# Patient Record
Sex: Male | Born: 1950
Health system: Southern US, Community
[De-identification: ages and names within clinical notes are randomized; demographics above are authoritative.]

## PROBLEM LIST (undated history)

## (undated) DIAGNOSIS — M1990 Unspecified osteoarthritis, unspecified site: Secondary | ICD-10-CM

## (undated) DIAGNOSIS — I10 Essential (primary) hypertension: Secondary | ICD-10-CM

## (undated) DIAGNOSIS — N189 Chronic kidney disease, unspecified: Secondary | ICD-10-CM

## (undated) HISTORY — PX: HAND LIGAMENT RECONSTRUCTION: SHX1726

## (undated) MED FILL — Fosaprepitant Dimeglumine For IV Infusion 150 MG (Base Eq): INTRAVENOUS | Qty: 5 | Status: AC

---

## 1996-08-29 DIAGNOSIS — I639 Cerebral infarction, unspecified: Secondary | ICD-10-CM

## 1996-08-29 HISTORY — DX: Cerebral infarction, unspecified: I63.9

## 2003-11-05 ENCOUNTER — Emergency Department (HOSPITAL_COMMUNITY): Admission: EM | Admit: 2003-11-05 | Discharge: 2003-11-05 | Payer: Self-pay | Admitting: Emergency Medicine

## 2018-02-06 DIAGNOSIS — N410 Acute prostatitis: Secondary | ICD-10-CM | POA: Diagnosis not present

## 2018-02-06 DIAGNOSIS — R300 Dysuria: Secondary | ICD-10-CM | POA: Diagnosis not present

## 2018-03-20 DIAGNOSIS — F5101 Primary insomnia: Secondary | ICD-10-CM | POA: Diagnosis not present

## 2018-03-20 DIAGNOSIS — N183 Chronic kidney disease, stage 3 (moderate): Secondary | ICD-10-CM | POA: Diagnosis not present

## 2018-03-20 DIAGNOSIS — E7800 Pure hypercholesterolemia, unspecified: Secondary | ICD-10-CM | POA: Diagnosis not present

## 2018-03-20 DIAGNOSIS — M19042 Primary osteoarthritis, left hand: Secondary | ICD-10-CM | POA: Diagnosis not present

## 2018-03-20 DIAGNOSIS — M19041 Primary osteoarthritis, right hand: Secondary | ICD-10-CM | POA: Diagnosis not present

## 2018-03-20 DIAGNOSIS — F419 Anxiety disorder, unspecified: Secondary | ICD-10-CM | POA: Diagnosis not present

## 2018-03-20 DIAGNOSIS — M10072 Idiopathic gout, left ankle and foot: Secondary | ICD-10-CM | POA: Diagnosis not present

## 2018-03-20 DIAGNOSIS — I10 Essential (primary) hypertension: Secondary | ICD-10-CM | POA: Diagnosis not present

## 2018-03-20 DIAGNOSIS — G8929 Other chronic pain: Secondary | ICD-10-CM | POA: Diagnosis not present

## 2018-05-16 DIAGNOSIS — M75122 Complete rotator cuff tear or rupture of left shoulder, not specified as traumatic: Secondary | ICD-10-CM | POA: Diagnosis not present

## 2018-06-13 DIAGNOSIS — G8929 Other chronic pain: Secondary | ICD-10-CM | POA: Diagnosis not present

## 2018-06-13 DIAGNOSIS — L739 Follicular disorder, unspecified: Secondary | ICD-10-CM | POA: Diagnosis not present

## 2018-06-13 DIAGNOSIS — F5101 Primary insomnia: Secondary | ICD-10-CM | POA: Diagnosis not present

## 2018-06-13 DIAGNOSIS — M545 Low back pain: Secondary | ICD-10-CM | POA: Diagnosis not present

## 2018-07-31 DIAGNOSIS — F5101 Primary insomnia: Secondary | ICD-10-CM | POA: Diagnosis not present

## 2018-07-31 DIAGNOSIS — I10 Essential (primary) hypertension: Secondary | ICD-10-CM | POA: Diagnosis not present

## 2018-08-29 HISTORY — PX: SHOULDER ARTHROSCOPY W/ ROTATOR CUFF REPAIR: SHX2400

## 2018-09-10 DIAGNOSIS — M7021 Olecranon bursitis, right elbow: Secondary | ICD-10-CM | POA: Diagnosis not present

## 2018-09-18 DIAGNOSIS — G8918 Other acute postprocedural pain: Secondary | ICD-10-CM | POA: Diagnosis not present

## 2018-09-18 DIAGNOSIS — M75122 Complete rotator cuff tear or rupture of left shoulder, not specified as traumatic: Secondary | ICD-10-CM | POA: Diagnosis not present

## 2018-09-18 DIAGNOSIS — M7542 Impingement syndrome of left shoulder: Secondary | ICD-10-CM | POA: Diagnosis not present

## 2018-09-18 DIAGNOSIS — S46192A Other injury of muscle, fascia and tendon of long head of biceps, left arm, initial encounter: Secondary | ICD-10-CM | POA: Diagnosis not present

## 2018-09-26 DIAGNOSIS — Z9889 Other specified postprocedural states: Secondary | ICD-10-CM | POA: Diagnosis not present

## 2018-09-26 DIAGNOSIS — M7542 Impingement syndrome of left shoulder: Secondary | ICD-10-CM | POA: Diagnosis not present

## 2018-09-27 DIAGNOSIS — G8929 Other chronic pain: Secondary | ICD-10-CM | POA: Diagnosis not present

## 2018-09-27 DIAGNOSIS — L03311 Cellulitis of abdominal wall: Secondary | ICD-10-CM | POA: Diagnosis not present

## 2018-09-27 DIAGNOSIS — F5101 Primary insomnia: Secondary | ICD-10-CM | POA: Diagnosis not present

## 2018-09-27 DIAGNOSIS — F419 Anxiety disorder, unspecified: Secondary | ICD-10-CM | POA: Diagnosis not present

## 2018-09-27 DIAGNOSIS — Z1211 Encounter for screening for malignant neoplasm of colon: Secondary | ICD-10-CM | POA: Diagnosis not present

## 2018-09-27 DIAGNOSIS — I10 Essential (primary) hypertension: Secondary | ICD-10-CM | POA: Diagnosis not present

## 2018-09-27 DIAGNOSIS — N183 Chronic kidney disease, stage 3 (moderate): Secondary | ICD-10-CM | POA: Diagnosis not present

## 2018-09-28 DIAGNOSIS — M75122 Complete rotator cuff tear or rupture of left shoulder, not specified as traumatic: Secondary | ICD-10-CM | POA: Diagnosis not present

## 2018-09-28 DIAGNOSIS — M25612 Stiffness of left shoulder, not elsewhere classified: Secondary | ICD-10-CM | POA: Diagnosis not present

## 2018-09-29 DIAGNOSIS — Z1211 Encounter for screening for malignant neoplasm of colon: Secondary | ICD-10-CM | POA: Diagnosis not present

## 2018-10-01 DIAGNOSIS — M25612 Stiffness of left shoulder, not elsewhere classified: Secondary | ICD-10-CM | POA: Diagnosis not present

## 2018-10-01 DIAGNOSIS — M75122 Complete rotator cuff tear or rupture of left shoulder, not specified as traumatic: Secondary | ICD-10-CM | POA: Diagnosis not present

## 2018-10-03 DIAGNOSIS — M25612 Stiffness of left shoulder, not elsewhere classified: Secondary | ICD-10-CM | POA: Diagnosis not present

## 2018-10-03 DIAGNOSIS — M75122 Complete rotator cuff tear or rupture of left shoulder, not specified as traumatic: Secondary | ICD-10-CM | POA: Diagnosis not present

## 2018-10-09 DIAGNOSIS — M75122 Complete rotator cuff tear or rupture of left shoulder, not specified as traumatic: Secondary | ICD-10-CM | POA: Diagnosis not present

## 2018-10-09 DIAGNOSIS — M25612 Stiffness of left shoulder, not elsewhere classified: Secondary | ICD-10-CM | POA: Diagnosis not present

## 2018-10-11 DIAGNOSIS — M25612 Stiffness of left shoulder, not elsewhere classified: Secondary | ICD-10-CM | POA: Diagnosis not present

## 2018-10-11 DIAGNOSIS — M75122 Complete rotator cuff tear or rupture of left shoulder, not specified as traumatic: Secondary | ICD-10-CM | POA: Diagnosis not present

## 2018-10-15 DIAGNOSIS — M75122 Complete rotator cuff tear or rupture of left shoulder, not specified as traumatic: Secondary | ICD-10-CM | POA: Diagnosis not present

## 2018-10-15 DIAGNOSIS — M25612 Stiffness of left shoulder, not elsewhere classified: Secondary | ICD-10-CM | POA: Diagnosis not present

## 2018-10-17 DIAGNOSIS — M25612 Stiffness of left shoulder, not elsewhere classified: Secondary | ICD-10-CM | POA: Diagnosis not present

## 2018-10-17 DIAGNOSIS — M75122 Complete rotator cuff tear or rupture of left shoulder, not specified as traumatic: Secondary | ICD-10-CM | POA: Diagnosis not present

## 2018-10-23 DIAGNOSIS — M25612 Stiffness of left shoulder, not elsewhere classified: Secondary | ICD-10-CM | POA: Diagnosis not present

## 2018-10-23 DIAGNOSIS — M75122 Complete rotator cuff tear or rupture of left shoulder, not specified as traumatic: Secondary | ICD-10-CM | POA: Diagnosis not present

## 2018-10-25 DIAGNOSIS — M25612 Stiffness of left shoulder, not elsewhere classified: Secondary | ICD-10-CM | POA: Diagnosis not present

## 2018-10-25 DIAGNOSIS — M75122 Complete rotator cuff tear or rupture of left shoulder, not specified as traumatic: Secondary | ICD-10-CM | POA: Diagnosis not present

## 2018-10-30 DIAGNOSIS — M75122 Complete rotator cuff tear or rupture of left shoulder, not specified as traumatic: Secondary | ICD-10-CM | POA: Diagnosis not present

## 2018-10-30 DIAGNOSIS — M25612 Stiffness of left shoulder, not elsewhere classified: Secondary | ICD-10-CM | POA: Diagnosis not present

## 2018-10-31 DIAGNOSIS — M25612 Stiffness of left shoulder, not elsewhere classified: Secondary | ICD-10-CM | POA: Diagnosis not present

## 2018-11-01 DIAGNOSIS — M75122 Complete rotator cuff tear or rupture of left shoulder, not specified as traumatic: Secondary | ICD-10-CM | POA: Diagnosis not present

## 2018-11-01 DIAGNOSIS — M25612 Stiffness of left shoulder, not elsewhere classified: Secondary | ICD-10-CM | POA: Diagnosis not present

## 2018-11-06 DIAGNOSIS — M25612 Stiffness of left shoulder, not elsewhere classified: Secondary | ICD-10-CM | POA: Diagnosis not present

## 2018-11-06 DIAGNOSIS — M75122 Complete rotator cuff tear or rupture of left shoulder, not specified as traumatic: Secondary | ICD-10-CM | POA: Diagnosis not present

## 2018-11-08 DIAGNOSIS — M75122 Complete rotator cuff tear or rupture of left shoulder, not specified as traumatic: Secondary | ICD-10-CM | POA: Diagnosis not present

## 2018-11-08 DIAGNOSIS — M25612 Stiffness of left shoulder, not elsewhere classified: Secondary | ICD-10-CM | POA: Diagnosis not present

## 2018-11-13 DIAGNOSIS — M75122 Complete rotator cuff tear or rupture of left shoulder, not specified as traumatic: Secondary | ICD-10-CM | POA: Diagnosis not present

## 2018-11-13 DIAGNOSIS — M25612 Stiffness of left shoulder, not elsewhere classified: Secondary | ICD-10-CM | POA: Diagnosis not present

## 2018-11-15 DIAGNOSIS — M75122 Complete rotator cuff tear or rupture of left shoulder, not specified as traumatic: Secondary | ICD-10-CM | POA: Diagnosis not present

## 2018-11-15 DIAGNOSIS — M25612 Stiffness of left shoulder, not elsewhere classified: Secondary | ICD-10-CM | POA: Diagnosis not present

## 2018-11-19 DIAGNOSIS — M75122 Complete rotator cuff tear or rupture of left shoulder, not specified as traumatic: Secondary | ICD-10-CM | POA: Diagnosis not present

## 2018-11-19 DIAGNOSIS — M25612 Stiffness of left shoulder, not elsewhere classified: Secondary | ICD-10-CM | POA: Diagnosis not present

## 2018-11-22 DIAGNOSIS — M25612 Stiffness of left shoulder, not elsewhere classified: Secondary | ICD-10-CM | POA: Diagnosis not present

## 2018-11-22 DIAGNOSIS — M75122 Complete rotator cuff tear or rupture of left shoulder, not specified as traumatic: Secondary | ICD-10-CM | POA: Diagnosis not present

## 2018-11-27 DIAGNOSIS — M75122 Complete rotator cuff tear or rupture of left shoulder, not specified as traumatic: Secondary | ICD-10-CM | POA: Diagnosis not present

## 2018-11-27 DIAGNOSIS — M25612 Stiffness of left shoulder, not elsewhere classified: Secondary | ICD-10-CM | POA: Diagnosis not present

## 2018-11-29 DIAGNOSIS — M25612 Stiffness of left shoulder, not elsewhere classified: Secondary | ICD-10-CM | POA: Diagnosis not present

## 2018-11-29 DIAGNOSIS — M75122 Complete rotator cuff tear or rupture of left shoulder, not specified as traumatic: Secondary | ICD-10-CM | POA: Diagnosis not present

## 2018-12-13 DIAGNOSIS — M75122 Complete rotator cuff tear or rupture of left shoulder, not specified as traumatic: Secondary | ICD-10-CM | POA: Diagnosis not present

## 2018-12-13 DIAGNOSIS — M25612 Stiffness of left shoulder, not elsewhere classified: Secondary | ICD-10-CM | POA: Diagnosis not present

## 2018-12-17 DIAGNOSIS — M75122 Complete rotator cuff tear or rupture of left shoulder, not specified as traumatic: Secondary | ICD-10-CM | POA: Diagnosis not present

## 2018-12-17 DIAGNOSIS — M25612 Stiffness of left shoulder, not elsewhere classified: Secondary | ICD-10-CM | POA: Diagnosis not present

## 2018-12-19 DIAGNOSIS — Z9889 Other specified postprocedural states: Secondary | ICD-10-CM | POA: Diagnosis not present

## 2019-03-29 DIAGNOSIS — E7800 Pure hypercholesterolemia, unspecified: Secondary | ICD-10-CM | POA: Diagnosis not present

## 2019-03-29 DIAGNOSIS — I10 Essential (primary) hypertension: Secondary | ICD-10-CM | POA: Diagnosis not present

## 2019-03-29 DIAGNOSIS — G8929 Other chronic pain: Secondary | ICD-10-CM | POA: Diagnosis not present

## 2019-03-29 DIAGNOSIS — Z1159 Encounter for screening for other viral diseases: Secondary | ICD-10-CM | POA: Diagnosis not present

## 2019-03-29 DIAGNOSIS — N183 Chronic kidney disease, stage 3 (moderate): Secondary | ICD-10-CM | POA: Diagnosis not present

## 2019-03-29 DIAGNOSIS — F419 Anxiety disorder, unspecified: Secondary | ICD-10-CM | POA: Diagnosis not present

## 2019-03-29 DIAGNOSIS — Z0000 Encounter for general adult medical examination without abnormal findings: Secondary | ICD-10-CM | POA: Diagnosis not present

## 2019-03-29 DIAGNOSIS — F5101 Primary insomnia: Secondary | ICD-10-CM | POA: Diagnosis not present

## 2019-03-29 DIAGNOSIS — Z125 Encounter for screening for malignant neoplasm of prostate: Secondary | ICD-10-CM | POA: Diagnosis not present

## 2019-04-08 DIAGNOSIS — E7800 Pure hypercholesterolemia, unspecified: Secondary | ICD-10-CM | POA: Diagnosis not present

## 2019-04-08 DIAGNOSIS — I10 Essential (primary) hypertension: Secondary | ICD-10-CM | POA: Diagnosis not present

## 2019-04-08 DIAGNOSIS — M19042 Primary osteoarthritis, left hand: Secondary | ICD-10-CM | POA: Diagnosis not present

## 2019-04-08 DIAGNOSIS — M19041 Primary osteoarthritis, right hand: Secondary | ICD-10-CM | POA: Diagnosis not present

## 2019-04-08 DIAGNOSIS — N183 Chronic kidney disease, stage 3 (moderate): Secondary | ICD-10-CM | POA: Diagnosis not present

## 2019-04-30 DIAGNOSIS — D7589 Other specified diseases of blood and blood-forming organs: Secondary | ICD-10-CM | POA: Diagnosis not present

## 2019-04-30 DIAGNOSIS — I951 Orthostatic hypotension: Secondary | ICD-10-CM | POA: Diagnosis not present

## 2019-04-30 DIAGNOSIS — R5383 Other fatigue: Secondary | ICD-10-CM | POA: Diagnosis not present

## 2019-04-30 DIAGNOSIS — R001 Bradycardia, unspecified: Secondary | ICD-10-CM | POA: Diagnosis not present

## 2019-07-02 DIAGNOSIS — R634 Abnormal weight loss: Secondary | ICD-10-CM | POA: Diagnosis not present

## 2019-07-02 DIAGNOSIS — N529 Male erectile dysfunction, unspecified: Secondary | ICD-10-CM | POA: Diagnosis not present

## 2019-08-03 DIAGNOSIS — J069 Acute upper respiratory infection, unspecified: Secondary | ICD-10-CM | POA: Diagnosis not present

## 2019-08-30 DIAGNOSIS — C649 Malignant neoplasm of unspecified kidney, except renal pelvis: Secondary | ICD-10-CM

## 2019-08-30 HISTORY — DX: Malignant neoplasm of unspecified kidney, except renal pelvis: C64.9

## 2019-09-03 DIAGNOSIS — M549 Dorsalgia, unspecified: Secondary | ICD-10-CM | POA: Diagnosis not present

## 2019-09-03 DIAGNOSIS — I10 Essential (primary) hypertension: Secondary | ICD-10-CM | POA: Diagnosis not present

## 2019-09-26 DIAGNOSIS — M545 Low back pain: Secondary | ICD-10-CM | POA: Diagnosis not present

## 2019-09-26 DIAGNOSIS — E7800 Pure hypercholesterolemia, unspecified: Secondary | ICD-10-CM | POA: Diagnosis not present

## 2019-09-26 DIAGNOSIS — N183 Chronic kidney disease, stage 3 unspecified: Secondary | ICD-10-CM | POA: Diagnosis not present

## 2019-09-26 DIAGNOSIS — M791 Myalgia, unspecified site: Secondary | ICD-10-CM | POA: Diagnosis not present

## 2019-09-26 DIAGNOSIS — I10 Essential (primary) hypertension: Secondary | ICD-10-CM | POA: Diagnosis not present

## 2019-09-26 DIAGNOSIS — F419 Anxiety disorder, unspecified: Secondary | ICD-10-CM | POA: Diagnosis not present

## 2019-10-03 DIAGNOSIS — M62830 Muscle spasm of back: Secondary | ICD-10-CM | POA: Diagnosis not present

## 2019-10-03 DIAGNOSIS — M6281 Muscle weakness (generalized): Secondary | ICD-10-CM | POA: Diagnosis not present

## 2019-10-07 DIAGNOSIS — M6281 Muscle weakness (generalized): Secondary | ICD-10-CM | POA: Diagnosis not present

## 2019-10-07 DIAGNOSIS — M62830 Muscle spasm of back: Secondary | ICD-10-CM | POA: Diagnosis not present

## 2019-10-10 DIAGNOSIS — M6281 Muscle weakness (generalized): Secondary | ICD-10-CM | POA: Diagnosis not present

## 2019-10-10 DIAGNOSIS — M62830 Muscle spasm of back: Secondary | ICD-10-CM | POA: Diagnosis not present

## 2019-10-14 DIAGNOSIS — M6281 Muscle weakness (generalized): Secondary | ICD-10-CM | POA: Diagnosis not present

## 2019-10-14 DIAGNOSIS — M62830 Muscle spasm of back: Secondary | ICD-10-CM | POA: Diagnosis not present

## 2019-10-22 DIAGNOSIS — M62830 Muscle spasm of back: Secondary | ICD-10-CM | POA: Diagnosis not present

## 2019-10-22 DIAGNOSIS — M6281 Muscle weakness (generalized): Secondary | ICD-10-CM | POA: Diagnosis not present

## 2019-10-24 DIAGNOSIS — M6281 Muscle weakness (generalized): Secondary | ICD-10-CM | POA: Diagnosis not present

## 2019-10-24 DIAGNOSIS — M62830 Muscle spasm of back: Secondary | ICD-10-CM | POA: Diagnosis not present

## 2019-10-30 DIAGNOSIS — M62830 Muscle spasm of back: Secondary | ICD-10-CM | POA: Diagnosis not present

## 2019-10-30 DIAGNOSIS — M6281 Muscle weakness (generalized): Secondary | ICD-10-CM | POA: Diagnosis not present

## 2019-11-01 DIAGNOSIS — M6281 Muscle weakness (generalized): Secondary | ICD-10-CM | POA: Diagnosis not present

## 2019-11-01 DIAGNOSIS — M62830 Muscle spasm of back: Secondary | ICD-10-CM | POA: Diagnosis not present

## 2020-02-11 DIAGNOSIS — M5416 Radiculopathy, lumbar region: Secondary | ICD-10-CM | POA: Diagnosis not present

## 2020-02-12 ENCOUNTER — Other Ambulatory Visit: Payer: Self-pay | Admitting: Family Medicine

## 2020-02-12 DIAGNOSIS — M5416 Radiculopathy, lumbar region: Secondary | ICD-10-CM

## 2020-02-20 ENCOUNTER — Other Ambulatory Visit: Payer: Self-pay

## 2020-02-20 ENCOUNTER — Ambulatory Visit
Admission: RE | Admit: 2020-02-20 | Discharge: 2020-02-20 | Disposition: A | Discharge status: Home / Self Care | Payer: PPO | Source: Ambulatory Visit | Attending: Family Medicine | Admitting: Family Medicine

## 2020-02-20 DIAGNOSIS — M5416 Radiculopathy, lumbar region: Secondary | ICD-10-CM

## 2020-02-20 DIAGNOSIS — M48061 Spinal stenosis, lumbar region without neurogenic claudication: Secondary | ICD-10-CM | POA: Diagnosis not present

## 2020-02-26 ENCOUNTER — Other Ambulatory Visit: Payer: Self-pay | Admitting: Family Medicine

## 2020-02-26 DIAGNOSIS — N2889 Other specified disorders of kidney and ureter: Secondary | ICD-10-CM

## 2020-03-21 ENCOUNTER — Ambulatory Visit
Admission: RE | Admit: 2020-03-21 | Discharge: 2020-03-21 | Disposition: A | Discharge status: Home / Self Care | Payer: PPO | Source: Ambulatory Visit | Attending: Family Medicine | Admitting: Family Medicine

## 2020-03-21 DIAGNOSIS — N281 Cyst of kidney, acquired: Secondary | ICD-10-CM | POA: Diagnosis not present

## 2020-03-21 DIAGNOSIS — N2889 Other specified disorders of kidney and ureter: Secondary | ICD-10-CM

## 2020-03-21 DIAGNOSIS — N261 Atrophy of kidney (terminal): Secondary | ICD-10-CM | POA: Diagnosis not present

## 2020-03-21 DIAGNOSIS — I700 Atherosclerosis of aorta: Secondary | ICD-10-CM | POA: Diagnosis not present

## 2020-03-21 MED ORDER — GADOBENATE DIMEGLUMINE 529 MG/ML IV SOLN
15.0000 mL | Freq: Once | INTRAVENOUS | Status: AC | PRN
  Administered 2020-03-21: 15 mL via INTRAVENOUS

## 2020-04-07 DIAGNOSIS — E7800 Pure hypercholesterolemia, unspecified: Secondary | ICD-10-CM | POA: Diagnosis not present

## 2020-04-07 DIAGNOSIS — N529 Male erectile dysfunction, unspecified: Secondary | ICD-10-CM | POA: Diagnosis not present

## 2020-04-07 DIAGNOSIS — M150 Primary generalized (osteo)arthritis: Secondary | ICD-10-CM | POA: Diagnosis not present

## 2020-04-07 DIAGNOSIS — N183 Chronic kidney disease, stage 3 unspecified: Secondary | ICD-10-CM | POA: Diagnosis not present

## 2020-04-07 DIAGNOSIS — F17200 Nicotine dependence, unspecified, uncomplicated: Secondary | ICD-10-CM | POA: Diagnosis not present

## 2020-04-07 DIAGNOSIS — I10 Essential (primary) hypertension: Secondary | ICD-10-CM | POA: Diagnosis not present

## 2020-04-07 DIAGNOSIS — N2889 Other specified disorders of kidney and ureter: Secondary | ICD-10-CM | POA: Diagnosis not present

## 2020-04-07 DIAGNOSIS — Z125 Encounter for screening for malignant neoplasm of prostate: Secondary | ICD-10-CM | POA: Diagnosis not present

## 2020-04-07 DIAGNOSIS — Z0000 Encounter for general adult medical examination without abnormal findings: Secondary | ICD-10-CM | POA: Diagnosis not present

## 2020-04-07 DIAGNOSIS — M545 Low back pain: Secondary | ICD-10-CM | POA: Diagnosis not present

## 2020-04-07 DIAGNOSIS — F419 Anxiety disorder, unspecified: Secondary | ICD-10-CM | POA: Diagnosis not present

## 2020-04-07 DIAGNOSIS — Z1211 Encounter for screening for malignant neoplasm of colon: Secondary | ICD-10-CM | POA: Diagnosis not present

## 2020-04-28 DIAGNOSIS — N5201 Erectile dysfunction due to arterial insufficiency: Secondary | ICD-10-CM | POA: Diagnosis not present

## 2020-04-28 DIAGNOSIS — C642 Malignant neoplasm of left kidney, except renal pelvis: Secondary | ICD-10-CM | POA: Diagnosis not present

## 2020-04-29 DIAGNOSIS — L237 Allergic contact dermatitis due to plants, except food: Secondary | ICD-10-CM | POA: Diagnosis not present

## 2020-05-08 ENCOUNTER — Other Ambulatory Visit: Payer: Self-pay | Admitting: Urology

## 2020-06-22 DIAGNOSIS — C642 Malignant neoplasm of left kidney, except renal pelvis: Secondary | ICD-10-CM | POA: Diagnosis not present

## 2020-06-22 DIAGNOSIS — N183 Chronic kidney disease, stage 3 unspecified: Secondary | ICD-10-CM | POA: Diagnosis not present

## 2020-06-22 DIAGNOSIS — N5201 Erectile dysfunction due to arterial insufficiency: Secondary | ICD-10-CM | POA: Diagnosis not present

## 2020-07-08 NOTE — Patient Instructions (Addendum)
DUE TO COVID-19 ONLY ONE VISITOR IS ALLOWED TO COME WITH YOU AND STAY IN THE WAITING ROOM ONLY DURING PRE OP AND PROCEDURE DAY OF SURGERY. THE 1 VISITOR  MAY VISIT WITH YOU AFTER SURGERY IN YOUR PRIVATE ROOM DURING VISITING HOURS ONLY!  YOU NEED TO HAVE A COVID 19 TEST ON_11/15_____ @_8 :00______, THIS TEST MUST BE DONE BEFORE SURGERY,  COVID TESTING SITE [place] [address], IT IS ON THE RIGHT GOING OUT [address] APPROXIMATELY  2 MINUTES PAST [employer] ON THE RIGHT. ONCE YOUR COVID TEST IS COMPLETED,  PLEASE BEGIN THE QUARANTINE INSTRUCTIONS AS OUTLINED IN YOUR HANDOUT.                BYRANT VALENT    Your procedure is scheduled on: 07/15/20   Report to [facility] Main  Entrance   Report to admitting at  6:30 AM     Call this number if you have problems the morning of surgery [phone]    Remember: Do not eat food or drink liquids :After Midnight  . BRUSH YOUR TEETH MORNING OF SURGERY AND RINSE YOUR MOUTH OUT, NO CHEWING GUM CANDY OR MINTS.     Take these medicines the morning of surgery with A SIP OF WATER: Gabapentin                                 You may not have any metal on your body including              piercings  Do not wear jewelry,  lotions, powders or deodorant                          Men may shave face and neck.   Do not bring valuables to the hospital. [place].  Contacts, dentures or bridgework may not be worn into surgery.               Please read over the following fact sheets you were given: _____________________________________________________________________             [facility] - Preparing for Surgery Before surgery, you can play an important role .  Because skin is not sterile, your skin needs to be as free of germs as possible.   You can reduce the number of germs on your skin by washing with CHG (chlorahexidine gluconate) soap before  surgery .  CHG is an antiseptic cleaner which kills germs and bonds with the skin to continue killing germs even after washing. Please DO NOT use if you have an allergy to CHG or antibacterial soaps.   If your skin becomes reddened/irritated stop using the CHG and inform your nurse when you arrive at Short Stay. .  You may shave your face/neck.  Please follow these instructions carefully:  1.  Shower with CHG Soap the night before surgery and the  morning of Surgery.  2.  If you choose to wash your hair, wash your hair first as usual with your  normal  shampoo.  3.  After you shampoo, rinse your hair and body thoroughly to remove the  shampoo.  4.  Use CHG as you would any other liquid soap.  You can apply chg directly  to the skin and wash                       Gently with a scrungie or clean washcloth.  5.  Apply the CHG Soap to your body ONLY FROM THE NECK DOWN.   Do not use on face/ open                           Wound or open sores. Avoid contact with eyes, ears mouth and genitals (private parts).                       Wash face,  Genitals (private parts) with your normal soap.             6.  Wash thoroughly, paying special attention to the area where your surgery  will be performed.  7.  Thoroughly rinse your body with warm water from the neck down.  8.  DO NOT shower/wash with your normal soap after using and rinsing off  the CHG Soap.             9.  Pat yourself dry with a clean towel.            10.  Wear clean pajamas.            11.  Place clean sheets on your bed the night of your first shower and do not  sleep with pets. Day of Surgery : Do not apply any lotions/deodorants the morning of surgery.  Please wear clean clothes to the hospital/surgery center.  FAILURE TO FOLLOW THESE INSTRUCTIONS MAY RESULT IN THE CANCELLATION OF YOUR SURGERY PATIENT SIGNATURE_________________________________  NURSE  SIGNATURE__________________________________  ________________________________________________________________________

## 2020-07-09 ENCOUNTER — Ambulatory Visit (HOSPITAL_COMMUNITY)
Admission: RE | Admit: 2020-07-09 | Discharge: 2020-07-09 | Disposition: A | Discharge status: Home / Self Care | Payer: PPO | Source: Ambulatory Visit | Attending: Urology | Admitting: Urology

## 2020-07-09 ENCOUNTER — Encounter (HOSPITAL_COMMUNITY): Payer: Self-pay

## 2020-07-09 ENCOUNTER — Other Ambulatory Visit: Payer: Self-pay

## 2020-07-09 ENCOUNTER — Encounter (HOSPITAL_COMMUNITY)
Admission: RE | Admit: 2020-07-09 | Discharge: 2020-07-09 | Disposition: A | Discharge status: Home / Self Care | Payer: PPO | Source: Ambulatory Visit | Attending: Urology | Admitting: Urology

## 2020-07-09 DIAGNOSIS — F17200 Nicotine dependence, unspecified, uncomplicated: Secondary | ICD-10-CM | POA: Insufficient documentation

## 2020-07-09 DIAGNOSIS — Z01818 Encounter for other preprocedural examination: Secondary | ICD-10-CM | POA: Diagnosis not present

## 2020-07-09 HISTORY — DX: Chronic kidney disease, unspecified: N18.9

## 2020-07-09 HISTORY — DX: Unspecified osteoarthritis, unspecified site: M19.90

## 2020-07-09 HISTORY — DX: Essential (primary) hypertension: I10

## 2020-07-09 LAB — CBC
HCT: 43.7 % (ref 39.0–52.0)
Hemoglobin: 14.8 g/dL (ref 13.0–17.0)
MCH: 34.3 pg — ABNORMAL HIGH (ref 26.0–34.0)
MCHC: 33.9 g/dL (ref 30.0–36.0)
MCV: 101.2 fL — ABNORMAL HIGH (ref 80.0–100.0)
Platelets: 305 10*3/uL (ref 150–400)
RBC: 4.32 MIL/uL (ref 4.22–5.81)
RDW: 12.8 % (ref 11.5–15.5)
WBC: 11.4 10*3/uL — ABNORMAL HIGH (ref 4.0–10.5)
nRBC: 0 % (ref 0.0–0.2)

## 2020-07-09 LAB — BASIC METABOLIC PANEL
Anion gap: 9 (ref 5–15)
BUN: 22 mg/dL (ref 8–23)
CO2: 28 mmol/L (ref 22–32)
Calcium: 9.9 mg/dL (ref 8.9–10.3)
Chloride: 98 mmol/L (ref 98–111)
Creatinine, Ser: 1.4 mg/dL — ABNORMAL HIGH (ref 0.61–1.24)
GFR, Estimated: 55 mL/min — ABNORMAL LOW (ref >60)
Glucose, Bld: 94 mg/dL (ref 70–99)
Potassium: 3.6 mmol/L (ref 3.5–5.1)
Sodium: 135 mmol/L (ref 135–145)

## 2020-07-09 NOTE — Progress Notes (Signed)
COVID Vaccine Completed:Yes Date COVID Vaccine completed:11/11/19 COVID vaccine manufacturer: [employer] [place]    PCP - Dr. Earney Mallet Cardiologist - none  Chest x-ray - 07/09/20-Epic EKG - 07/09/20- Epic and Chart Stress Test - no ECHO - no Cardiac Cath - no Pacemaker/ICD device last checked:no  Sleep Study - no CPAP -   Fasting Blood Sugar - NA Checks Blood Sugar _____ times a day  Blood Thinner Instructions:ASA/Dr. Kenton Kingfisher Aspirin Instructions:stop 5 days prior to DOS/ Tresa Moore Last Dose:11/11  Anesthesia review:   Patient denies shortness of breath, fever, cough and chest pain at PAT appointment  yes   Patient verbalized understanding of instructions that were given to them at the PAT appointment. Patient was also instructed that they will need to review over the PAT instructions again at home before surgery.yes Pt is a long time smoker of 1.5 pack per day.He has a physical job and has no SOB while working or with ADLs

## 2020-07-13 ENCOUNTER — Other Ambulatory Visit (HOSPITAL_COMMUNITY)
Admission: RE | Admit: 2020-07-13 | Discharge: 2020-07-13 | Disposition: A | Discharge status: Home / Self Care | Payer: PPO | Source: Ambulatory Visit | Attending: Urology | Admitting: Urology

## 2020-07-13 DIAGNOSIS — Z01812 Encounter for preprocedural laboratory examination: Secondary | ICD-10-CM | POA: Diagnosis not present

## 2020-07-13 DIAGNOSIS — Z20822 Contact with and (suspected) exposure to covid-19: Secondary | ICD-10-CM | POA: Insufficient documentation

## 2020-07-13 LAB — SARS CORONAVIRUS 2 (TAT 6-24 HRS): SARS Coronavirus 2: NEGATIVE

## 2020-07-14 NOTE — Anesthesia Preprocedure Evaluation (Addendum)
Anesthesia Evaluation  Patient identified by MRN, date of birth, ID band Patient awake    Reviewed: Allergy & Precautions, NPO status , Patient's Chart, lab work & pertinent test results  History of Anesthesia Complications Negative for: history of anesthetic complications  Airway Mallampati: I  TM Distance: >3 FB Neck ROM: Full    Dental  (+) Upper Dentures, Lower Dentures, Dental Advisory Given   Pulmonary Current Smoker,    Pulmonary exam normal        Cardiovascular hypertension, Pt. on home beta blockers Normal cardiovascular exam     Neuro/Psych CVA    GI/Hepatic negative GI ROS, Neg liver ROS,   Endo/Other  negative endocrine ROS  Renal/GU      Musculoskeletal negative musculoskeletal ROS (+)   Abdominal   Peds  Hematology negative hematology ROS (+)   Anesthesia Other Findings   Reproductive/Obstetrics                            Anesthesia Physical Anesthesia Plan  ASA: III  Anesthesia Plan: General   Post-op Pain Management:    Induction: Intravenous  PONV Risk Score and Plan: 3 and Ondansetron, Dexamethasone and Midazolam  Airway Management Planned: Oral ETT  Additional Equipment:   Intra-op Plan:   Post-operative Plan: Extubation in OR  Informed Consent: I have reviewed the patients History and Physical, chart, labs and discussed the procedure including the risks, benefits and alternatives for the proposed anesthesia with the patient or authorized representative who has indicated his/her understanding and acceptance.     Dental advisory given  Plan Discussed with: Anesthesiologist and CRNA  Anesthesia Plan Comments:        Anesthesia Quick Evaluation

## 2020-07-15 ENCOUNTER — Ambulatory Visit (HOSPITAL_COMMUNITY): Payer: PPO | Admitting: Anesthesiology

## 2020-07-15 ENCOUNTER — Encounter (HOSPITAL_COMMUNITY)
Admission: RE | Disposition: A | Discharge status: Home / Self Care | Payer: Self-pay | Source: Home / Self Care | Attending: Urology

## 2020-07-15 ENCOUNTER — Encounter (HOSPITAL_COMMUNITY): Payer: Self-pay | Admitting: Urology

## 2020-07-15 ENCOUNTER — Ambulatory Visit (HOSPITAL_COMMUNITY): Payer: PPO | Admitting: Physician Assistant

## 2020-07-15 ENCOUNTER — Inpatient Hospital Stay (HOSPITAL_COMMUNITY)
Admission: RE | Admit: 2020-07-15 | Discharge: 2020-07-16 | DRG: 658 | Disposition: A | Discharge status: Home / Self Care | Payer: PPO | Attending: Urology | Admitting: Urology

## 2020-07-15 ENCOUNTER — Other Ambulatory Visit: Payer: Self-pay

## 2020-07-15 DIAGNOSIS — Z20822 Contact with and (suspected) exposure to covid-19: Secondary | ICD-10-CM | POA: Diagnosis not present

## 2020-07-15 DIAGNOSIS — N289 Disorder of kidney and ureter, unspecified: Secondary | ICD-10-CM | POA: Diagnosis not present

## 2020-07-15 DIAGNOSIS — N189 Chronic kidney disease, unspecified: Secondary | ICD-10-CM | POA: Insufficient documentation

## 2020-07-15 DIAGNOSIS — I129 Hypertensive chronic kidney disease with stage 1 through stage 4 chronic kidney disease, or unspecified chronic kidney disease: Secondary | ICD-10-CM | POA: Diagnosis present

## 2020-07-15 DIAGNOSIS — Z8673 Personal history of transient ischemic attack (TIA), and cerebral infarction without residual deficits: Secondary | ICD-10-CM | POA: Diagnosis not present

## 2020-07-15 DIAGNOSIS — F17210 Nicotine dependence, cigarettes, uncomplicated: Secondary | ICD-10-CM | POA: Diagnosis not present

## 2020-07-15 DIAGNOSIS — M1990 Unspecified osteoarthritis, unspecified site: Secondary | ICD-10-CM | POA: Diagnosis not present

## 2020-07-15 DIAGNOSIS — C642 Malignant neoplasm of left kidney, except renal pelvis: Principal | ICD-10-CM | POA: Diagnosis present

## 2020-07-15 DIAGNOSIS — N183 Chronic kidney disease, stage 3 unspecified: Secondary | ICD-10-CM | POA: Diagnosis present

## 2020-07-15 DIAGNOSIS — N2889 Other specified disorders of kidney and ureter: Secondary | ICD-10-CM | POA: Diagnosis not present

## 2020-07-15 DIAGNOSIS — D3002 Benign neoplasm of left kidney: Secondary | ICD-10-CM | POA: Diagnosis not present

## 2020-07-15 DIAGNOSIS — I10 Essential (primary) hypertension: Secondary | ICD-10-CM | POA: Diagnosis not present

## 2020-07-15 HISTORY — PX: ROBOTIC ASSITED PARTIAL NEPHRECTOMY: SHX6087

## 2020-07-15 LAB — BASIC METABOLIC PANEL
Anion gap: 16 — ABNORMAL HIGH (ref 5–15)
BUN: 23 mg/dL (ref 8–23)
CO2: 20 mmol/L — ABNORMAL LOW (ref 22–32)
Calcium: 8.8 mg/dL — ABNORMAL LOW (ref 8.9–10.3)
Chloride: 95 mmol/L — ABNORMAL LOW (ref 98–111)
Creatinine, Ser: 1.68 mg/dL — ABNORMAL HIGH (ref 0.61–1.24)
GFR, Estimated: 44 mL/min — ABNORMAL LOW (ref >60)
Glucose, Bld: 95 mg/dL (ref 70–99)
Potassium: 3.5 mmol/L (ref 3.5–5.1)
Sodium: 131 mmol/L — ABNORMAL LOW (ref 135–145)

## 2020-07-15 LAB — TYPE AND SCREEN
ABO/RH(D): O POS
Antibody Screen: NEGATIVE

## 2020-07-15 LAB — HEMOGLOBIN AND HEMATOCRIT, BLOOD
HCT: 39.8 % (ref 39.0–52.0)
Hemoglobin: 13.7 g/dL (ref 13.0–17.0)

## 2020-07-15 LAB — ABO/RH: ABO/RH(D): O POS

## 2020-07-15 SURGERY — NEPHRECTOMY, PARTIAL, ROBOT-ASSISTED
Anesthesia: General | Laterality: Left

## 2020-07-15 MED ORDER — PROPOFOL 10 MG/ML IV BOLUS
INTRAVENOUS | Status: DC | PRN
  Administered 2020-07-15: 20 mg via INTRAVENOUS
  Administered 2020-07-15: 50 mg via INTRAVENOUS
  Administered 2020-07-15: 150 mg via INTRAVENOUS

## 2020-07-15 MED ORDER — CHLORHEXIDINE GLUCONATE 0.12 % MT SOLN
15.0000 mL | Freq: Once | OROMUCOSAL | Status: AC
  Administered 2020-07-15: 15 mL via OROMUCOSAL

## 2020-07-15 MED ORDER — SUGAMMADEX SODIUM 500 MG/5ML IV SOLN
INTRAVENOUS | Status: AC
  Filled 2020-07-15: qty 5

## 2020-07-15 MED ORDER — LACTATED RINGERS IV SOLN: INTRAVENOUS | Status: DC

## 2020-07-15 MED ORDER — ORAL CARE MOUTH RINSE: 15.0000 mL | Freq: Once | OROMUCOSAL | Status: AC

## 2020-07-15 MED ORDER — HYDROMORPHONE HCL 1 MG/ML IJ SOLN
0.5000 mg | INTRAMUSCULAR | Status: DC | PRN
  Administered 2020-07-15: 0.5 mg via INTRAVENOUS

## 2020-07-15 MED ORDER — MIDAZOLAM HCL 2 MG/2ML IJ SOLN
INTRAMUSCULAR | Status: AC
  Filled 2020-07-15: qty 2

## 2020-07-15 MED ORDER — DEXMEDETOMIDINE (PRECEDEX) IN NS 20 MCG/5ML (4 MCG/ML) IV SYRINGE
PREFILLED_SYRINGE | INTRAVENOUS | Status: AC
  Filled 2020-07-15: qty 5

## 2020-07-15 MED ORDER — MIDAZOLAM HCL 5 MG/5ML IJ SOLN
INTRAMUSCULAR | Status: DC | PRN
  Administered 2020-07-15: 2 mg via INTRAVENOUS

## 2020-07-15 MED ORDER — CHLORHEXIDINE GLUCONATE CLOTH 2 % EX PADS
6.0000 | MEDICATED_PAD | Freq: Every day | CUTANEOUS | Status: DC
  Administered 2020-07-16: 6 via TOPICAL

## 2020-07-15 MED ORDER — PROPOFOL 10 MG/ML IV BOLUS
INTRAVENOUS | Status: AC
  Filled 2020-07-15: qty 40

## 2020-07-15 MED ORDER — EPHEDRINE 5 MG/ML INJ
INTRAVENOUS | Status: AC
  Filled 2020-07-15: qty 10

## 2020-07-15 MED ORDER — ONDANSETRON HCL 4 MG/2ML IJ SOLN
INTRAMUSCULAR | Status: AC
  Filled 2020-07-15: qty 2

## 2020-07-15 MED ORDER — PHENYLEPHRINE HCL-NACL 10-0.9 MG/250ML-% IV SOLN: INTRAVENOUS | Status: DC | PRN

## 2020-07-15 MED ORDER — ALBUMIN HUMAN 5 % IV SOLN: INTRAVENOUS | Status: DC | PRN

## 2020-07-15 MED ORDER — CEFAZOLIN SODIUM-DEXTROSE 2-4 GM/100ML-% IV SOLN
2.0000 g | INTRAVENOUS | Status: AC
  Administered 2020-07-15: 2 g via INTRAVENOUS
  Filled 2020-07-15: qty 100

## 2020-07-15 MED ORDER — FENTANYL CITRATE (PF) 250 MCG/5ML IJ SOLN
INTRAMUSCULAR | Status: AC
  Filled 2020-07-15: qty 5

## 2020-07-15 MED ORDER — ALPRAZOLAM 1 MG PO TABS
1.0000 mg | ORAL_TABLET | Freq: Every day | ORAL | Status: DC
  Administered 2020-07-15: 1 mg via ORAL
  Filled 2020-07-15: qty 1

## 2020-07-15 MED ORDER — DOCUSATE SODIUM 100 MG PO CAPS: 100.0000 mg | ORAL_CAPSULE | Freq: Two times a day (BID) | ORAL | Status: DC

## 2020-07-15 MED ORDER — SODIUM CHLORIDE (PF) 0.9 % IJ SOLN
INTRAMUSCULAR | Status: DC | PRN
  Administered 2020-07-15: 20 mL

## 2020-07-15 MED ORDER — GLYCOPYRROLATE 0.2 MG/ML IJ SOLN
INTRAMUSCULAR | Status: DC | PRN
  Administered 2020-07-15: .1 mg via INTRAVENOUS

## 2020-07-15 MED ORDER — ONDANSETRON HCL 4 MG/2ML IJ SOLN: 4.0000 mg | INTRAMUSCULAR | Status: DC | PRN

## 2020-07-15 MED ORDER — PROMETHAZINE HCL 25 MG/ML IJ SOLN: 6.2500 mg | INTRAMUSCULAR | Status: DC | PRN

## 2020-07-15 MED ORDER — FENTANYL CITRATE (PF) 100 MCG/2ML IJ SOLN
INTRAMUSCULAR | Status: AC
  Administered 2020-07-15: 50 ug via INTRAVENOUS
  Filled 2020-07-15: qty 2

## 2020-07-15 MED ORDER — SODIUM CHLORIDE (PF) 0.9 % IJ SOLN
INTRAMUSCULAR | Status: AC
  Filled 2020-07-15: qty 20

## 2020-07-15 MED ORDER — PROPOFOL 10 MG/ML IV BOLUS
INTRAVENOUS | Status: AC
  Filled 2020-07-15: qty 20

## 2020-07-15 MED ORDER — HYDROMORPHONE HCL 1 MG/ML IJ SOLN
INTRAMUSCULAR | Status: AC
  Filled 2020-07-15: qty 1

## 2020-07-15 MED ORDER — FENTANYL CITRATE (PF) 100 MCG/2ML IJ SOLN
INTRAMUSCULAR | Status: AC
  Filled 2020-07-15: qty 2

## 2020-07-15 MED ORDER — ROCURONIUM BROMIDE 100 MG/10ML IV SOLN
INTRAVENOUS | Status: DC | PRN
  Administered 2020-07-15: 100 mg via INTRAVENOUS

## 2020-07-15 MED ORDER — LIDOCAINE 2% (20 MG/ML) 5 ML SYRINGE
INTRAMUSCULAR | Status: AC
  Filled 2020-07-15: qty 5

## 2020-07-15 MED ORDER — LACTATED RINGERS IR SOLN
Status: DC | PRN
  Administered 2020-07-15: 1000 mL

## 2020-07-15 MED ORDER — ACETAMINOPHEN 500 MG PO TABS
1000.0000 mg | ORAL_TABLET | Freq: Once | ORAL | Status: AC
  Administered 2020-07-15: 1000 mg via ORAL
  Filled 2020-07-15: qty 2

## 2020-07-15 MED ORDER — DOCUSATE SODIUM 100 MG PO CAPS
100.0000 mg | ORAL_CAPSULE | Freq: Two times a day (BID) | ORAL | Status: DC
  Administered 2020-07-15 – 2020-07-16 (×2): 100 mg via ORAL
  Filled 2020-07-15 (×2): qty 1

## 2020-07-15 MED ORDER — LIDOCAINE HCL (CARDIAC) PF 100 MG/5ML IV SOSY
PREFILLED_SYRINGE | INTRAVENOUS | Status: DC | PRN
  Administered 2020-07-15: 100 mg via INTRAVENOUS

## 2020-07-15 MED ORDER — PHENYLEPHRINE HCL (PRESSORS) 10 MG/ML IV SOLN
INTRAVENOUS | Status: AC
  Filled 2020-07-15: qty 1

## 2020-07-15 MED ORDER — OXYCODONE HCL 5 MG PO TABS
5.0000 mg | ORAL_TABLET | ORAL | Status: DC | PRN
  Administered 2020-07-15 – 2020-07-16 (×5): 5 mg via ORAL
  Filled 2020-07-15 (×5): qty 1

## 2020-07-15 MED ORDER — LACTATED RINGERS IV SOLN: INTRAVENOUS | Status: DC | PRN

## 2020-07-15 MED ORDER — GLYCOPYRROLATE PF 0.2 MG/ML IJ SOSY
PREFILLED_SYRINGE | INTRAMUSCULAR | Status: AC
  Filled 2020-07-15: qty 1

## 2020-07-15 MED ORDER — MAGNESIUM CITRATE PO SOLN
1.0000 | Freq: Once | ORAL | Status: DC
  Filled 2020-07-15: qty 296

## 2020-07-15 MED ORDER — FENTANYL CITRATE (PF) 100 MCG/2ML IJ SOLN
25.0000 ug | INTRAMUSCULAR | Status: DC | PRN
  Administered 2020-07-15: 50 ug via INTRAVENOUS

## 2020-07-15 MED ORDER — DEXAMETHASONE SODIUM PHOSPHATE 10 MG/ML IJ SOLN
INTRAMUSCULAR | Status: DC | PRN
  Administered 2020-07-15: 8 mg via INTRAVENOUS

## 2020-07-15 MED ORDER — HYDROMORPHONE HCL 1 MG/ML IJ SOLN: 0.5000 mg | INTRAMUSCULAR | Status: DC | PRN

## 2020-07-15 MED ORDER — ACETAMINOPHEN 325 MG PO TABS: 650.0000 mg | ORAL_TABLET | ORAL | Status: DC | PRN

## 2020-07-15 MED ORDER — SUGAMMADEX SODIUM 200 MG/2ML IV SOLN
INTRAVENOUS | Status: DC | PRN
  Administered 2020-07-15: 50 mg via INTRAVENOUS
  Administered 2020-07-15: 100 mg via INTRAVENOUS
  Administered 2020-07-15 (×2): 50 mg via INTRAVENOUS

## 2020-07-15 MED ORDER — PHENYLEPHRINE HCL (PRESSORS) 10 MG/ML IV SOLN
INTRAVENOUS | Status: DC | PRN
  Administered 2020-07-15 (×4): 40 ug via INTRAVENOUS

## 2020-07-15 MED ORDER — BUPIVACAINE LIPOSOME 1.3 % IJ SUSP
20.0000 mL | Freq: Once | INTRAMUSCULAR | Status: AC
  Administered 2020-07-15: 20 mL
  Filled 2020-07-15: qty 20

## 2020-07-15 MED ORDER — DEXAMETHASONE SODIUM PHOSPHATE 10 MG/ML IJ SOLN
INTRAMUSCULAR | Status: AC
  Filled 2020-07-15: qty 1

## 2020-07-15 MED ORDER — SENNOSIDES-DOCUSATE SODIUM 8.6-50 MG PO TABS
2.0000 | ORAL_TABLET | Freq: Every day | ORAL | Status: DC
  Administered 2020-07-15: 2 via ORAL
  Filled 2020-07-15: qty 2

## 2020-07-15 MED ORDER — EPHEDRINE SULFATE 50 MG/ML IJ SOLN
INTRAMUSCULAR | Status: DC | PRN
  Administered 2020-07-15 (×3): 5 mg via INTRAVENOUS

## 2020-07-15 MED ORDER — HYDROCODONE-ACETAMINOPHEN 5-325 MG PO TABS
1.0000 | ORAL_TABLET | Freq: Four times a day (QID) | ORAL | 0 refills | Status: DC | PRN
Start: 2020-07-15 — End: 2021-10-13

## 2020-07-15 MED ORDER — FENTANYL CITRATE (PF) 100 MCG/2ML IJ SOLN
INTRAMUSCULAR | Status: DC | PRN
  Administered 2020-07-15: 50 ug via INTRAVENOUS
  Administered 2020-07-15: 25 ug via INTRAVENOUS
  Administered 2020-07-15: 100 ug via INTRAVENOUS
  Administered 2020-07-15: 25 ug via INTRAVENOUS
  Administered 2020-07-15: 50 ug via INTRAVENOUS

## 2020-07-15 SURGICAL SUPPLY — 67 items
APPLICATOR SURGIFLO ENDO (HEMOSTASIS) IMPLANT
BAG LAPAROSCOPIC 12 15 PORT 16 (BASKET) ×1 IMPLANT
BAG RETRIEVAL 12/15 (BASKET) ×2
CHLORAPREP W/TINT 26 (MISCELLANEOUS) ×2 IMPLANT
CLIP SUT LAPRA TY ABSORB (SUTURE) ×4 IMPLANT
CLIP VESOLOCK LG 6/CT PURPLE (CLIP) ×2 IMPLANT
CLIP VESOLOCK MED LG 6/CT (CLIP) ×4 IMPLANT
CLIP VESOLOCK XL 6/CT (CLIP) ×2 IMPLANT
COVER SURGICAL LIGHT HANDLE (MISCELLANEOUS) ×2 IMPLANT
COVER TIP SHEARS 8 DVNC (MISCELLANEOUS) ×1 IMPLANT
COVER TIP SHEARS 8MM DA VINCI (MISCELLANEOUS) ×2
COVER WAND RF STERILE (DRAPES) IMPLANT
CUTTER ECHEON FLEX ENDO 45 340 (ENDOMECHANICALS) ×2 IMPLANT
DECANTER SPIKE VIAL GLASS SM (MISCELLANEOUS) ×2 IMPLANT
DERMABOND ADVANCED (GAUZE/BANDAGES/DRESSINGS) ×1
DERMABOND ADVANCED .7 DNX12 (GAUZE/BANDAGES/DRESSINGS) ×1 IMPLANT
DRAIN CHANNEL 15F RND FF 3/16 (WOUND CARE) IMPLANT
DRAPE ARM DVNC X/XI (DISPOSABLE) ×4 IMPLANT
DRAPE COLUMN DVNC XI (DISPOSABLE) ×1 IMPLANT
DRAPE DA VINCI XI ARM (DISPOSABLE) ×8
DRAPE DA VINCI XI COLUMN (DISPOSABLE) ×2
DRAPE INCISE IOBAN 66X45 STRL (DRAPES) ×2 IMPLANT
DRAPE SHEET LG 3/4 BI-LAMINATE (DRAPES) ×2 IMPLANT
ELECT PENCIL ROCKER SW 15FT (MISCELLANEOUS) ×2 IMPLANT
ELECT REM PT RETURN 15FT ADLT (MISCELLANEOUS) ×2 IMPLANT
EVACUATOR SILICONE 100CC (DRAIN) IMPLANT
GLOVE BIO SURGEON STRL SZ 6.5 (GLOVE) ×2 IMPLANT
GLOVE BIOGEL M STRL SZ7.5 (GLOVE) ×4 IMPLANT
GOWN STRL REUS W/TWL LRG LVL3 (GOWN DISPOSABLE) ×6 IMPLANT
HEMOSTAT SURGICEL 4X8 (HEMOSTASIS) ×2 IMPLANT
IRRIG SUCT STRYKERFLOW 2 WTIP (MISCELLANEOUS) ×2
IRRIGATION SUCT STRKRFLW 2 WTP (MISCELLANEOUS) ×1 IMPLANT
KIT BASIN OR (CUSTOM PROCEDURE TRAY) ×2 IMPLANT
KIT TURNOVER KIT A (KITS) IMPLANT
LOOP VESSEL MAXI BLUE (MISCELLANEOUS) ×2 IMPLANT
MARKER SKIN DUAL TIP RULER LAB (MISCELLANEOUS) ×2 IMPLANT
NEEDLE INSUFFLATION 14GA 120MM (NEEDLE) ×2 IMPLANT
PORT ACCESS TROCAR AIRSEAL 12 (TROCAR) ×1 IMPLANT
PORT ACCESS TROCAR AIRSEAL 5M (TROCAR) ×1
POUCH SPECIMEN RETRIEVAL 10MM (ENDOMECHANICALS) IMPLANT
PROTECTOR NERVE ULNAR (MISCELLANEOUS) ×4 IMPLANT
SEAL CANN UNIV 5-8 DVNC XI (MISCELLANEOUS) ×4 IMPLANT
SEAL XI 5MM-8MM UNIVERSAL (MISCELLANEOUS) ×8
SET TRI-LUMEN FLTR TB AIRSEAL (TUBING) ×2 IMPLANT
SOLUTION ELECTROLUBE (MISCELLANEOUS) ×2 IMPLANT
SPONGE LAP 4X18 RFD (DISPOSABLE) ×2 IMPLANT
STAPLE RELOAD 45 WHT (STAPLE) ×2 IMPLANT
STAPLE RELOAD 45MM WHITE (STAPLE) ×4
SURGIFLO W/THROMBIN 8M KIT (HEMOSTASIS) ×2 IMPLANT
SUT ETHILON 3 0 PS 1 (SUTURE) IMPLANT
SUT MNCRL AB 4-0 PS2 18 (SUTURE) ×4 IMPLANT
SUT PDS AB 1 CT1 27 (SUTURE) ×8 IMPLANT
SUT V-LOC BARB 180 2/0GR6 GS22 (SUTURE)
SUT VIC AB 0 CT1 27 (SUTURE) ×12
SUT VIC AB 0 CT1 27XBRD ANTBC (SUTURE) ×6 IMPLANT
SUT VIC AB 2-0 SH 27 (SUTURE) ×4
SUT VIC AB 2-0 SH 27X BRD (SUTURE) ×2 IMPLANT
SUT VLOC BARB 180 ABS3/0GR12 (SUTURE) ×2
SUTURE V-LC BRB 180 2/0GR6GS22 (SUTURE) IMPLANT
SUTURE VLOC BRB 180 ABS3/0GR12 (SUTURE) ×1 IMPLANT
TOWEL OR 17X26 10 PK STRL BLUE (TOWEL DISPOSABLE) ×2 IMPLANT
TOWEL OR NON WOVEN STRL DISP B (DISPOSABLE) ×2 IMPLANT
TRAY FOLEY MTR SLVR 16FR STAT (SET/KITS/TRAYS/PACK) ×2 IMPLANT
TRAY LAPAROSCOPIC (CUSTOM PROCEDURE TRAY) ×2 IMPLANT
TROCAR BLADELESS OPT 5 100 (ENDOMECHANICALS) IMPLANT
TROCAR XCEL 12X100 BLDLESS (ENDOMECHANICALS) ×2 IMPLANT
WATER STERILE IRR 1000ML POUR (IV SOLUTION) ×4 IMPLANT

## 2020-07-15 NOTE — Brief Op Note (Signed)
07/15/2020  4:27 PM  PATIENT:  Chris Holloway  69 y.o. male  PRE-OPERATIVE DIAGNOSIS:  LEFT RENAL MASS  POST-OPERATIVE DIAGNOSIS:  LEFT RENAL MASS  PROCEDURE:  Procedure(s) with comments: XI ROBOTIC ASSITED RADICAL NEPHRECTOMY WITH INTRAOPERAATIVE ULTRASOUND (Left) - 3 HRS  SURGEON:  Surgeon(s) and Role:    Alexis Frock, MD - Primary  PHYSICIAN ASSISTANT:   ASSISTANTS: Clemetine Marker PA   ANESTHESIA:   local and general  EBL:  50 mL   BLOOD ADMINISTERED:none  DRAINS: Foley to gravity   LOCAL MEDICATIONS USED:  MARCAINE     SPECIMEN:  Source of Specimen:  Left radical nephrectomy  DISPOSITION OF SPECIMEN:  PATHOLOGY  COUNTS:  YES  TOURNIQUET:  * No tourniquets in log *  DICTATION: .Other Dictation: Dictation Number  [id number]  PLAN OF CARE: Admit to inpatient   PATIENT DISPOSITION:  PACU - hemodynamically stable.   Delay start of Pharmacological VTE agent (>24hrs) due to surgical blood loss or risk of bleeding: yes

## 2020-07-15 NOTE — H&P (Signed)
Chris Holloway is an 69 y.o. male.    Chief Complaint: Pre-Op LEFT Robotic Partial v. Radical Nephrectomy  HPI:   1 - Left Renal Neoplasm - 6.5cm 70% exophytic lower pole, solid, heterogeneous mass incidetnal on spine MRI then dedicated renal MRI 03/2020. Not avidly enhancing, but some clear peripherial blood flow, encrochement on lower pole renal sinus and some hazy stranding at periphery. 1 artery / 1 vein left renovascular anatomy. No worrisoem contralateral lesions.   2 - Non-Complex Left Renal Cysts - 2cm upper posterior and 2cm upper latera non-complex cysts by MRI 03/2020.   3 - Stage 3 Renal Insuficiency - pt carries DX of GFR compromise. Cr 2021 1.1 (completely normal). Non-diabetic. MRI 2021 w/o hydro.   PMH sig for lumbago/chronic pain/gabapentin/tramadol, NO ischemic CV disese / blood thinners. NO chest/abd surgeries. 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 Marland Kitchen His PCP with W. Azalia Bilis MD with [facility].   Today "Chris Holloway" is seen to proceed with LEFT partial v. Radical nephrectomy. No interval fevers. Hgb 14.8, C19 negative. CXR negative for mets. He had coffee with creamer at 6:20 this AM by mistake, no solids.   Past Medical History:  Diagnosis Date  . Arthritis    hands back,   . Chronic kidney disease    Per Dr. Kenton Kingfisher but not with Dr. Tresa Moore  . Hypertension   . Stroke ([place]) 1998   mild stroke like symptoms lasted 5 min.    Past Surgical History:  Procedure Laterality Date  . HAND LIGAMENT RECONSTRUCTION Right 1990s   Saw injury  . SHOULDER ARTHROSCOPY W/ ROTATOR CUFF REPAIR Left 08/2018    No family history on file. Social History:  reports that he has been smoking cigarettes. He has a 22.50 pack-year smoking history. He has never used smokeless tobacco. He reports current alcohol use. He reports that he does not use drugs.  Allergies: No Known Allergies  No medications prior to admission.    Results for orders placed or performed  during the hospital encounter of 07/13/20 (from the past 48 hour(s))  SARS CORONAVIRUS 2 (TAT 6-24 HRS) Nasopharyngeal Nasopharyngeal Swab     Status: None   Collection Time: 07/13/20  8:36 AM   Specimen: Nasopharyngeal Swab  Result Value Ref Range   SARS Coronavirus 2 NEGATIVE NEGATIVE    Comment: (NOTE) SARS-CoV-2 target nucleic acids are NOT DETECTED.  The SARS-CoV-2 RNA is generally detectable in upper and lower respiratory specimens during the acute phase of infection. Negative results do not preclude SARS-CoV-2 infection, do not rule out co-infections with other pathogens, and should not be used as the sole basis for treatment or other patient management decisions. Negative results must be combined with clinical observations, patient history, and epidemiological information. The expected result is Negative.  Fact Sheet for Patients: SugarRoll.be  Fact Sheet for Healthcare Providers: https://www.woods-mathews.com/  This test is not yet approved or cleared by the [place] FDA and  has been authorized for detection and/or diagnosis of SARS-CoV-2 by FDA under an Emergency Use Authorization (EUA). This EUA will remain  in effect (meaning this test can be used) for the duration of the COVID-19 declaration under Se ction 564(b)(1) of the Act, 21 U.S.C. section 360bbb-3(b)(1), unless the authorization is terminated or revoked sooner.  Performed at [facility], [place] [address], [place],  [address]    No results found.  Review of Systems  Constitutional: Negative for chills and fever.  All other  systems reviewed and are negative.   There were no vitals taken for this visit. Physical Exam Vitals reviewed.  HENT:     Head: Normocephalic.     Nose: Nose normal.  Eyes:     Pupils: Pupils are equal, round, and reactive to light.  Cardiovascular:     Rate and Rhythm: Normal rate.     Pulses: Normal pulses.   Pulmonary:     Effort: Pulmonary effort is normal.  Abdominal:     General: Abdomen is flat.  Genitourinary:    Comments: No CVAT Musculoskeletal:     Cervical back: Normal range of motion.  Skin:    General: Skin is warm.  Neurological:     General: No focal deficit present.     Mental Status: He is alert.  Psychiatric:        Mood and Affect: Mood normal.      Assessment/Plan Proceed as planned with let partial v. Radical nephrectomy. Risks, benefits, alternatives, expected peri-op course discussed previously and reiterated today. Case will be delayed until after 12:00 per anesthesia MD given his PO intake.   Alexis Frock, MD 07/15/2020, 6:22 AM

## 2020-07-15 NOTE — Discharge Instructions (Signed)
1- Drain Sites - You may have some mild persistent drainage from old drain site for several days, this is normal. This can be covered with cotton gauze for convenience.  2 - Stiches - Your stitches are all dissolvable. You may notice a "loose thread" at your incisions, these are normal and require no intervention. You may cut them flush to the skin with fingernail clippers if needed for comfort.  3 - Diet - No restrictions  4 - Activity - No heavy lifting / straining (any activities that require valsalva or "bearing down") x 4 weeks. Otherwise, no restrictions.  5 - Bathing - You may shower immediately. Do not take a bath or get into swimming pool where incision sites are submersed in water x 4 weeks.   6 -  When to Call the Doctor - Call MD for any fever >102, any acute wound problems, or any severe nausea / vomiting. You can call the [facility] [phone]) 24 hours a day 365 days a year. It will roll-over to the answering service and on-call physician after hours.   You may resume aspirin, advil, aleve, vitamins, and supplements 7 days after surgery.

## 2020-07-15 NOTE — Anesthesia Procedure Notes (Signed)
Procedure Name: Intubation Date/Time: 07/15/2020 2:15 PM Performed by: Adalberto Ill, CRNA Pre-anesthesia Checklist: Patient identified, Emergency Drugs available, Suction available, Patient being monitored and Timeout performed Patient Re-evaluated:Patient Re-evaluated prior to induction Oxygen Delivery Method: Circle system utilized Preoxygenation: Pre-oxygenation with 100% oxygen Induction Type: IV induction Ventilation: Mask ventilation without difficulty Laryngoscope Size: Miller and 2 Grade View: Grade I Tube type: Oral Tube size: 7.5 mm Number of attempts: 1 (brief atraumatic ) Airway Equipment and Method: Stylet Placement Confirmation: ETT inserted through vocal cords under direct vision,  positive ETCO2 and breath sounds checked- equal and bilateral Secured at: 21 cm Tube secured with: Tape Dental Injury: Teeth and Oropharynx as per pre-operative assessment

## 2020-07-15 NOTE — Transfer of Care (Signed)
Immediate Anesthesia Transfer of Care Note  Patient: Chris Holloway  Procedure(s) Performed: XI ROBOTIC ASSITED RADICAL NEPHRECTOMY WITH INTRAOPERAATIVE ULTRASOUND (Left )  Patient Location: PACU  Anesthesia Type:General  Level of Consciousness: awake, alert , oriented and patient cooperative  Airway & Oxygen Therapy: Patient Spontanous Breathing and Patient connected to face mask oxygen  Post-op Assessment: Report given to RN and Post -op Vital signs reviewed and stable  Post vital signs: Reviewed and stable  Last Vitals:  Vitals Value Taken Time  BP 138/84 07/15/20 1655  Temp    Pulse 56 07/15/20 1659  Resp 15 07/15/20 1700  SpO2 100 % 07/15/20 1659  Vitals shown include unvalidated device data.  Last Pain:  Vitals:   07/15/20 0727  TempSrc:   PainSc: 2       Patients Stated Pain Goal: 1 (35/59/74 1638)  Complications: No complications documented.

## 2020-07-16 ENCOUNTER — Encounter (HOSPITAL_COMMUNITY): Payer: Self-pay | Admitting: Urology

## 2020-07-16 LAB — BASIC METABOLIC PANEL
Anion gap: 16 — ABNORMAL HIGH (ref 5–15)
BUN: 26 mg/dL — ABNORMAL HIGH (ref 8–23)
CO2: 21 mmol/L — ABNORMAL LOW (ref 22–32)
Calcium: 8.4 mg/dL — ABNORMAL LOW (ref 8.9–10.3)
Chloride: 92 mmol/L — ABNORMAL LOW (ref 98–111)
Creatinine, Ser: 1.7 mg/dL — ABNORMAL HIGH (ref 0.61–1.24)
GFR, Estimated: 44 mL/min — ABNORMAL LOW (ref >60)
Glucose, Bld: 106 mg/dL — ABNORMAL HIGH (ref 70–99)
Potassium: 3.7 mmol/L (ref 3.5–5.1)
Sodium: 129 mmol/L — ABNORMAL LOW (ref 135–145)

## 2020-07-16 LAB — HEMOGLOBIN AND HEMATOCRIT, BLOOD
HCT: 34.4 % — ABNORMAL LOW (ref 39.0–52.0)
Hemoglobin: 12.1 g/dL — ABNORMAL LOW (ref 13.0–17.0)

## 2020-07-16 MED ORDER — GABAPENTIN 100 MG PO CAPS
100.0000 mg | ORAL_CAPSULE | Freq: Three times a day (TID) | ORAL | Status: DC
  Administered 2020-07-16: 100 mg via ORAL
  Filled 2020-07-16: qty 1

## 2020-07-16 MED ORDER — ACETAMINOPHEN 500 MG PO TABS
1000.0000 mg | ORAL_TABLET | Freq: Four times a day (QID) | ORAL | Status: DC
  Administered 2020-07-16: 1000 mg via ORAL
  Filled 2020-07-16: qty 2

## 2020-07-16 NOTE — Op Note (Signed)
NAME: Chris, Holloway MEDICAL RECORD [id number] ACCOUNT [id number] DATE OF BIRTH:04-29-1951 FACILITY: [facility] LOCATION: [id number] PHYSICIAN:Brayan Votaw Tresa Moore, MD  OPERATIVE REPORT  DATE OF PROCEDURE:  07/15/2020  PREOPERATIVE DIAGNOSIS:  Large left renal mass.  POSTOPERATIVE DIAGNOSIS:  Multifocal left renal masses.  PROCEDURE: 1.  Robotic-assisted laparoscopic left radical nephrectomy. 2.  Intraoperative ultrasound with interpretation.  ESTIMATED BLOOD LOSS:  50 mL.  ASSISTANT:  Bari Mantis, PA  FINDINGS: 1.  Single artery, single vein, left renal vascular anatomy. 2.  Multifocal left renal masses including solid vascular large lower pole as well as mid pole and upper pole lesions.  Decision made for radical nephrectomy based on the multifocality.  DRAINS:  Foley catheter to straight drain.  INDICATIONS:  The patient is a very pleasant 69 year old man who was found to have a fairly large left lower pole renal mass.  He has mild renal insufficiency with creatinines varying from 1 to 1.4 baseline.  He was referred for consideration of  operative management and his initial imaging showed that his dominant left lower pole mass was fairly exophytic and given renal insufficiency, he was counseled towards partial versus radical nephrectomy pending intraoperative findings and he wished to  proceed.  Informed consent was obtained and placed in the medical record.  PROCEDURE IN DETAIL:  The patient being Chris Holloway and procedure being left robotic partial versus radical nephrectomy was confirmed.  Procedure timeout was performed.  Intravenous antibiotics administered.  General endotracheal anesthesia induced.   The patient was placed into a left side up full flank position and pulling 15 degrees of table flexion, superior arm elevator, axillary roll, sequential compression devices, bottom leg bent, top leg straight.  Foley catheter was placed free to straight  drain.  He was further  fastened to operative table using 3-inch tape over foam padding across the supraxiphoid chest and his pelvis after clipper shaving.  A sterile field was created using chlorhexidine gluconate on his entire left abdomen and flank.   Next, a high-flow, low-pressure pneumoperitoneum was obtained using Veress technique in the left lower quadrant, having passed the aspiration and drop test.  Next, an 8 mm robotic camera port was placed in position approximately 1 handbreadth  superolateral to the umbilicus.  Laparoscopic examination of the peritoneal cavity revealed no significant adhesions, no visceral injury.  Additional ports were placed as follows:  Left subcostal 8 mm robotic port, left far lateral 8 mm robotic port  approximately 3 fingerbreadths superomedial to the anterior superior iliac spine, left paramedian inferior robotic port approximately 1 handbreadth superior to pubic ramus and two 12 mm assistant port sites to midline, one in the supraumbilical crease  and another 2 fingerbreadths superior to planned camera port.  Robot was docked and passed through electronic checks.  Initial attention was directed at development of the retroperitoneum.  Incision was made lateral to the descending colon from the area  of the splenic flexure towards the area of the left internal ring and the colon was carefully swept medially.  Lateral splenic attachments were taken down allowing the spleen to rotate medially and the plane between the anterior surface of Gerota's and  the tail of the pancreas was developed.  The lower pole of the kidney area was then identified.  This clearly had a large neoplasm associated with it.  This was placed on gentle lateral traction.  Dissection proceeded medial to this.  Left ureter was  encountered, was also placed on medial traction.  The psoas musculature was identified.  Dissection proceeded within this triangle towards the renal hilum.  Renal hilum consisted of single artery,  single vein, left renal vascular anatomy as anticipated.   The artery was a marked vessel loop.  Attention was then directed at the further demarcation of the mass and the mass was very large, but fortunately appeared to be relatively exophytic and the neoplastic, _____ response around this was fairly minimal  at this point.  Therefore, dissection proceeded down to the anterior surface of the kidney and the plane between the kidney and the mass was grossly identified; however, in the lateral aspect of the kidney when dissecting the parenchyma, there appeared  to be a separate focus of exophytic renal mass that was completely separate from this and further dissection of this area revealed even a third focus of tumor even more superior and is highly concerning for multifocality of the mass extending superior to  a level even above the renal hilum.  In order to adequately achieve complete resection of neoplasm, it was essentially impossible to do this in a nephron-sparing fashion and this was quite critical decision, so the intraoperative ultrasound was  warranted to interrogate some obvious foci.  As such, drop in probe was used and intraoperative ultrasound was performed.  Intraoperative ultrasound confirmed a very large heterogeneous solid lower pole mass and did confirm 2 separate foci of heterogeneous solid masses, one approximately 1.5 cm and another approximately 2 cm that was superior to this.  Doppler interrogation  revealed that these were hypervascular, again this supported the decision to proceed with radical nephrectomy on the left as highly suspected that tumor was indeed multifocal.  As such, the previous cystic artery was controlled using extra-large  Hem-o-Lok clip proximal followed by vascular load staple distal having removed the vessel loop prior and then controlled using vascular stapler resulting in excellent hemostatic control of hilum.  The adrenal was spared.  The plane between the  adrenal  and superior medial surface of the kidney was developed using vascular stapler.  The ureter was then doubly clipped and ligated.  This completely freed the left radical nephrectomy specimen.  It was then placed into an extra-large EndoCatch bag.  Sponge,  needle counts were correct.  Hemostasis was excellent.  Robot was then undocked.  Specimen was retrieved by extending the previous 2 assistant port sites in the midline and removing the radical nephrectomy specimen setting aside for pathology.   Extraction site was then closed with fascia using figure-of-eight PDS x8 followed by reapproximation of Scarpa's with running Vicryl.  All incision sites were infiltrated with dilute lipolyzed Marcaine and closed at the level of skin using subcuticular  Monocryl followed by Dermabond.  The procedure was terminated.  The patient tolerated the procedure well.  No immediate complications.  The patient was taken to postanesthesia care in stable condition with plan for inpatient admission.  Please note, first assistant, Bari Mantis was crucial for all portions of the surgery today.  She provided invaluable retraction, specimen manipulation, ultrasound probe manipulation, vascular clipping, vascular stapling and general first assistance.  HN/NUANCE  D:07/15/2020 T:07/16/2020 JOB:[id number]

## 2020-07-16 NOTE — Discharge Summary (Signed)
Date of admission: 07/15/2020  Date of discharge: 07/17/2020  Admission diagnosis: Renal Mass  Discharge diagnosis: Renal Mass  History and Physical: For full details, please see admission history and physical. Briefly, Chris Holloway is a 69 y.o. gentleman with renal mass. After discussing management/treatment options, he elected to proceed with surgical treatment.  Hospital Course: Chris Holloway was taken to the operating room on 07/15/2020 and underwent a robotic assisted laparoscopic radical left nephrectomy He tolerated this procedure well and without complications. Postoperatively, he was able to be transferred to a regular hospital room following recovery from anesthesia.  He was able to begin ambulating the night of surgery. He remained hemodynamically stable overnight.  He had excellent urine output. He was transitioned to oral pain medication, tolerated a clear liquid diet, and had met all discharge criteria and was able to be discharged home later on POD#1.  Laboratory values:  Recent Labs    07/15/20 1723 07/16/20 0538  HGB 13.7 12.1*  HCT 39.8 34.4*    Disposition: Home  Discharge instruction: He was instructed to be ambulatory but to refrain from heavy lifting, strenuous activity, or driving. He was instructed on urethral catheter care.  Discharge medications:   Allergies as of 07/16/2020   No Known Allergies      Medication List     STOP taking these medications    aspirin EC 81 MG tablet   CRANBERRY FRUIT PO   vitamin B-12 1000 MCG tablet Commonly known as: CYANOCOBALAMIN   vitamin C 1000 MG tablet   Vitamin D3 50 MCG (2000 UT) Tabs   Vitamin D3 Super Strength 50 MCG (2000 UT) Tabs Generic drug: Cholecalciferol   zinc gluconate 50 MG tablet       TAKE these medications    ALPRAZolam 1 MG tablet Commonly known as: XANAX Take 1.5 mg by mouth at bedtime.   atenolol 50 MG tablet Commonly known as: TENORMIN Take 50 mg by mouth at bedtime.    cetirizine 10 MG tablet Commonly known as: ZYRTEC Take 10 mg by mouth.   chlorthalidone 25 MG tablet Commonly known as: HYGROTON Take 25 mg by mouth at bedtime.   Clear Eyes All Seasons 5-6 MG/ML Soln Generic drug: Polyvinyl Alcohol-Povidone Place 1 drop into both eyes daily as needed (Dry eyes).   docusate sodium 100 MG capsule Commonly known as: COLACE Take 1 capsule (100 mg total) by mouth 2 (two) times daily.   gabapentin 100 MG capsule Commonly known as: NEURONTIN Take 100 mg by mouth 3 (three) times daily.   guaifenesin 400 MG Tabs tablet Commonly known as: HUMIBID E Take 400 mg by mouth in the morning and at bedtime.   HYDROcodone-acetaminophen 5-325 MG tablet Commonly known as: Norco Take 1-2 tablets by mouth every 6 (six) hours as needed for moderate pain or severe pain.   omega-3 acid ethyl esters 1 g capsule Commonly known as: LOVAZA Take 3,000 mg by mouth at bedtime.   pravastatin 40 MG tablet Commonly known as: PRAVACHOL Take 40 mg by mouth at bedtime.   predniSONE 20 MG tablet Commonly known as: DELTASONE Take 40 mg by mouth at bedtime.   sildenafil 100 MG tablet Commonly known as: VIAGRA Take 100 mg by mouth daily as needed for erectile dysfunction.   triamcinolone cream 0.5 % Commonly known as: KENALOG Apply 1 application topically in the morning, at noon, and at bedtime.        Followup: He will followup to discuss his surgical pathology  results.

## 2020-07-16 NOTE — Progress Notes (Signed)
Dc'd with family

## 2020-07-16 NOTE — Progress Notes (Signed)
1 Day Post-Op Subjective: Some midline soreness but controlled with pain medication Tolerating clears without n/v Ambulated a few times overnight Good UOP  Objective: Vital signs in last 24 hours: Temp:  [95.2 F (35.1 C)-98.7 F (37.1 C)] 98.3 F (36.8 C) (11/18 0619) Pulse Rate:  [53-64] 61 (11/18 0619) Resp:  [10-18] 18 (11/18 0619) BP: (105-149)/(53-82) 105/53 (11/18 0619) SpO2:  [96 %-100 %] 97 % (11/18 0619)  Intake/Output from previous day: 11/17 0701 - 11/18 0700 In: 2436.6 [I.V.:2086.6; IV Piggyback:350] Out: 1300 [Urine:1250; Blood:50] Intake/Output this shift: No intake/output data recorded.  Physical Exam:  General: Alert and oriented CV: RRR Lungs: Clear Abdomen: Soft, ND Incisions: c/d/i Ext: NT, No erythema  Lab Results: Recent Labs    07/15/20 1723 07/16/20 0538  HGB 13.7 12.1*  HCT 39.8 34.4*   BMET Recent Labs    07/15/20 1723 07/16/20 0538  NA 131* 129*  K 3.5 3.7  CL 95* 92*  CO2 20* 21*  GLUCOSE 95 106*  BUN 23 26*  CREATININE 1.68* 1.70*  CALCIUM 8.8* 8.4*     Studies/Results: No results found.  Assessment/Plan: POD#1 s/p robotic radical L nephrectomy, recovering well  - pain control as needed, will discontinue IV pain meds - advance diet as tolerated to regular diet - medlock - discontinue foley with TOV this am - encourage ambulation/deep breathing - plan for discharge home later today if patient meets goals for discharge   LOS: 0 days   Chris Holloway 07/16/2020, 7:09 AM

## 2020-07-17 NOTE — Anesthesia Postprocedure Evaluation (Signed)
Anesthesia Post Note  Patient: Chris Holloway  Procedure(s) Performed: XI ROBOTIC ASSITED RADICAL NEPHRECTOMY WITH INTRAOPERAATIVE ULTRASOUND (Left )     Patient location during evaluation: PACU Anesthesia Type: General Level of consciousness: awake and alert Pain management: pain level controlled Vital Signs Assessment: post-procedure vital signs reviewed and stable Respiratory status: spontaneous breathing, nonlabored ventilation, respiratory function stable and patient connected to nasal cannula oxygen Cardiovascular status: blood pressure returned to baseline and stable Postop Assessment: no apparent nausea or vomiting Anesthetic complications: no   No complications documented.  Last Vitals:  Vitals:   07/16/20 0619 07/16/20 1417  BP: (!) 105/53 110/70  Pulse: 61 (!) 55  Resp: 18 20  Temp: 36.8 C 36.4 C  SpO2: 97% 96%    Last Pain:  Vitals:   07/16/20 1417  TempSrc: Oral  PainSc:                  [name] [name]

## 2020-07-19 LAB — SURGICAL PATHOLOGY

## 2020-07-30 DIAGNOSIS — Z905 Acquired absence of kidney: Secondary | ICD-10-CM | POA: Diagnosis not present

## 2020-07-30 DIAGNOSIS — C642 Malignant neoplasm of left kidney, except renal pelvis: Secondary | ICD-10-CM | POA: Diagnosis not present

## 2020-10-12 DIAGNOSIS — I10 Essential (primary) hypertension: Secondary | ICD-10-CM | POA: Diagnosis not present

## 2020-10-12 DIAGNOSIS — E7800 Pure hypercholesterolemia, unspecified: Secondary | ICD-10-CM | POA: Diagnosis not present

## 2020-10-12 DIAGNOSIS — N183 Chronic kidney disease, stage 3 unspecified: Secondary | ICD-10-CM | POA: Diagnosis not present

## 2020-10-12 DIAGNOSIS — F419 Anxiety disorder, unspecified: Secondary | ICD-10-CM | POA: Diagnosis not present

## 2020-10-12 DIAGNOSIS — G8929 Other chronic pain: Secondary | ICD-10-CM | POA: Diagnosis not present

## 2020-10-12 DIAGNOSIS — M545 Low back pain, unspecified: Secondary | ICD-10-CM | POA: Diagnosis not present

## 2020-11-04 DIAGNOSIS — R300 Dysuria: Secondary | ICD-10-CM | POA: Diagnosis not present

## 2020-11-04 DIAGNOSIS — N5089 Other specified disorders of the male genital organs: Secondary | ICD-10-CM | POA: Diagnosis not present

## 2020-11-25 DIAGNOSIS — A6002 Herpesviral infection of other male genital organs: Secondary | ICD-10-CM | POA: Diagnosis not present

## 2021-01-14 ENCOUNTER — Other Ambulatory Visit: Payer: Self-pay | Admitting: Urology

## 2021-01-14 ENCOUNTER — Other Ambulatory Visit (HOSPITAL_COMMUNITY): Payer: Self-pay | Admitting: Urology

## 2021-01-14 DIAGNOSIS — C642 Malignant neoplasm of left kidney, except renal pelvis: Secondary | ICD-10-CM

## 2021-01-15 ENCOUNTER — Ambulatory Visit (HOSPITAL_COMMUNITY)
Admission: RE | Admit: 2021-01-15 | Discharge: 2021-01-15 | Disposition: A | Discharge status: Home / Self Care | Payer: PPO | Source: Ambulatory Visit | Attending: Urology | Admitting: Urology

## 2021-01-15 ENCOUNTER — Other Ambulatory Visit: Payer: Self-pay

## 2021-01-15 DIAGNOSIS — C642 Malignant neoplasm of left kidney, except renal pelvis: Secondary | ICD-10-CM | POA: Diagnosis not present

## 2021-01-15 DIAGNOSIS — I700 Atherosclerosis of aorta: Secondary | ICD-10-CM | POA: Diagnosis not present

## 2021-01-19 DIAGNOSIS — C642 Malignant neoplasm of left kidney, except renal pelvis: Secondary | ICD-10-CM | POA: Diagnosis not present

## 2021-01-26 DIAGNOSIS — C649 Malignant neoplasm of unspecified kidney, except renal pelvis: Secondary | ICD-10-CM | POA: Diagnosis not present

## 2021-01-26 DIAGNOSIS — K7689 Other specified diseases of liver: Secondary | ICD-10-CM | POA: Diagnosis not present

## 2021-01-26 DIAGNOSIS — C642 Malignant neoplasm of left kidney, except renal pelvis: Secondary | ICD-10-CM | POA: Diagnosis not present

## 2021-04-06 DIAGNOSIS — R911 Solitary pulmonary nodule: Secondary | ICD-10-CM | POA: Diagnosis not present

## 2021-04-06 DIAGNOSIS — N5201 Erectile dysfunction due to arterial insufficiency: Secondary | ICD-10-CM | POA: Diagnosis not present

## 2021-04-06 DIAGNOSIS — C642 Malignant neoplasm of left kidney, except renal pelvis: Secondary | ICD-10-CM | POA: Diagnosis not present

## 2021-04-13 DIAGNOSIS — F17200 Nicotine dependence, unspecified, uncomplicated: Secondary | ICD-10-CM | POA: Diagnosis not present

## 2021-04-13 DIAGNOSIS — I10 Essential (primary) hypertension: Secondary | ICD-10-CM | POA: Diagnosis not present

## 2021-04-13 DIAGNOSIS — L820 Inflamed seborrheic keratosis: Secondary | ICD-10-CM | POA: Diagnosis not present

## 2021-04-13 DIAGNOSIS — Z1211 Encounter for screening for malignant neoplasm of colon: Secondary | ICD-10-CM | POA: Diagnosis not present

## 2021-04-13 DIAGNOSIS — Z0000 Encounter for general adult medical examination without abnormal findings: Secondary | ICD-10-CM | POA: Diagnosis not present

## 2021-04-13 DIAGNOSIS — G8929 Other chronic pain: Secondary | ICD-10-CM | POA: Diagnosis not present

## 2021-04-13 DIAGNOSIS — M5441 Lumbago with sciatica, right side: Secondary | ICD-10-CM | POA: Diagnosis not present

## 2021-04-13 DIAGNOSIS — F5101 Primary insomnia: Secondary | ICD-10-CM | POA: Diagnosis not present

## 2021-04-13 DIAGNOSIS — E7800 Pure hypercholesterolemia, unspecified: Secondary | ICD-10-CM | POA: Diagnosis not present

## 2021-04-13 DIAGNOSIS — R0602 Shortness of breath: Secondary | ICD-10-CM | POA: Diagnosis not present

## 2021-05-07 DIAGNOSIS — J019 Acute sinusitis, unspecified: Secondary | ICD-10-CM | POA: Diagnosis not present

## 2021-05-07 DIAGNOSIS — J069 Acute upper respiratory infection, unspecified: Secondary | ICD-10-CM | POA: Diagnosis not present

## 2021-05-07 DIAGNOSIS — Z20822 Contact with and (suspected) exposure to covid-19: Secondary | ICD-10-CM | POA: Diagnosis not present

## 2021-08-08 DIAGNOSIS — J208 Acute bronchitis due to other specified organisms: Secondary | ICD-10-CM | POA: Diagnosis not present

## 2021-08-08 DIAGNOSIS — R051 Acute cough: Secondary | ICD-10-CM | POA: Diagnosis not present

## 2021-08-09 DIAGNOSIS — R911 Solitary pulmonary nodule: Secondary | ICD-10-CM | POA: Diagnosis not present

## 2021-08-09 DIAGNOSIS — Z125 Encounter for screening for malignant neoplasm of prostate: Secondary | ICD-10-CM | POA: Diagnosis not present

## 2021-08-09 DIAGNOSIS — C642 Malignant neoplasm of left kidney, except renal pelvis: Secondary | ICD-10-CM | POA: Diagnosis not present

## 2021-08-09 DIAGNOSIS — J439 Emphysema, unspecified: Secondary | ICD-10-CM | POA: Diagnosis not present

## 2021-08-09 DIAGNOSIS — I700 Atherosclerosis of aorta: Secondary | ICD-10-CM | POA: Diagnosis not present

## 2021-08-16 DIAGNOSIS — R911 Solitary pulmonary nodule: Secondary | ICD-10-CM | POA: Diagnosis not present

## 2021-08-16 DIAGNOSIS — Z905 Acquired absence of kidney: Secondary | ICD-10-CM | POA: Diagnosis not present

## 2021-08-16 DIAGNOSIS — N183 Chronic kidney disease, stage 3 unspecified: Secondary | ICD-10-CM | POA: Diagnosis not present

## 2021-08-16 DIAGNOSIS — C642 Malignant neoplasm of left kidney, except renal pelvis: Secondary | ICD-10-CM | POA: Diagnosis not present

## 2021-08-29 DIAGNOSIS — C801 Malignant (primary) neoplasm, unspecified: Secondary | ICD-10-CM

## 2021-08-29 HISTORY — DX: Malignant (primary) neoplasm, unspecified: C80.1

## 2021-09-02 DIAGNOSIS — E162 Hypoglycemia, unspecified: Secondary | ICD-10-CM | POA: Diagnosis not present

## 2021-09-02 DIAGNOSIS — M545 Low back pain, unspecified: Secondary | ICD-10-CM | POA: Diagnosis not present

## 2021-09-02 DIAGNOSIS — R1030 Lower abdominal pain, unspecified: Secondary | ICD-10-CM | POA: Diagnosis not present

## 2021-09-09 ENCOUNTER — Ambulatory Visit: Payer: PPO | Admitting: Pulmonary Disease

## 2021-09-09 ENCOUNTER — Other Ambulatory Visit: Payer: Self-pay

## 2021-09-09 ENCOUNTER — Encounter: Payer: Self-pay | Admitting: Pulmonary Disease

## 2021-09-09 VITALS — BP 124/78 | HR 60 | Temp 97.9°F | Ht 71.0 in | Wt 169.8 lb

## 2021-09-09 DIAGNOSIS — Z720 Tobacco use: Secondary | ICD-10-CM | POA: Diagnosis not present

## 2021-09-09 DIAGNOSIS — Z905 Acquired absence of kidney: Secondary | ICD-10-CM

## 2021-09-09 DIAGNOSIS — R911 Solitary pulmonary nodule: Secondary | ICD-10-CM

## 2021-09-09 DIAGNOSIS — N2889 Other specified disorders of kidney and ureter: Secondary | ICD-10-CM | POA: Diagnosis not present

## 2021-09-09 NOTE — Progress Notes (Signed)
Synopsis: Referred in January 2023 for lung nodule by Alexis Frock, MD  Subjective:   PATIENT ID: Chris Holloway GENDER: male DOB: September 03, 1950, MRN: [id number]  Chief Complaint  Patient presents with   Consult    Patient said his doctor seen a spot on his left lung so he is here to talk about that and to talk about stopping smoking.     This is a 71 year old gentleman with a past medical history of tobacco abuse, left renal cancer status post left robotic radical nephrectomy in November 2021 staged as a pT1b with 3 multifocal tumors papillary renal cell carcinoma grade 3 with negative margins the dominant tumor in her was 6.5 cm in size.  Also has a stage III chronic kidney disease and a left lower lobe nodule found incidentally on surveillance CT imaging.  Patient is followed by [facility] with Dr. Tresa Moore.  Patient is a longstanding tobacco abuse history for 57+ years.  Patient had a CT scan follow-up in December 2022 which revealed the persistence of a 1.9 cm left lower lobe pulmonary nodule that is adjacent to the aorta also has some lower lobe smaller supradiaphragmatic lesions and a small nodule within the right upper lobe and right lower lobe both located in the periphery.  He also has evidence of significant centrilobular emphysema.  He is still smoking approximately 10 cigarettes a day.   Past Medical History:  Diagnosis Date   Arthritis    hands back,    Chronic kidney disease    Per Dr. Kenton Kingfisher but not with Dr. Tresa Moore   Hypertension    Stroke [facility]) 1998   mild stroke like symptoms lasted 5 min.     No family history on file.   Past Surgical History:  Procedure Laterality Date   HAND LIGAMENT RECONSTRUCTION Right 1990s   Saw injury   ROBOTIC ASSITED PARTIAL NEPHRECTOMY Left 07/15/2020   Procedure: XI ROBOTIC ASSITED RADICAL NEPHRECTOMY WITH INTRAOPERAATIVE ULTRASOUND;  Surgeon: Alexis Frock, MD;  Location: [facility];  Service: Urology;  Laterality: Left;  3 HRS    SHOULDER ARTHROSCOPY W/ ROTATOR CUFF REPAIR Left 08/2018    Social History   Socioeconomic History   Marital status: Widowed    Spouse name: Not on file   Number of children: Not on file   Years of education: Not on file   Highest education level: Not on file  Occupational History   Not on file  Tobacco Use   Smoking status: Some Days    Packs/day: 1.50    Years: 15.00    Pack years: 22.50    Types: Cigarettes   Smokeless tobacco: Never  Vaping Use   Vaping Use: Never used  Substance and Sexual Activity   Alcohol use: Yes    Comment: 7/wk   Drug use: Never   Sexual activity: Not on file  Other Topics Concern   Not on file  Social History Narrative   Not on file   Social Determinants of Health   Financial Resource Strain: Not on file  Food Insecurity: Not on file  Transportation Needs: Not on file  Physical Activity: Not on file  Stress: Not on file  Social Connections: Not on file  Intimate Partner Violence: Not on file     No Known Allergies   Outpatient Medications Prior to Visit  Medication Sig Dispense Refill   ALPRAZolam (XANAX) 1 MG tablet Take 1.5 mg by mouth at bedtime.     cetirizine (ZYRTEC) 10 MG  tablet Take 10 mg by mouth.     chlorthalidone (HYGROTON) 25 MG tablet Take 25 mg by mouth at bedtime.     docusate sodium (COLACE) 100 MG capsule Take 1 capsule (100 mg total) by mouth 2 (two) times daily.     gabapentin (NEURONTIN) 100 MG capsule Take 100 mg by mouth 3 (three) times daily.     guaifenesin (HUMIBID E) 400 MG TABS tablet Take 400 mg by mouth in the morning and at bedtime.     HYDROcodone-acetaminophen (NORCO) 5-325 MG tablet Take 1-2 tablets by mouth every 6 (six) hours as needed for moderate pain or severe pain. 20 tablet 0   omega-3 acid ethyl esters (LOVAZA) 1 g capsule Take 3,000 mg by mouth at bedtime.     Polyvinyl Alcohol-Povidone (CLEAR EYES ALL SEASONS) 5-6 MG/ML SOLN Place 1 drop into both eyes daily as needed (Dry eyes).      pravastatin (PRAVACHOL) 40 MG tablet Take 40 mg by mouth at bedtime.     sildenafil (VIAGRA) 100 MG tablet Take 100 mg by mouth daily as needed for erectile dysfunction.     triamcinolone cream (KENALOG) 0.5 % Apply 1 application topically in the morning, at noon, and at bedtime.     atenolol (TENORMIN) 50 MG tablet Take 50 mg by mouth at bedtime.      predniSONE (DELTASONE) 20 MG tablet Take 40 mg by mouth at bedtime.     No facility-administered medications prior to visit.    Review of Systems  Constitutional:  Negative for chills, fever, malaise/fatigue and weight loss.  HENT:  Negative for hearing loss, sore throat and tinnitus.   Eyes:  Negative for blurred vision and double vision.  Respiratory:  Positive for shortness of breath. Negative for cough, hemoptysis, sputum production, wheezing and stridor.   Cardiovascular:  Negative for chest pain, palpitations, orthopnea, leg swelling and PND.  Gastrointestinal:  Negative for abdominal pain, constipation, diarrhea, heartburn, nausea and vomiting.  Genitourinary:  Negative for dysuria, hematuria and urgency.  Musculoskeletal:  Negative for joint pain and myalgias.  Skin:  Negative for itching and rash.  Neurological:  Negative for dizziness, tingling, weakness and headaches.  Endo/Heme/Allergies:  Negative for environmental allergies. Does not bruise/bleed easily.  Psychiatric/Behavioral:  Negative for depression. The patient is not nervous/anxious and does not have insomnia.   All other systems reviewed and are negative.   Objective:  Physical Exam Vitals reviewed.  Constitutional:      General: He is not in acute distress.    Appearance: He is well-developed.  HENT:     Head: Normocephalic and atraumatic.  Eyes:     General: No scleral icterus.    Conjunctiva/sclera: Conjunctivae normal.     Pupils: Pupils are equal, round, and reactive to light.  Neck:     Vascular: No JVD.     Trachea: No tracheal deviation.   Cardiovascular:     Rate and Rhythm: Normal rate and regular rhythm.     Heart sounds: Normal heart sounds. No murmur heard. Pulmonary:     Effort: Pulmonary effort is normal. No tachypnea, accessory muscle usage or respiratory distress.     Breath sounds: No stridor. No wheezing, rhonchi or rales.     Comments: Diminished breath sounds bilaterally Abdominal:     General: Bowel sounds are normal. There is no distension.     Palpations: Abdomen is soft.     Tenderness: There is no abdominal tenderness.  Musculoskeletal:  General: No tenderness.     Cervical back: Neck supple.  Lymphadenopathy:     Cervical: No cervical adenopathy.  Skin:    General: Skin is warm and dry.     Capillary Refill: Capillary refill takes less than 2 seconds.     Findings: No rash.  Neurological:     Mental Status: He is alert and oriented to person, place, and time.  Psychiatric:        Behavior: Behavior normal.     Vitals:   09/09/21 0938  BP: 124/78  Pulse: 60  Temp: 97.9 F (36.6 C)  TempSrc: Oral  SpO2: 98%  Weight: 169 lb 12.8 oz (77 kg)  Height: 5\' 11"  (1.803 m)   98% on RA BMI Readings from Last 3 Encounters:  09/09/21 23.68 kg/m  07/15/20 23.99 kg/m  07/09/20 23.99 kg/m   Wt Readings from Last 3 Encounters:  09/09/21 169 lb 12.8 oz (77 kg)  07/15/20 172 lb (78 kg)  07/09/20 172 lb (78 kg)     CBC    Component Value Date/Time   WBC 11.4 (H) 07/09/2020 1029   RBC 4.32 07/09/2020 1029   HGB 12.1 (L) 07/16/2020 0538   HCT 34.4 (L) 07/16/2020 0538   PLT 305 07/09/2020 1029   MCV 101.2 (H) 07/09/2020 1029   MCH 34.3 (H) 07/09/2020 1029   MCHC 33.9 07/09/2020 1029   RDW 12.8 07/09/2020 1029    Chest Imaging: Dec 2022: Ct chest with LLL 1.9cm nodule adjacent to aorta The patient's images have been independently reviewed by me.    Pulmonary Functions Testing Results: No flowsheet data found.  FeNO:   Pathology:   Echocardiogram:   Heart  Catheterization:     Assessment & Plan:     ICD-10-CM   1. Lung nodule  R91.1 NM PET Image Initial (PI) Skull Base To Thigh (F-18 FDG)    2. Renal mass  N28.89     3. History of nephrectomy  Z90.5     4. Tobacco use  Z72.0       Discussion:  This is a 71 year old gentleman longstanding history of tobacco abuse, 57+ years still smoking 10 cigarettes a day at his max was at 1.5 packs/day has a left lower lobe 1.9 cm nodule that is adjacent to the aorta and other smaller nodules along the diaphragm.  He does have a history of renal cell carcinoma that was removed with a radical nephrectomy in 2021.  Surveillance imaging found this nodule has been stable for short period but due to size of referred for additional evaluation.  Plan: I discussed next best steps with the patient. I think we should start by evaluating the nodule with a PET scan. Patient is agreeable to this plan. Pending on the PET scan I think he should potentially undergo tissue sampling with biopsy. We talked about robotic assisted bronchoscopy and navigation to a small nodule in the lower lobe today in the office. Patient is also agreeable to this plan if needed.    Current Outpatient Medications:    ALPRAZolam (XANAX) 1 MG tablet, Take 1.5 mg by mouth at bedtime., Disp: , Rfl:    cetirizine (ZYRTEC) 10 MG tablet, Take 10 mg by mouth., Disp: , Rfl:    chlorthalidone (HYGROTON) 25 MG tablet, Take 25 mg by mouth at bedtime., Disp: , Rfl:    docusate sodium (COLACE) 100 MG capsule, Take 1 capsule (100 mg total) by mouth 2 (two) times daily., Disp: , Rfl:  gabapentin (NEURONTIN) 100 MG capsule, Take 100 mg by mouth 3 (three) times daily., Disp: , Rfl:    guaifenesin (HUMIBID E) 400 MG TABS tablet, Take 400 mg by mouth in the morning and at bedtime., Disp: , Rfl:    HYDROcodone-acetaminophen (NORCO) 5-325 MG tablet, Take 1-2 tablets by mouth every 6 (six) hours as needed for moderate pain or severe pain., Disp: 20  tablet, Rfl: 0   omega-3 acid ethyl esters (LOVAZA) 1 g capsule, Take 3,000 mg by mouth at bedtime., Disp: , Rfl:    Polyvinyl Alcohol-Povidone (CLEAR EYES ALL SEASONS) 5-6 MG/ML SOLN, Place 1 drop into both eyes daily as needed (Dry eyes)., Disp: , Rfl:    pravastatin (PRAVACHOL) 40 MG tablet, Take 40 mg by mouth at bedtime., Disp: , Rfl:    sildenafil (VIAGRA) 100 MG tablet, Take 100 mg by mouth daily as needed for erectile dysfunction., Disp: , Rfl:    triamcinolone cream (KENALOG) 0.5 %, Apply 1 application topically in the morning, at noon, and at bedtime., Disp: , Rfl:    atenolol (TENORMIN) 50 MG tablet, Take 50 mg by mouth at bedtime. , Disp: , Rfl:    predniSONE (DELTASONE) 20 MG tablet, Take 40 mg by mouth at bedtime., Disp: , Rfl:    I spent 62 minutes dedicated to the care of this patient on the date of this encounter to include pre-visit review of records, face-to-face time with the patient discussing conditions above, post visit ordering of testing, clinical documentation with the electronic health record, making appropriate referrals as documented, and communicating necessary findings to members of the patients care team.   Garner Nash, DO [facility] 09/09/2021 9:57 AM

## 2021-09-09 NOTE — Patient Instructions (Signed)
Thank you for visiting Dr. Valeta Harms at [facility]. Today we recommend the following:  Orders Placed This Encounter  Procedures   NM PET Image Initial (PI) Skull Base To Thigh (F-18 FDG)   PET scan first. See me back to come up with plan for possible biopsy if needed.   Return in about 3 weeks (around 09/30/2021) for w/ Dr. Valeta Harms .    Please do your part to reduce the spread of COVID-19.

## 2021-09-10 ENCOUNTER — Encounter: Payer: PPO | Admitting: *Deleted

## 2021-09-10 DIAGNOSIS — R911 Solitary pulmonary nodule: Secondary | ICD-10-CM

## 2021-09-10 DIAGNOSIS — Z006 Encounter for examination for normal comparison and control in clinical research program: Secondary | ICD-10-CM

## 2021-09-10 NOTE — Research (Signed)
Title: [place]: CliNIcal Utility of ManaGement of Patients witH CT and LDCT Identified Pulmonary Nodules UsinG the Percepta NasAL Swab ClassifiEr -- with Familiarization   Protocol #: [id number] Sponsor: [employer]   Protocol Revision 1 dated 01Sep2022 and confirmed current on today's visit, IRB approved Revision 1 on 29Dec2022.   Objectives:  Primary: To evaluate if use of the Percepta Nasal Swab test in the diagnostic work up of newly identified pulmonary nodules reduces the number of invasive procedures in the group classified as low-risk by the test and that are benign as compared to a control group managed without a Percepta Nasal Swab test result.                   A newly identified nodule is defined as any nodule first identified on imaging                   <90 days prior to nasal sample collection that hasnt undergone a diagnostic                    procedure for the management of their index nodule prior to enrollment.                   CT imaging includes conventional CT, LDCT, HRCT                   Benign diagnosis is defined as a specific diagnosis of a benign condition,                    radiographic resolution or stability at ? 24 months, or no cytological,                    radiological, or pathological evidence of cancer.                   Procedures will be categorized as either invasive or non-invasive in the Data                    Management Plan (DMP). Secondary: To evaluate if use of the Percepta Nasal Swab test in the diagnostic work up of newly identified pulmonary nodules increases the proportion of subjects classified as high-risk by the test and have primary lung cancer that go directly to appropriate therapy as compared to a control group managed without a Percepta Nasal Swab test result.                    Proportion of subjects that go directly to appropriate therapy is defined as those                     subjects that undergo surgery, ablative  or other appropriate therapy as the next                     step after the Percepta Nasal Swab test result without intervening non-surgical                     procedures                             a. Non-surgical procedures include diagnostic PET, but not PET for  staging purposes.                             b. Appropriate therapies will be defined in the CRF.                    A newly identified nodule is defined as any nodule first identified on imaging                    <90 days prior to nasal sample collection.                    Lung cancer diagnosis is defined as established by cytology or pathology, or in                     circumstances where a presumptive diagnosis of cancer led to definitive                     ablative or other appropriate therapy without pathology. Key Inclusion Criteria:  Inclusion Criteria:  Able to tolerate nasal epithelial specimen collection  Signed written Informed Consent obtained  Subject clinical history available for review by sponsor and regulatory agencies  New nodule first identified on imaging < 90 days prior to nasal sample collection (index nodule)  CT report available for index nodule  23 - 25 years of age  Current or former smoker (>100 cigarettes in a lifetime)  Pulmonary nodule ?30 mm detected by CT  Key Exclusion Criteria: Exclusion Criteria  Subject has undergone a diagnostic procedure for the management of their index nodule after the index CT and prior to enrollment  Active cancer (other than non-melanoma skin cancer)  Prior primary lung cancer (prior non-lung cancer acceptable)  Prior participation in this study (i.e., subjects may not be enrolled more than once)  Current active treatment with an investigational device or drug (patients in trial follow up period are okay if intervention phase is complete)  Patient enrolled or planned to be enrolled in another clinical trial that may influence  management of the patients nodule  Concurrent or planned use of tools or tests for assigning lung nodule risk of malignancy (e.g., genomic or proteomic blood tests) other than clinically validated risk calculators  Clinical Research Coordinator / Research RN note : This visit is for Chris Holloway Subject [id number] with DOB: 02/11/1942 on [id number] for the above protocol is an Enrollment Visit and is for purpose of research.    Subject expressed interest and consent in continuing as a study subject. Subject confirmed contact information (e.g. address, telephone, email). Subject thanked for participation in research and contribution to science.     During this visit on [id number] Baseline visit , the subject reviewed and signed the consent form, provided demographics, and had a nasal swab collected per the above referenced protocol. Please refer to the subject's paper source binder for further details.   Signed by Jaye Beagle CRNII

## 2021-09-21 ENCOUNTER — Ambulatory Visit (HOSPITAL_COMMUNITY)
Admission: RE | Admit: 2021-09-21 | Discharge: 2021-09-21 | Disposition: A | Discharge status: Home / Self Care | Payer: PPO | Source: Ambulatory Visit | Attending: Pulmonary Disease | Admitting: Pulmonary Disease

## 2021-09-21 ENCOUNTER — Other Ambulatory Visit: Payer: Self-pay

## 2021-09-21 DIAGNOSIS — I2510 Atherosclerotic heart disease of native coronary artery without angina pectoris: Secondary | ICD-10-CM | POA: Diagnosis not present

## 2021-09-21 DIAGNOSIS — J439 Emphysema, unspecified: Secondary | ICD-10-CM | POA: Diagnosis not present

## 2021-09-21 DIAGNOSIS — R911 Solitary pulmonary nodule: Secondary | ICD-10-CM | POA: Diagnosis not present

## 2021-09-21 DIAGNOSIS — Z85528 Personal history of other malignant neoplasm of kidney: Secondary | ICD-10-CM | POA: Diagnosis not present

## 2021-09-21 DIAGNOSIS — I700 Atherosclerosis of aorta: Secondary | ICD-10-CM | POA: Diagnosis not present

## 2021-09-21 LAB — GLUCOSE, CAPILLARY: Glucose-Capillary: 93 mg/dL (ref 70–99)

## 2021-09-21 MED ORDER — FLUDEOXYGLUCOSE F - 18 (FDG) INJECTION
8.4000 | Freq: Once | INTRAVENOUS | Status: AC
  Administered 2021-09-21: 08:00:00 8.19 via INTRAVENOUS

## 2021-09-30 ENCOUNTER — Encounter: Payer: Self-pay | Admitting: Pulmonary Disease

## 2021-09-30 ENCOUNTER — Telehealth: Payer: Self-pay | Admitting: Pulmonary Disease

## 2021-09-30 ENCOUNTER — Other Ambulatory Visit: Payer: Self-pay

## 2021-09-30 ENCOUNTER — Ambulatory Visit: Payer: PPO | Admitting: Pulmonary Disease

## 2021-09-30 VITALS — BP 118/76 | HR 89 | Temp 97.9°F | Ht 71.0 in | Wt 169.6 lb

## 2021-09-30 DIAGNOSIS — R911 Solitary pulmonary nodule: Secondary | ICD-10-CM

## 2021-09-30 DIAGNOSIS — C3412 Malignant neoplasm of upper lobe, left bronchus or lung: Secondary | ICD-10-CM | POA: Diagnosis present

## 2021-09-30 DIAGNOSIS — N2889 Other specified disorders of kidney and ureter: Secondary | ICD-10-CM

## 2021-09-30 DIAGNOSIS — Z905 Acquired absence of kidney: Secondary | ICD-10-CM | POA: Diagnosis not present

## 2021-09-30 DIAGNOSIS — Z720 Tobacco use: Secondary | ICD-10-CM

## 2021-09-30 DIAGNOSIS — R942 Abnormal results of pulmonary function studies: Secondary | ICD-10-CM

## 2021-09-30 NOTE — Patient Instructions (Signed)
Thank you for visiting Dr. Valeta Harms at [facility]. Today we recommend the following:  Orders Placed This Encounter  Procedures   Procedural/ Surgical Case Request: ROBOTIC ASSISTED NAVIGATIONAL BRONCHOSCOPY   CT Super D Chest Wo Contrast   Ambulatory referral to Pulmonology   Bronchoscopy on 10/19/2021  Return in about 26 days (around 10/26/2021) for with Eric Form, NP.    Please do your part to reduce the spread of COVID-19.

## 2021-09-30 NOTE — Telephone Encounter (Signed)
I scheduled ENB for 2/21 at 7:30.  Pt will go for covid test on 2/17 and will get CT at [facility] on 2/17.  I gave appt info to pt with letter.

## 2021-09-30 NOTE — H&P (View-Only) (Signed)
Synopsis: Referred in January 2023 for lung nodule by Shirline Frees, MD  Subjective:   PATIENT ID: Chris Holloway GENDER: male DOB: 28-Sep-1950, MRN: [id number]  Chief Complaint  Patient presents with   Follow-up    Patient says everything is going good. Patient wants to talk about his back constantly itching, says it's worse at night and its been going on for 3 months.     This is a 71 year old gentleman with a past medical history of tobacco abuse, left renal cancer status post left robotic radical nephrectomy in November 2021 staged as a pT1b with 3 multifocal tumors papillary renal cell carcinoma grade 3 with negative margins the dominant tumor in her was 6.5 cm in size.  Also has a stage III chronic kidney disease and a left lower lobe nodule found incidentally on surveillance CT imaging.  [facility] is followed by [facility] with Dr. Tresa Moore.  Patient is a longstanding tobacco abuse history for 57+ years.  Patient had a CT scan follow-up in December 2022 which revealed the persistence of a 1.9 cm left lower lobe pulmonary nodule that is adjacent to the aorta also has some lower lobe smaller supradiaphragmatic lesions and a small nodule within the right upper lobe and right lower lobe both located in the periphery.  He also has evidence of significant centrilobular emphysema.  He is still smoking approximately 10 cigarettes a day.  OV 09/30/2021: Here today for follow-up after recent PET scan.  Nuclear medicine PET scan completed at the end of January 2023.  CT imaging and nuclear medicine imaging reviewed.  He has hypermetabolic uptake within the left lung lesion.  Other small areas of nodularity noted however no distant metastatic disease.  We reviewed PET scan results today.   Past Medical History:  Diagnosis Date   Arthritis    hands back,    Chronic kidney disease    Per Dr. Kenton Kingfisher but not with Dr. Tresa Moore   Hypertension    Stroke [facility]) 1998   mild stroke like symptoms lasted 5  min.     No family history on file.   Past Surgical History:  Procedure Laterality Date   HAND LIGAMENT RECONSTRUCTION Right 1990s   Saw injury   ROBOTIC ASSITED PARTIAL NEPHRECTOMY Left 07/15/2020   Procedure: XI ROBOTIC ASSITED RADICAL NEPHRECTOMY WITH INTRAOPERAATIVE ULTRASOUND;  Surgeon: Alexis Frock, MD;  Location: [facility];  Service: Urology;  Laterality: Left;  3 HRS   SHOULDER ARTHROSCOPY W/ ROTATOR CUFF REPAIR Left 08/2018    Social History   Socioeconomic History   Marital status: Widowed    Spouse name: Not on file   Number of children: Not on file   Years of education: Not on file   Highest education level: Not on file  Occupational History   Not on file  Tobacco Use   Smoking status: Some Days    Packs/day: 1.50    Years: 15.00    Pack years: 22.50    Types: Cigarettes   Smokeless tobacco: Never  Vaping Use   Vaping Use: Never used  Substance and Sexual Activity   Alcohol use: Yes    Comment: 7/wk   Drug use: Never   Sexual activity: Not on file  Other Topics Concern   Not on file  Social History Narrative   Not on file   Social Determinants of Health   Financial Resource Strain: Not on file  Food Insecurity: Not on file  Transportation Needs: Not on file  Physical  Activity: Not on file  Stress: Not on file  Social Connections: Not on file  Intimate Partner Violence: Not on file     No Known Allergies   Outpatient Medications Prior to Visit  Medication Sig Dispense Refill   ALPRAZolam (XANAX) 1 MG tablet Take 1.5 mg by mouth at bedtime.     cetirizine (ZYRTEC) 10 MG tablet Take 10 mg by mouth.     chlorthalidone (HYGROTON) 25 MG tablet Take 25 mg by mouth at bedtime.     docusate sodium (COLACE) 100 MG capsule Take 1 capsule (100 mg total) by mouth 2 (two) times daily.     gabapentin (NEURONTIN) 100 MG capsule Take 100 mg by mouth 3 (three) times daily.     guaifenesin (HUMIBID E) 400 MG TABS tablet Take 400 mg by mouth in the morning and  at bedtime.     HYDROcodone-acetaminophen (NORCO) 5-325 MG tablet Take 1-2 tablets by mouth every 6 (six) hours as needed for moderate pain or severe pain. 20 tablet 0   omega-3 acid ethyl esters (LOVAZA) 1 g capsule Take 3,000 mg by mouth at bedtime.     Polyvinyl Alcohol-Povidone (CLEAR EYES ALL SEASONS) 5-6 MG/ML SOLN Place 1 drop into both eyes daily as needed (Dry eyes).     pravastatin (PRAVACHOL) 40 MG tablet Take 40 mg by mouth at bedtime.     sildenafil (VIAGRA) 100 MG tablet Take 100 mg by mouth daily as needed for erectile dysfunction.     atenolol (TENORMIN) 50 MG tablet Take 50 mg by mouth at bedtime.      predniSONE (DELTASONE) 20 MG tablet Take 40 mg by mouth at bedtime.     triamcinolone cream (KENALOG) 0.5 % Apply 1 application topically in the morning, at noon, and at bedtime.     No facility-administered medications prior to visit.    Review of Systems  Constitutional:  Negative for chills, fever, malaise/fatigue and weight loss.  HENT:  Negative for hearing loss, sore throat and tinnitus.   Eyes:  Negative for blurred vision and double vision.  Respiratory:  Negative for cough, hemoptysis, sputum production, shortness of breath, wheezing and stridor.   Cardiovascular:  Negative for chest pain, palpitations, orthopnea, leg swelling and PND.  Gastrointestinal:  Negative for abdominal pain, constipation, diarrhea, heartburn, nausea and vomiting.  Genitourinary:  Negative for dysuria, hematuria and urgency.  Musculoskeletal:  Negative for joint pain and myalgias.  Skin:  Negative for itching and rash.  Neurological:  Negative for dizziness, tingling, weakness and headaches.  Endo/Heme/Allergies:  Negative for environmental allergies. Does not bruise/bleed easily.  Psychiatric/Behavioral:  Negative for depression. The patient is not nervous/anxious and does not have insomnia.   All other systems reviewed and are negative.   Objective:  Physical Exam Vitals reviewed.   Constitutional:      General: He is not in acute distress.    Appearance: He is well-developed.  HENT:     Head: Normocephalic and atraumatic.  Eyes:     General: No scleral icterus.    Conjunctiva/sclera: Conjunctivae normal.     Pupils: Pupils are equal, round, and reactive to light.  Neck:     Vascular: No JVD.     Trachea: No tracheal deviation.  Cardiovascular:     Rate and Rhythm: Normal rate and regular rhythm.     Heart sounds: Normal heart sounds. No murmur heard. Pulmonary:     Effort: Pulmonary effort is normal. No tachypnea, accessory muscle usage or respiratory  distress.     Breath sounds: No stridor. No wheezing, rhonchi or rales.  Abdominal:     General: There is no distension.     Palpations: Abdomen is soft.     Tenderness: There is no abdominal tenderness.  Musculoskeletal:        General: No tenderness.     Cervical back: Neck supple.  Lymphadenopathy:     Cervical: No cervical adenopathy.  Skin:    General: Skin is warm and dry.     Capillary Refill: Capillary refill takes less than 2 seconds.     Findings: No rash.  Neurological:     Mental Status: He is alert and oriented to person, place, and time.  Psychiatric:        Behavior: Behavior normal.     Vitals:   09/30/21 0858  BP: 118/76  Pulse: 89  Temp: 97.9 F (36.6 C)  TempSrc: Oral  SpO2: 98%  Weight: 169 lb 9.6 oz (76.9 kg)  Height: 5\' 11"  (1.803 m)   98% on RA BMI Readings from Last 3 Encounters:  09/30/21 23.65 kg/m  09/09/21 23.68 kg/m  07/15/20 23.99 kg/m   Wt Readings from Last 3 Encounters:  09/30/21 169 lb 9.6 oz (76.9 kg)  09/09/21 169 lb 12.8 oz (77 kg)  07/15/20 172 lb (78 kg)     CBC    Component Value Date/Time   WBC 11.4 (H) 07/09/2020 1029   RBC 4.32 07/09/2020 1029   HGB 12.1 (L) 07/16/2020 0538   HCT 34.4 (L) 07/16/2020 0538   PLT 305 07/09/2020 1029   MCV 101.2 (H) 07/09/2020 1029   MCH 34.3 (H) 07/09/2020 1029   MCHC 33.9 07/09/2020 1029   RDW  12.8 07/09/2020 1029    Chest Imaging: Dec 2022: Ct chest with LLL 1.9cm nodule adjacent to aorta The patient's images have been independently reviewed by me.    January 2023 nuclear medicine pet imaging: Hypermetabolic uptake within the left lower lobe 1.9 cm nodule adjacent to the aorta.  Concerning for primary bronchogenic carcinoma, no evidence of metastatic disease. The patient's images have been independently reviewed by me.    Pulmonary Functions Testing Results: No flowsheet data found.  FeNO:   Pathology:   Echocardiogram:   Heart Catheterization:     Assessment & Plan:     ICD-10-CM   1. Lung nodule  R91.1 Procedural/ Surgical Case Request: ROBOTIC ASSISTED NAVIGATIONAL BRONCHOSCOPY    Ambulatory referral to Pulmonology    CT Super D Chest Wo Contrast    2. Abnormal PET scan, lung  R94.2     3. Tobacco abuse  Z72.0     4. Renal mass  N28.89     5. History of nephrectomy  Z90.5        Discussion:  This is a 71 year old gentleman with a longstanding history of tobacco abuse, 57+ years, smoking 10 cigarettes/day at his max was at 1.5 packs/day, found to have a 1.9 cm left lower lobe nodule concerning now for a malignancy.  He does have a history of renal cell carcinoma status post nephrectomy in 2021.  PET scan reveals hypermetabolic uptake within the left lower lobe nodule concerning for malignancy.  Plan: Today we discussed the utility of robotic assisted navigational bronchoscopy and tissue sampling. Patient is agreeable to proceed. We talked about the risk benefits and alternatives. We will tentatively schedule bronchoscopy on 10/19/2021. Orders have been placed. We appreciate PCC's help with setting up.     Current  Outpatient Medications:    ALPRAZolam (XANAX) 1 MG tablet, Take 1.5 mg by mouth at bedtime., Disp: , Rfl:    cetirizine (ZYRTEC) 10 MG tablet, Take 10 mg by mouth., Disp: , Rfl:    chlorthalidone (HYGROTON) 25 MG tablet, Take 25 mg by  mouth at bedtime., Disp: , Rfl:    docusate sodium (COLACE) 100 MG capsule, Take 1 capsule (100 mg total) by mouth 2 (two) times daily., Disp: , Rfl:    gabapentin (NEURONTIN) 100 MG capsule, Take 100 mg by mouth 3 (three) times daily., Disp: , Rfl:    guaifenesin (HUMIBID E) 400 MG TABS tablet, Take 400 mg by mouth in the morning and at bedtime., Disp: , Rfl:    HYDROcodone-acetaminophen (NORCO) 5-325 MG tablet, Take 1-2 tablets by mouth every 6 (six) hours as needed for moderate pain or severe pain., Disp: 20 tablet, Rfl: 0   omega-3 acid ethyl esters (LOVAZA) 1 g capsule, Take 3,000 mg by mouth at bedtime., Disp: , Rfl:    Polyvinyl Alcohol-Povidone (CLEAR EYES ALL SEASONS) 5-6 MG/ML SOLN, Place 1 drop into both eyes daily as needed (Dry eyes)., Disp: , Rfl:    pravastatin (PRAVACHOL) 40 MG tablet, Take 40 mg by mouth at bedtime., Disp: , Rfl:    sildenafil (VIAGRA) 100 MG tablet, Take 100 mg by mouth daily as needed for erectile dysfunction., Disp: , Rfl:   I spent 42 minutes dedicated to the care of this patient on the date of this encounter to include pre-visit review of records, face-to-face time with the patient discussing conditions above, post visit ordering of testing, clinical documentation with the electronic health record, making appropriate referrals as documented, and communicating necessary findings to members of the patients care team.     Garner Nash, DO [facility] 09/30/2021 9:03 AM

## 2021-09-30 NOTE — Progress Notes (Signed)
Synopsis: Referred in January 2023 for lung nodule by Shirline Frees, MD  Subjective:   PATIENT ID: Chris Holloway GENDER: male DOB: 10/17/1950, MRN: [id number]  Chief Complaint  Patient presents with   Follow-up    Patient says everything is going good. Patient wants to talk about his back constantly itching, says it's worse at night and its been going on for 3 months.     This is a 71 year old gentleman with a past medical history of tobacco abuse, left renal cancer status post left robotic radical nephrectomy in November 2021 staged as a pT1b with 3 multifocal tumors papillary renal cell carcinoma grade 3 with negative margins the dominant tumor in her was 6.5 cm in size.  Also has a stage III chronic kidney disease and a left lower lobe nodule found incidentally on surveillance CT imaging.  [facility] is followed by [facility] with Dr. Tresa Moore.  Patient is a longstanding tobacco abuse history for 57+ years.  Patient had a CT scan follow-up in December 2022 which revealed the persistence of a 1.9 cm left lower lobe pulmonary nodule that is adjacent to the aorta also has some lower lobe smaller supradiaphragmatic lesions and a small nodule within the right upper lobe and right lower lobe both located in the periphery.  He also has evidence of significant centrilobular emphysema.  He is still smoking approximately 10 cigarettes a day.  OV 09/30/2021: Here today for follow-up after recent PET scan.  Nuclear medicine PET scan completed at the end of January 2023.  CT imaging and nuclear medicine imaging reviewed.  He has hypermetabolic uptake within the left lung lesion.  Other small areas of nodularity noted however no distant metastatic disease.  We reviewed PET scan results today.   Past Medical History:  Diagnosis Date   Arthritis    hands back,    Chronic kidney disease    Per Dr. Kenton Kingfisher but not with Dr. Tresa Moore   Hypertension    Stroke [facility]) 1998   mild stroke like symptoms lasted 5  min.     No family history on file.   Past Surgical History:  Procedure Laterality Date   HAND LIGAMENT RECONSTRUCTION Right 1990s   Saw injury   ROBOTIC ASSITED PARTIAL NEPHRECTOMY Left 07/15/2020   Procedure: XI ROBOTIC ASSITED RADICAL NEPHRECTOMY WITH INTRAOPERAATIVE ULTRASOUND;  Surgeon: Alexis Frock, MD;  Location: [facility];  Service: Urology;  Laterality: Left;  3 HRS   SHOULDER ARTHROSCOPY W/ ROTATOR CUFF REPAIR Left 08/2018    Social History   Socioeconomic History   Marital status: Widowed    Spouse name: Not on file   Number of children: Not on file   Years of education: Not on file   Highest education level: Not on file  Occupational History   Not on file  Tobacco Use   Smoking status: Some Days    Packs/day: 1.50    Years: 15.00    Pack years: 22.50    Types: Cigarettes   Smokeless tobacco: Never  Vaping Use   Vaping Use: Never used  Substance and Sexual Activity   Alcohol use: Yes    Comment: 7/wk   Drug use: Never   Sexual activity: Not on file  Other Topics Concern   Not on file  Social History Narrative   Not on file   Social Determinants of Health   Financial Resource Strain: Not on file  Food Insecurity: Not on file  Transportation Needs: Not on file  Physical  Activity: Not on file  Stress: Not on file  Social Connections: Not on file  Intimate Partner Violence: Not on file     No Known Allergies   Outpatient Medications Prior to Visit  Medication Sig Dispense Refill   ALPRAZolam (XANAX) 1 MG tablet Take 1.5 mg by mouth at bedtime.     cetirizine (ZYRTEC) 10 MG tablet Take 10 mg by mouth.     chlorthalidone (HYGROTON) 25 MG tablet Take 25 mg by mouth at bedtime.     docusate sodium (COLACE) 100 MG capsule Take 1 capsule (100 mg total) by mouth 2 (two) times daily.     gabapentin (NEURONTIN) 100 MG capsule Take 100 mg by mouth 3 (three) times daily.     guaifenesin (HUMIBID E) 400 MG TABS tablet Take 400 mg by mouth in the morning and  at bedtime.     HYDROcodone-acetaminophen (NORCO) 5-325 MG tablet Take 1-2 tablets by mouth every 6 (six) hours as needed for moderate pain or severe pain. 20 tablet 0   omega-3 acid ethyl esters (LOVAZA) 1 g capsule Take 3,000 mg by mouth at bedtime.     Polyvinyl Alcohol-Povidone (CLEAR EYES ALL SEASONS) 5-6 MG/ML SOLN Place 1 drop into both eyes daily as needed (Dry eyes).     pravastatin (PRAVACHOL) 40 MG tablet Take 40 mg by mouth at bedtime.     sildenafil (VIAGRA) 100 MG tablet Take 100 mg by mouth daily as needed for erectile dysfunction.     atenolol (TENORMIN) 50 MG tablet Take 50 mg by mouth at bedtime.      predniSONE (DELTASONE) 20 MG tablet Take 40 mg by mouth at bedtime.     triamcinolone cream (KENALOG) 0.5 % Apply 1 application topically in the morning, at noon, and at bedtime.     No facility-administered medications prior to visit.    Review of Systems  Constitutional:  Negative for chills, fever, malaise/fatigue and weight loss.  HENT:  Negative for hearing loss, sore throat and tinnitus.   Eyes:  Negative for blurred vision and double vision.  Respiratory:  Negative for cough, hemoptysis, sputum production, shortness of breath, wheezing and stridor.   Cardiovascular:  Negative for chest pain, palpitations, orthopnea, leg swelling and PND.  Gastrointestinal:  Negative for abdominal pain, constipation, diarrhea, heartburn, nausea and vomiting.  Genitourinary:  Negative for dysuria, hematuria and urgency.  Musculoskeletal:  Negative for joint pain and myalgias.  Skin:  Negative for itching and rash.  Neurological:  Negative for dizziness, tingling, weakness and headaches.  Endo/Heme/Allergies:  Negative for environmental allergies. Does not bruise/bleed easily.  Psychiatric/Behavioral:  Negative for depression. The patient is not nervous/anxious and does not have insomnia.   All other systems reviewed and are negative.   Objective:  Physical Exam Vitals reviewed.   Constitutional:      General: He is not in acute distress.    Appearance: He is well-developed.  HENT:     Head: Normocephalic and atraumatic.  Eyes:     General: No scleral icterus.    Conjunctiva/sclera: Conjunctivae normal.     Pupils: Pupils are equal, round, and reactive to light.  Neck:     Vascular: No JVD.     Trachea: No tracheal deviation.  Cardiovascular:     Rate and Rhythm: Normal rate and regular rhythm.     Heart sounds: Normal heart sounds. No murmur heard. Pulmonary:     Effort: Pulmonary effort is normal. No tachypnea, accessory muscle usage or respiratory  distress.     Breath sounds: No stridor. No wheezing, rhonchi or rales.  Abdominal:     General: There is no distension.     Palpations: Abdomen is soft.     Tenderness: There is no abdominal tenderness.  Musculoskeletal:        General: No tenderness.     Cervical back: Neck supple.  Lymphadenopathy:     Cervical: No cervical adenopathy.  Skin:    General: Skin is warm and dry.     Capillary Refill: Capillary refill takes less than 2 seconds.     Findings: No rash.  Neurological:     Mental Status: He is alert and oriented to person, place, and time.  Psychiatric:        Behavior: Behavior normal.     Vitals:   09/30/21 0858  BP: 118/76  Pulse: 89  Temp: 97.9 F (36.6 C)  TempSrc: Oral  SpO2: 98%  Weight: 169 lb 9.6 oz (76.9 kg)  Height: 5\' 11"  (1.803 m)   98% on RA BMI Readings from Last 3 Encounters:  09/30/21 23.65 kg/m  09/09/21 23.68 kg/m  07/15/20 23.99 kg/m   Wt Readings from Last 3 Encounters:  09/30/21 169 lb 9.6 oz (76.9 kg)  09/09/21 169 lb 12.8 oz (77 kg)  07/15/20 172 lb (78 kg)     CBC    Component Value Date/Time   WBC 11.4 (H) 07/09/2020 1029   RBC 4.32 07/09/2020 1029   HGB 12.1 (L) 07/16/2020 0538   HCT 34.4 (L) 07/16/2020 0538   PLT 305 07/09/2020 1029   MCV 101.2 (H) 07/09/2020 1029   MCH 34.3 (H) 07/09/2020 1029   MCHC 33.9 07/09/2020 1029   RDW  12.8 07/09/2020 1029    Chest Imaging: Dec 2022: Ct chest with LLL 1.9cm nodule adjacent to aorta The patient's images have been independently reviewed by me.    January 2023 nuclear medicine pet imaging: Hypermetabolic uptake within the left lower lobe 1.9 cm nodule adjacent to the aorta.  Concerning for primary bronchogenic carcinoma, no evidence of metastatic disease. The patient's images have been independently reviewed by me.    Pulmonary Functions Testing Results: No flowsheet data found.  FeNO:   Pathology:   Echocardiogram:   Heart Catheterization:     Assessment & Plan:     ICD-10-CM   1. Lung nodule  R91.1 Procedural/ Surgical Case Request: ROBOTIC ASSISTED NAVIGATIONAL BRONCHOSCOPY    Ambulatory referral to Pulmonology    CT Super D Chest Wo Contrast    2. Abnormal PET scan, lung  R94.2     3. Tobacco abuse  Z72.0     4. Renal mass  N28.89     5. History of nephrectomy  Z90.5        Discussion:  This is a 71 year old gentleman with a longstanding history of tobacco abuse, 57+ years, smoking 10 cigarettes/day at his max was at 1.5 packs/day, found to have a 1.9 cm left lower lobe nodule concerning now for a malignancy.  He does have a history of renal cell carcinoma status post nephrectomy in 2021.  PET scan reveals hypermetabolic uptake within the left lower lobe nodule concerning for malignancy.  Plan: Today we discussed the utility of robotic assisted navigational bronchoscopy and tissue sampling. Patient is agreeable to proceed. We talked about the risk benefits and alternatives. We will tentatively schedule bronchoscopy on 10/19/2021. Orders have been placed. We appreciate PCC's help with setting up.     Current  Outpatient Medications:    ALPRAZolam (XANAX) 1 MG tablet, Take 1.5 mg by mouth at bedtime., Disp: , Rfl:    cetirizine (ZYRTEC) 10 MG tablet, Take 10 mg by mouth., Disp: , Rfl:    chlorthalidone (HYGROTON) 25 MG tablet, Take 25 mg by  mouth at bedtime., Disp: , Rfl:    docusate sodium (COLACE) 100 MG capsule, Take 1 capsule (100 mg total) by mouth 2 (two) times daily., Disp: , Rfl:    gabapentin (NEURONTIN) 100 MG capsule, Take 100 mg by mouth 3 (three) times daily., Disp: , Rfl:    guaifenesin (HUMIBID E) 400 MG TABS tablet, Take 400 mg by mouth in the morning and at bedtime., Disp: , Rfl:    HYDROcodone-acetaminophen (NORCO) 5-325 MG tablet, Take 1-2 tablets by mouth every 6 (six) hours as needed for moderate pain or severe pain., Disp: 20 tablet, Rfl: 0   omega-3 acid ethyl esters (LOVAZA) 1 g capsule, Take 3,000 mg by mouth at bedtime., Disp: , Rfl:    Polyvinyl Alcohol-Povidone (CLEAR EYES ALL SEASONS) 5-6 MG/ML SOLN, Place 1 drop into both eyes daily as needed (Dry eyes)., Disp: , Rfl:    pravastatin (PRAVACHOL) 40 MG tablet, Take 40 mg by mouth at bedtime., Disp: , Rfl:    sildenafil (VIAGRA) 100 MG tablet, Take 100 mg by mouth daily as needed for erectile dysfunction., Disp: , Rfl:   I spent 42 minutes dedicated to the care of this patient on the date of this encounter to include pre-visit review of records, face-to-face time with the patient discussing conditions above, post visit ordering of testing, clinical documentation with the electronic health record, making appropriate referrals as documented, and communicating necessary findings to members of the patients care team.     Garner Nash, DO [facility] 09/30/2021 9:03 AM

## 2021-10-15 ENCOUNTER — Ambulatory Visit (INDEPENDENT_AMBULATORY_CARE_PROVIDER_SITE_OTHER)
Admission: RE | Admit: 2021-10-15 | Discharge: 2021-10-15 | Disposition: A | Discharge status: Home / Self Care | Payer: PPO | Source: Ambulatory Visit | Attending: Pulmonary Disease | Admitting: Pulmonary Disease

## 2021-10-15 ENCOUNTER — Encounter (HOSPITAL_COMMUNITY): Payer: Self-pay | Admitting: Pulmonary Disease

## 2021-10-15 ENCOUNTER — Other Ambulatory Visit: Payer: Self-pay

## 2021-10-15 ENCOUNTER — Other Ambulatory Visit: Payer: Self-pay | Admitting: Pulmonary Disease

## 2021-10-15 DIAGNOSIS — I700 Atherosclerosis of aorta: Secondary | ICD-10-CM | POA: Diagnosis not present

## 2021-10-15 DIAGNOSIS — R911 Solitary pulmonary nodule: Secondary | ICD-10-CM | POA: Diagnosis not present

## 2021-10-15 DIAGNOSIS — I10 Essential (primary) hypertension: Secondary | ICD-10-CM | POA: Diagnosis not present

## 2021-10-15 DIAGNOSIS — J40 Bronchitis, not specified as acute or chronic: Secondary | ICD-10-CM | POA: Diagnosis not present

## 2021-10-15 DIAGNOSIS — A6002 Herpesviral infection of other male genital organs: Secondary | ICD-10-CM | POA: Diagnosis not present

## 2021-10-15 DIAGNOSIS — N529 Male erectile dysfunction, unspecified: Secondary | ICD-10-CM | POA: Diagnosis not present

## 2021-10-15 DIAGNOSIS — N183 Chronic kidney disease, stage 3 unspecified: Secondary | ICD-10-CM | POA: Diagnosis not present

## 2021-10-15 DIAGNOSIS — G8929 Other chronic pain: Secondary | ICD-10-CM | POA: Diagnosis not present

## 2021-10-15 DIAGNOSIS — E7800 Pure hypercholesterolemia, unspecified: Secondary | ICD-10-CM | POA: Diagnosis not present

## 2021-10-15 DIAGNOSIS — R918 Other nonspecific abnormal finding of lung field: Secondary | ICD-10-CM | POA: Diagnosis not present

## 2021-10-15 DIAGNOSIS — F5101 Primary insomnia: Secondary | ICD-10-CM | POA: Diagnosis not present

## 2021-10-15 DIAGNOSIS — F17200 Nicotine dependence, unspecified, uncomplicated: Secondary | ICD-10-CM | POA: Diagnosis not present

## 2021-10-15 LAB — SARS CORONAVIRUS 2 (TAT 6-24 HRS): SARS Coronavirus 2: NEGATIVE

## 2021-10-15 NOTE — Progress Notes (Signed)
PCP - Dr. Kenton Kingfisher Cardiologist - denies  EKG - DOS Chest x-ray -  ECHO -  Cardiac Cath -  CPAP -   Aspirin Instructions: Follow your surgeon's instructions on when to stop Aspirin.  If no instructions were given by your surgeon then you will need to call the office to get those instructions.    ERAS Protcol - n/a COVID TEST- negative   Anesthesia review: n/a  -------------  SDW INSTRUCTIONS:  Your procedure is scheduled on Tuesday 2/21. Please report to [facility] Main Entrance "A" at 0530 A.M., and check in at the Admitting office. Call this number if you have problems the morning of surgery: [phone]   Remember: Do not eat or drink after midnight the night before your surgery   Medications to take morning of surgery with a sip of water include: cetirizine (ZYRTEC)  gabapentin (NEURONTIN)  albuterol (VENTOLIN HFA) --- Please bring all inhalers with you the day of surgery.   As of today, STOP taking any Aspirin (unless otherwise instructed by your surgeon), Aleve, Naproxen, Ibuprofen, Motrin, Advil, Goody's, BC's, all herbal medications, fish oil, and all vitamins.    The Morning of Surgery Do not wear jewelry Do not wear lotions, powders, colognes, or deodorant Do not bring valuables to the hospital. [facility] is not responsible for any belongings or valuables.  If you are a smoker, DO NOT Smoke 24 hours prior to surgery  If you wear a CPAP at night please bring your mask the morning of surgery   Remember that you must have someone to transport you home after your surgery, and remain with you for 24 hours if you are discharged the same day.  Please bring cases for contacts, glasses, hearing aids, dentures or bridgework because it cannot be worn into surgery.   Patients discharged the day of surgery will not be allowed to drive home.   Please shower the NIGHT BEFORE/MORNING OF SURGERY (use antibacterial soap like DIAL soap if possible). Wear comfortable clothes  the morning of surgery. Oral Hygiene is also important to reduce your risk of infection.  Remember - BRUSH YOUR TEETH THE MORNING OF SURGERY WITH YOUR REGULAR TOOTHPASTE  Patient denies shortness of breath, fever, cough and chest pain.

## 2021-10-18 NOTE — Anesthesia Preprocedure Evaluation (Addendum)
Anesthesia Evaluation  Patient identified by MRN, date of birth, ID band Patient awake    Reviewed: Allergy & Precautions, NPO status , Patient's Chart, lab work & pertinent test results  History of Anesthesia Complications Negative for: history of anesthetic complications  Airway Mallampati: I  TM Distance: >3 FB Neck ROM: Full    Dental  (+) Dental Advisory Given   Pulmonary COPD,  COPD inhaler, Current Smoker and Patient abstained from smoking.,  10/15/2021 SARS coronavirus NEG   breath sounds clear to auscultation       Cardiovascular hypertension, Pt. on medications (-) angina Rhythm:Regular Rate:Normal     Neuro/Psych TIA   GI/Hepatic negative GI ROS, Neg liver ROS,   Endo/Other  negative endocrine ROS  Renal/GU Renal InsufficiencyRenal diseasePartial nephrectomy     Musculoskeletal  (+) Arthritis ,   Abdominal   Peds  Hematology negative hematology ROS (+)   Anesthesia Other Findings   Reproductive/Obstetrics                            Anesthesia Physical Anesthesia Plan  ASA: 3  Anesthesia Plan: General   Post-op Pain Management: Tylenol PO (pre-op)* and Minimal or no pain anticipated   Induction: Intravenous  PONV Risk Score and Plan: 1 and Ondansetron and Dexamethasone  Airway Management Planned: Oral ETT  Additional Equipment: None  Intra-op Plan:   Post-operative Plan: Extubation in OR  Informed Consent: I have reviewed the patients History and Physical, chart, labs and discussed the procedure including the risks, benefits and alternatives for the proposed anesthesia with the patient or authorized representative who has indicated his/her understanding and acceptance.     Dental advisory given  Plan Discussed with: CRNA and Surgeon  Anesthesia Plan Comments:        Anesthesia Quick Evaluation

## 2021-10-19 ENCOUNTER — Ambulatory Visit (HOSPITAL_COMMUNITY): Payer: PPO

## 2021-10-19 ENCOUNTER — Ambulatory Visit (HOSPITAL_COMMUNITY): Payer: PPO | Admitting: Anesthesiology

## 2021-10-19 ENCOUNTER — Other Ambulatory Visit: Payer: Self-pay

## 2021-10-19 ENCOUNTER — Ambulatory Visit (HOSPITAL_BASED_OUTPATIENT_CLINIC_OR_DEPARTMENT_OTHER): Payer: PPO | Admitting: Anesthesiology

## 2021-10-19 ENCOUNTER — Encounter (HOSPITAL_COMMUNITY)
Admission: RE | Disposition: A | Discharge status: Home / Self Care | Payer: Self-pay | Source: Home / Self Care | Attending: Pulmonary Disease

## 2021-10-19 ENCOUNTER — Encounter (HOSPITAL_COMMUNITY): Payer: Self-pay | Admitting: Pulmonary Disease

## 2021-10-19 ENCOUNTER — Ambulatory Visit (HOSPITAL_COMMUNITY)
Admission: RE | Admit: 2021-10-19 | Discharge: 2021-10-19 | Disposition: A | Discharge status: Home / Self Care | Payer: PPO | Attending: Pulmonary Disease | Admitting: Pulmonary Disease

## 2021-10-19 DIAGNOSIS — Z9889 Other specified postprocedural states: Secondary | ICD-10-CM

## 2021-10-19 DIAGNOSIS — F17210 Nicotine dependence, cigarettes, uncomplicated: Secondary | ICD-10-CM | POA: Insufficient documentation

## 2021-10-19 DIAGNOSIS — Z8673 Personal history of transient ischemic attack (TIA), and cerebral infarction without residual deficits: Secondary | ICD-10-CM | POA: Diagnosis not present

## 2021-10-19 DIAGNOSIS — R911 Solitary pulmonary nodule: Secondary | ICD-10-CM | POA: Diagnosis not present

## 2021-10-19 DIAGNOSIS — I10 Essential (primary) hypertension: Secondary | ICD-10-CM | POA: Diagnosis not present

## 2021-10-19 DIAGNOSIS — Z419 Encounter for procedure for purposes other than remedying health state, unspecified: Secondary | ICD-10-CM

## 2021-10-19 DIAGNOSIS — N189 Chronic kidney disease, unspecified: Secondary | ICD-10-CM | POA: Insufficient documentation

## 2021-10-19 DIAGNOSIS — M1990 Unspecified osteoarthritis, unspecified site: Secondary | ICD-10-CM | POA: Diagnosis not present

## 2021-10-19 DIAGNOSIS — N2889 Other specified disorders of kidney and ureter: Secondary | ICD-10-CM | POA: Diagnosis not present

## 2021-10-19 DIAGNOSIS — M419 Scoliosis, unspecified: Secondary | ICD-10-CM | POA: Diagnosis not present

## 2021-10-19 DIAGNOSIS — C3412 Malignant neoplasm of upper lobe, left bronchus or lung: Secondary | ICD-10-CM | POA: Diagnosis present

## 2021-10-19 DIAGNOSIS — I700 Atherosclerosis of aorta: Secondary | ICD-10-CM | POA: Diagnosis not present

## 2021-10-19 DIAGNOSIS — Z905 Acquired absence of kidney: Secondary | ICD-10-CM | POA: Insufficient documentation

## 2021-10-19 DIAGNOSIS — J432 Centrilobular emphysema: Secondary | ICD-10-CM | POA: Insufficient documentation

## 2021-10-19 DIAGNOSIS — I129 Hypertensive chronic kidney disease with stage 1 through stage 4 chronic kidney disease, or unspecified chronic kidney disease: Secondary | ICD-10-CM | POA: Diagnosis not present

## 2021-10-19 DIAGNOSIS — C3432 Malignant neoplasm of lower lobe, left bronchus or lung: Secondary | ICD-10-CM | POA: Insufficient documentation

## 2021-10-19 DIAGNOSIS — Z85528 Personal history of other malignant neoplasm of kidney: Secondary | ICD-10-CM | POA: Diagnosis not present

## 2021-10-19 DIAGNOSIS — J449 Chronic obstructive pulmonary disease, unspecified: Secondary | ICD-10-CM

## 2021-10-19 DIAGNOSIS — Z79899 Other long term (current) drug therapy: Secondary | ICD-10-CM | POA: Diagnosis not present

## 2021-10-19 DIAGNOSIS — R918 Other nonspecific abnormal finding of lung field: Secondary | ICD-10-CM | POA: Diagnosis not present

## 2021-10-19 DIAGNOSIS — C3491 Malignant neoplasm of unspecified part of right bronchus or lung: Secondary | ICD-10-CM | POA: Diagnosis not present

## 2021-10-19 HISTORY — PX: FIDUCIAL MARKER PLACEMENT: SHX6858

## 2021-10-19 HISTORY — PX: BRONCHIAL BIOPSY: SHX5109

## 2021-10-19 HISTORY — PX: BRONCHIAL NEEDLE ASPIRATION BIOPSY: SHX5106

## 2021-10-19 HISTORY — PX: VIDEO BRONCHOSCOPY WITH RADIAL ENDOBRONCHIAL ULTRASOUND: SHX6849

## 2021-10-19 LAB — BASIC METABOLIC PANEL
Anion gap: 11 (ref 5–15)
BUN: 28 mg/dL — ABNORMAL HIGH (ref 8–23)
CO2: 26 mmol/L (ref 22–32)
Calcium: 9.4 mg/dL (ref 8.9–10.3)
Chloride: 96 mmol/L — ABNORMAL LOW (ref 98–111)
Creatinine, Ser: 1.78 mg/dL — ABNORMAL HIGH (ref 0.61–1.24)
GFR, Estimated: 41 mL/min — ABNORMAL LOW (ref >60)
Glucose, Bld: 86 mg/dL (ref 70–99)
Potassium: 3.3 mmol/L — ABNORMAL LOW (ref 3.5–5.1)
Sodium: 133 mmol/L — ABNORMAL LOW (ref 135–145)

## 2021-10-19 SURGERY — BRONCHOSCOPY, WITH BIOPSY USING ELECTROMAGNETIC NAVIGATION
Anesthesia: General | Laterality: Left

## 2021-10-19 MED ORDER — ROCURONIUM BROMIDE 10 MG/ML (PF) SYRINGE
PREFILLED_SYRINGE | INTRAVENOUS | Status: DC | PRN
  Administered 2021-10-19: 10 mg via INTRAVENOUS
  Administered 2021-10-19: 60 mg via INTRAVENOUS

## 2021-10-19 MED ORDER — ONDANSETRON HCL 4 MG/2ML IJ SOLN
INTRAMUSCULAR | Status: DC | PRN
  Administered 2021-10-19: 4 mg via INTRAVENOUS

## 2021-10-19 MED ORDER — LIDOCAINE 2% (20 MG/ML) 5 ML SYRINGE
INTRAMUSCULAR | Status: DC | PRN
Start: 2021-10-19 — End: 2021-10-19
  Administered 2021-10-19: 40 mg via INTRAVENOUS

## 2021-10-19 MED ORDER — PHENYLEPHRINE HCL-NACL 20-0.9 MG/250ML-% IV SOLN
INTRAVENOUS | Status: DC | PRN
  Administered 2021-10-19: 50 ug/min via INTRAVENOUS

## 2021-10-19 MED ORDER — LACTATED RINGERS IV SOLN: INTRAVENOUS | Status: DC

## 2021-10-19 MED ORDER — OXYCODONE HCL 5 MG PO TABS: 5.0000 mg | ORAL_TABLET | Freq: Once | ORAL | Status: DC | PRN

## 2021-10-19 MED ORDER — SUGAMMADEX SODIUM 200 MG/2ML IV SOLN
INTRAVENOUS | Status: DC | PRN
Start: 2021-10-19 — End: 2021-10-19
  Administered 2021-10-19: 200 mg via INTRAVENOUS

## 2021-10-19 MED ORDER — OXYCODONE HCL 5 MG/5ML PO SOLN: 5.0000 mg | Freq: Once | ORAL | Status: DC | PRN

## 2021-10-19 MED ORDER — MEPERIDINE HCL 25 MG/ML IJ SOLN: 6.2500 mg | INTRAMUSCULAR | Status: DC | PRN

## 2021-10-19 MED ORDER — FENTANYL CITRATE (PF) 100 MCG/2ML IJ SOLN: 25.0000 ug | INTRAMUSCULAR | Status: DC | PRN

## 2021-10-19 MED ORDER — PROMETHAZINE HCL 25 MG/ML IJ SOLN: 6.2500 mg | INTRAMUSCULAR | Status: DC | PRN

## 2021-10-19 MED ORDER — FENTANYL CITRATE (PF) 250 MCG/5ML IJ SOLN
INTRAMUSCULAR | Status: DC | PRN
  Administered 2021-10-19 (×2): 50 ug via INTRAVENOUS

## 2021-10-19 MED ORDER — PHENYLEPHRINE 40 MCG/ML (10ML) SYRINGE FOR IV PUSH (FOR BLOOD PRESSURE SUPPORT)
PREFILLED_SYRINGE | INTRAVENOUS | Status: DC | PRN
  Administered 2021-10-19: 80 ug via INTRAVENOUS
  Administered 2021-10-19: 120 ug via INTRAVENOUS
  Administered 2021-10-19 (×2): 80 ug via INTRAVENOUS

## 2021-10-19 MED ORDER — ACETAMINOPHEN 500 MG PO TABS
1000.0000 mg | ORAL_TABLET | Freq: Once | ORAL | Status: AC
  Administered 2021-10-19: 1000 mg via ORAL
  Filled 2021-10-19: qty 2

## 2021-10-19 MED ORDER — PROPOFOL 10 MG/ML IV BOLUS
INTRAVENOUS | Status: DC | PRN
  Administered 2021-10-19: 150 mg via INTRAVENOUS

## 2021-10-19 MED ORDER — CHLORHEXIDINE GLUCONATE 0.12 % MT SOLN
15.0000 mL | Freq: Once | OROMUCOSAL | Status: AC
  Administered 2021-10-19: 15 mL via OROMUCOSAL
  Filled 2021-10-19 (×2): qty 15

## 2021-10-19 MED ORDER — DEXAMETHASONE SODIUM PHOSPHATE 10 MG/ML IJ SOLN
INTRAMUSCULAR | Status: DC | PRN
  Administered 2021-10-19: 10 mg via INTRAVENOUS

## 2021-10-19 MED ORDER — EPHEDRINE SULFATE-NACL 50-0.9 MG/10ML-% IV SOSY
PREFILLED_SYRINGE | INTRAVENOUS | Status: DC | PRN
Start: 2021-10-19 — End: 2021-10-19
  Administered 2021-10-19: 5 mg via INTRAVENOUS

## 2021-10-19 MED ORDER — MIDAZOLAM HCL 2 MG/2ML IJ SOLN: 0.5000 mg | Freq: Once | INTRAMUSCULAR | Status: DC | PRN

## 2021-10-19 SURGICAL SUPPLY — 1 items: Superlock fiducial marker ×1 IMPLANT

## 2021-10-19 NOTE — Op Note (Signed)
Video Bronchoscopy with Robotic Assisted Bronchoscopic Navigation   Date of Operation: 10/19/2021   Pre-op Diagnosis: Lung nodule, left lower lobe  Post-op Diagnosis: Lung nodule, left lower lobe  Surgeon: Garner Nash, DO   Assistants: None   Anesthesia: General endotracheal anesthesia  Operation: Flexible video fiberoptic bronchoscopy with robotic assistance and biopsies.  Estimated Blood Loss: Minimal  Complications: None  Indications and History: Chris Holloway is a 71 y.o. male with history of lung nodule, left lower lobe, history of renal cell carcinoma. The risks, benefits, complications, treatment options and expected outcomes were discussed with the patient.  The possibilities of pneumothorax, pneumonia, reaction to medication, pulmonary aspiration, perforation of a viscus, bleeding, failure to diagnose a condition and creating a complication requiring transfusion or operation were discussed with the patient who freely signed the consent.    Description of Procedure: The patient was seen in the Preoperative Area, was examined and was deemed appropriate to proceed.  The patient was taken to [facility] endoscopy room 3, identified as Chris Holloway and the procedure verified as Flexible Video Fiberoptic Bronchoscopy.  A Time Out was held and the above information confirmed.   Prior to the date of the procedure a high-resolution CT scan of the chest was performed. Utilizing ION software program a virtual tracheobronchial tree was generated to allow the creation of distinct navigation pathways to the patient's parenchymal abnormalities. After being taken to the operating room general anesthesia was initiated and the patient  was orally intubated. The video fiberoptic bronchoscope was introduced via the endotracheal tube and a general inspection was performed which showed normal right and left lung anatomy, aspiration of the bilateral mainstems was completed to remove any remaining  secretions. Robotic catheter inserted into patient's endotracheal tube.   Target #1 left lower lobe pulmonary nodule: The distinct navigation pathways prepared prior to this procedure were then utilized to navigate to patient's lesion identified on CT scan. The robotic catheter was secured into place and the vision probe was withdrawn.  Lesion location was approximated using fluoroscopy, and three-dimensional cone beam CT imaging, and radial endobronchial ultrasound for peripheral targeting. Under fluoroscopic guidance transbronchial needle brushings, transbronchial needle biopsies, and transbronchial forceps biopsies were performed to be sent for cytology and pathology.  Following tissue sampling a single fiducial was placed under direct fluoroscopy with a fiducial catheter wire and delivery kit.  At the end of the procedure a general airway inspection was performed and there was no evidence of active bleeding. The bronchoscope was removed.  The patient tolerated the procedure well. There was no significant blood loss and there were no obvious complications. A post-procedural chest x-ray is pending.  Samples Target #1: 1. Transbronchial Wang needle biopsies from left lower lobe 2. Transbronchial forceps biopsies from left lower lobe  Plans:  The patient will be discharged from the PACU to home when recovered from anesthesia and after chest x-ray is reviewed. We will review the cytology, pathology results with the patient when they become available. Outpatient followup will be with Garner Nash, DO.    Garner Nash, DO [facility] 10/19/2021 8:40 AM

## 2021-10-19 NOTE — Transfer of Care (Signed)
Immediate Anesthesia Transfer of Care Note  Patient: Chris Holloway  Procedure(s) Performed: ROBOTIC ASSISTED NAVIGATIONAL BRONCHOSCOPY (Left) RADIAL ENDOBRONCHIAL ULTRASOUND BRONCHIAL NEEDLE ASPIRATION BIOPSIES BRONCHIAL BIOPSIES FIDUCIAL MARKER PLACEMENT  Patient Location: PACU  Anesthesia Type:General  Level of Consciousness: awake, alert  and oriented  Airway & Oxygen Therapy: Patient Spontanous Breathing  Post-op Assessment: Report given to RN and Post -op Vital signs reviewed and stable  Post vital signs: Reviewed and stable  Last Vitals:  Vitals Value Taken Time  BP 116/75 10/19/21 0846  Temp    Pulse 71 10/19/21 0849  Resp 13 10/19/21 0849  SpO2 97 % 10/19/21 0849  Vitals shown include unvalidated device data.  Last Pain:  Vitals:   10/19/21 0641  TempSrc:   PainSc: 0-No pain         Complications: No notable events documented.

## 2021-10-19 NOTE — Discharge Instructions (Addendum)
Flexible Bronchoscopy, Care After This sheet gives you information about how to care for yourself after your test. Your doctor may also give you more specific instructions. If you have problems or questions, contact your doctor. Follow these instructions at home: Eating and drinking Do not eat or drink anything (not even water) for 2 hours after your test, or until your numbing medicine (local anesthetic) wears off. When your numbness is gone and your cough and gag reflexes have come back, you may: Eat only soft foods. Slowly drink liquids. The day after the test, go back to your normal diet. Driving Do not drive for 24 hours if you were given a medicine to help you relax (sedative). Do not drive or use heavy machinery while taking prescription pain medicine. General instructions  Take over-the-counter and prescription medicines only as told by your doctor. Return to your normal activities as told. Ask what activities are safe for you. Do not use any products that have nicotine or tobacco in them. This includes cigarettes and e-cigarettes. If you need help quitting, ask your doctor. Keep all follow-up visits as told by your doctor. This is important. It is very important if you had a tissue sample (biopsy) taken. Get help right away if: You have shortness of breath that gets worse. You get light-headed. You feel like you are going to pass out (faint). You have chest pain. You cough up: More than a little blood. More blood than before. Summary Do not eat or drink anything (not even water) for 2 hours after your test, or until your numbing medicine wears off. Do not use cigarettes. Do not use e-cigarettes. Get help right away if you have chest pain.  This information is not intended to replace advice given to you by your health care provider. Make sure you discuss any questions you have with your health care provider. Document Released: 06/12/2009 Document Revised: 07/28/2017 Document  Reviewed: 09/02/2016 Elsevier Patient Education  2020 [employer].

## 2021-10-19 NOTE — Anesthesia Procedure Notes (Signed)
Procedure Name: Intubation Date/Time: 10/19/2021 7:39 AM Performed by: Carolan Clines, CRNA Pre-anesthesia Checklist: Patient identified, Emergency Drugs available, Suction available and Patient being monitored Patient Re-evaluated:Patient Re-evaluated prior to induction Oxygen Delivery Method: Circle System Utilized Preoxygenation: Pre-oxygenation with 100% oxygen Induction Type: IV induction Ventilation: Mask ventilation without difficulty Laryngoscope Size: Mac and 4 Grade View: Grade I Tube type: Oral Tube size: 8.5 mm Number of attempts: 1 Airway Equipment and Method: Stylet Placement Confirmation: ETT inserted through vocal cords under direct vision, positive ETCO2 and breath sounds checked- equal and bilateral Secured at: 23 cm Tube secured with: Tape Dental Injury: Teeth and Oropharynx as per pre-operative assessment

## 2021-10-19 NOTE — Interval H&P Note (Signed)
History and Physical Interval Note:  10/19/2021 7:09 AM  Chris Holloway  has presented today for surgery, with the diagnosis of Lung nodule.  The various methods of treatment have been discussed with the patient and family. After consideration of risks, benefits and other options for treatment, the patient has consented to  Procedure(s) with comments: ROBOTIC ASSISTED NAVIGATIONAL BRONCHOSCOPY (Left) - ION w/ CIOS as a surgical intervention.  The patient's history has been reviewed, patient examined, no change in status, stable for surgery.  I have reviewed the patient's chart and labs.  Questions were answered to the patient's satisfaction.     Royal

## 2021-10-19 NOTE — Anesthesia Postprocedure Evaluation (Signed)
Anesthesia Post Note  Patient: ROLLYN SCIALDONE  Procedure(s) Performed: ROBOTIC ASSISTED NAVIGATIONAL BRONCHOSCOPY (Left) RADIAL ENDOBRONCHIAL ULTRASOUND BRONCHIAL NEEDLE ASPIRATION BIOPSIES BRONCHIAL BIOPSIES FIDUCIAL MARKER PLACEMENT     Patient location during evaluation: PACU Anesthesia Type: General Level of consciousness: awake and alert, patient cooperative and oriented Pain management: pain level controlled Vital Signs Assessment: post-procedure vital signs reviewed and stable Respiratory status: spontaneous breathing, nonlabored ventilation and respiratory function stable Cardiovascular status: blood pressure returned to baseline and stable Postop Assessment: no apparent nausea or vomiting and adequate PO intake Anesthetic complications: no   No notable events documented.  Last Vitals:  Vitals:   10/19/21 0615 10/19/21 0845  BP: 122/77 116/75  Pulse: 66 76  Resp: 18 18  Temp: 36.6 C 36.6 C  SpO2: 100% 97%    Last Pain:  Vitals:   10/19/21 0845  TempSrc:   PainSc: 0-No pain                 [place] Blizzard,E. Doniqua Saxby

## 2021-10-20 ENCOUNTER — Encounter (HOSPITAL_COMMUNITY): Payer: Self-pay | Admitting: Pulmonary Disease

## 2021-10-22 LAB — CYTOLOGY - NON PAP

## 2021-10-26 ENCOUNTER — Encounter: Payer: Self-pay | Admitting: Acute Care

## 2021-10-26 ENCOUNTER — Telehealth: Payer: Self-pay | Admitting: Radiation Oncology

## 2021-10-26 ENCOUNTER — Ambulatory Visit: Payer: PPO | Admitting: Acute Care

## 2021-10-26 ENCOUNTER — Other Ambulatory Visit: Payer: Self-pay

## 2021-10-26 ENCOUNTER — Encounter: Payer: Self-pay | Admitting: *Deleted

## 2021-10-26 ENCOUNTER — Telehealth: Payer: Self-pay | Admitting: Physician Assistant

## 2021-10-26 VITALS — BP 110/70 | HR 67 | Temp 98.0°F | Ht 71.0 in | Wt 167.6 lb

## 2021-10-26 DIAGNOSIS — C3490 Malignant neoplasm of unspecified part of unspecified bronchus or lung: Secondary | ICD-10-CM

## 2021-10-26 DIAGNOSIS — F17210 Nicotine dependence, cigarettes, uncomplicated: Secondary | ICD-10-CM

## 2021-10-26 DIAGNOSIS — R911 Solitary pulmonary nodule: Secondary | ICD-10-CM

## 2021-10-26 DIAGNOSIS — Z720 Tobacco use: Secondary | ICD-10-CM

## 2021-10-26 NOTE — Progress Notes (Signed)
Oncology Nurse Navigator Documentation  Oncology Nurse Navigator Flowsheets 10/26/2021  Navigator Follow Up Date: 11/09/2021  Navigator Follow Up Reason: New Patient Appointment  Navigator Location [facility]  Referral Date to RadOnc/MedOnc 10/26/2021  Navigator Encounter Type Other:  Patient Visit Type Other  Treatment Phase Pre-Tx/Tx Discussion/I received referral on Chris Holloway today. I updated new patient coordinator to call and schedule with [name].    Barriers/Navigation Needs Coordination of Care  Interventions Coordination of Care  Acuity Level 2-Minimal Needs (1-2 Barriers Identified)  Coordination of Care Other  Time Spent with Patient 30

## 2021-10-26 NOTE — Telephone Encounter (Signed)
Scheduled appt per 2/28 referral. Pt is aware of appt date and time. Pt is aware to arrive 15 mins prior to appt time and to bring and updated insurance card. Pt is aware of appt location.

## 2021-10-26 NOTE — Telephone Encounter (Signed)
Left voicemail 2/28 @ 11:25 am for patient to call our office.

## 2021-10-26 NOTE — Patient Instructions (Signed)
It is good to see you today. Your pathology results were positive for adenocarcinoma of the lung. I have referred you to Medical oncology and radiation oncology . Both will give you options for treatments. Please work on quitting smoking. Follow up as needed  Please contact office for sooner follow up if symptoms do not improve or worsen or seek emergency care

## 2021-10-26 NOTE — Progress Notes (Signed)
History of Present Illness Chris Holloway is a 71 y.o. male with tobacco abuse, left renal cancer status post left robotic radical nephrectomy in November 2021 staged as a pT1b with 3 multifocal tumors papillary renal cell carcinoma grade 3 with negative margins the dominant tumor in her was 6.5 cm in size.  Also has a stage III chronic kidney disease and a left lower lobe nodule found incidentally on surveillance CT imaging.  Patient is followed by [facility] with Dr. Tresa Moore. Patient is a longstanding tobacco abuse history for 57+ years.  Patient had a CT scan follow-up in December 2022 which revealed the persistence of a 1.9 cm left lower lobe pulmonary nodule that is adjacent to the aorta also has some lower lobe smaller supradiaphragmatic lesions and a small nodule within the right upper lobe and right lower lobe both located in the periphery.   This nodule has been stable for short period but due to size of nodule he was referred for additional evaluation by Dr. Valeta Harms. PET was done that revealed The dominant nodule medially in the left lower lobe is hypermetabolic, suspicious for malignancy. He underwent robotic assisted navigational bronchoscopy and tissue sampling on 10/19/2021 per Dr. Valeta Harms.   10/26/2021 Pt. Presents for follow up after robotic assisted navigational bronchoscopy and tissue sampling on 10/19/2021. Pt. States he is doing well post procedure. No hemoptysis or other complaints. No sore throat. He went to work after the procedure.  We discussed that his biopsy was positive for adenocarcinoma. He states he is not interested in chemo, but may consider radiation therapy. I have asked him to consider a consult with medical oncology, in addition to radiation oncology ,  which he has agreed to, so they can review options for treatment.Marland Kitchen He wants to maintain his current quality of life. He is not interested in surgery. We discussed smoking cessation. He has been smoking about 12 cigarettes a  day, but when he is with his girlfriend he rarely smokes. We discussed that when people are bored , or alone they tend to smoke more. I have asked him to see which activities he associates with smoking, and to try to modify his behavior , or substitute activities to help with boredom. He is in agreement with the above plan. He understands he will get calls from oncology to get these OV scheduled.   Test Results: Cytology 10/19/2021  FINAL MICROSCOPIC DIAGNOSIS:  A. LUNG, LLL, FINE NEEDLE ASPIRATION:  - Malignant cells present consistent with adenocarcinoma of the lung.  TTF-1 staining is strongly positive in the malignant cells, consistent  with the diagnosis.  CK5/6 is negative.   PET Scan Chest 09/21/2021 There are no hypermetabolic mediastinal, hilar or axillary lymph nodes. The dominant subpleural nodule medially in the left lower lobe adjacent to the descending thoracic aorta is hypermetabolic. This nodule measures 2.0 x 1.4 cm on image 42/8 and has an SUV max of 7.0, consistent with malignancy. There are numerous other smaller nodules which are suboptimally evaluated by PET-CT due to their size. Numerous small nodules above the left hemidiaphragm are unchanged from the previous CTs and do not appear hypermetabolic. There are new small ill-defined right middle lobe nodules measuring up to 5 mm on image 47/8 which do appear mildly hypermetabolic (SUV max 2.3).   CBC Latest Ref Rng & Units 07/16/2020 07/15/2020 07/09/2020  WBC 4.0 - 10.5 K/uL - - 11.4(H)  Hemoglobin 13.0 - 17.0 g/dL 12.1(L) 13.7 14.8  Hematocrit 39.0 - 52.0 %  34.4(L) 39.8 43.7  Platelets 150 - 400 K/uL - - 305    BMP Latest Ref Rng & Units 10/19/2021 07/16/2020 07/15/2020  Glucose 70 - 99 mg/dL 86 106(H) 95  BUN 8 - 23 mg/dL 28(H) 26(H) 23  Creatinine 0.61 - 1.24 mg/dL 1.78(H) 1.70(H) 1.68(H)  Sodium 135 - 145 mmol/L 133(L) 129(L) 131(L)  Potassium 3.5 - 5.1 mmol/L 3.3(L) 3.7 3.5  Chloride 98 - 111 mmol/L 96(L)  92(L) 95(L)  CO2 22 - 32 mmol/L 26 21(L) 20(L)  Calcium 8.9 - 10.3 mg/dL 9.4 8.4(L) 8.8(L)    BNP No results found for: BNP  ProBNP No results found for: PROBNP  PFT No results found for: FEV1PRE, FEV1POST, FVCPRE, FVCPOST, TLC, DLCOUNC, PREFEV1FVCRT, PSTFEV1FVCRT  DG Chest Port 1 View  Result Date: 10/19/2021 CLINICAL DATA:  S/P bronchoscopy with biopsy left lung EXAM: PORTABLE CHEST - 1 VIEW COMPARISON:  08/08/2021 FINDINGS: No pneumothorax. Coarse somewhat attenuated pulmonary bronchovascular markings. Left infrahilar metallic marker. No confluent infiltrate or overt edema. Heart size and mediastinal contours are within normal limits. Aortic Atherosclerosis (ICD10-170.0). No effusion. Mild thoracic levoscoliosis. IMPRESSION: No pneumothorax post bronchoscopy Electronically Signed   By: Lucrezia Europe M.D.   On: 10/19/2021 09:16   CT Super D Chest Wo Contrast  Result Date: 10/17/2021 CLINICAL DATA:  Super D protocol for EMB. Left lower lobe pulmonary nodules. EXAM: CT CHEST WITHOUT CONTRAST TECHNIQUE: Multidetector CT imaging of the chest was performed using thin slice collimation for electromagnetic bronchoscopy planning purposes, without intravenous contrast. RADIATION DOSE REDUCTION: This exam was performed according to the departmental dose-optimization program which includes automated exposure control, adjustment of the mA and/or kV according to patient size and/or use of iterative reconstruction technique. COMPARISON:  Chest CT 08/09/2021 and PET-CT 09/21/2021 FINDINGS: Cardiovascular: The heart is normal in size. No pericardial effusion. There is mild tortuosity and moderate age advanced atherosclerotic calcifications involving the thoracic aorta. Age advanced three-vessel coronary artery calcifications again noted. Calcifications also noted in the region of the aortic valve. Mediastinum/Nodes: No mediastinal or hilar mass or lymphadenopathy. The esophagus is grossly normal. Lungs/Pleura:  The left lower lobe pulmonary lesion adjacent to the descending thoracic aorta is stable measuring approximately the 16.5 x 13.5 mm. This was hypermetabolic on the PET-CT. There also several other smaller pulmonary nodules in the left lower lobe along with small nodules in the right middle lobe and scattered subpleural nodules in both lungs. No acute overlying pulmonary process. No pleural effusions. Upper Abdomen: Stable surgical changes from a left nephrectomy. Stable calcified granuloma the liver. Age advanced vascular calcifications are stable. Musculoskeletal: No significant bony findings. IMPRESSION: 1. Stable 16.5 x 13.5 mm left lower lobe pulmonary nodule adjacent to the descending thoracic aorta. 2. Multiple other smaller pulmonary nodules are unchanged also. 3. No mediastinal or hilar mass or adenopathy. 4. Age advanced atherosclerotic calcifications involving the thoracic and abdominal aorta and branch vessels including the coronary arteries. 5. Stable surgical changes from a left nephrectomy. 6. Aortic atherosclerosis. Aortic Atherosclerosis (ICD10-I70.0). Electronically Signed   By: Marijo Sanes M.D.   On: 10/17/2021 10:25   DG C-ARM BRONCHOSCOPY  Result Date: 10/19/2021 C-ARM BRONCHOSCOPY: Fluoroscopy was utilized by the requesting physician.  No radiographic interpretation.     Past medical hx Past Medical History:  Diagnosis Date   Arthritis    hands back,    Chronic kidney disease    Per Dr. Kenton Kingfisher but not with Dr. Tresa Moore   Hypertension    Stroke [facility]) 1998  mild stroke like symptoms lasted 5 min.     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 He  has a 22.50 pack-year smoking history. He has never used smokeless tobacco. He reports that he does not currently use alcohol. He reports that he does not use drugs.  Tobacco Cessation: Currently smoking 10 cigarettes daily. Counseled to quit.  I have spent 4 minutes counseling patient on smoking cessation this visit. We have reviewed the risks of continued smoking on his current health situation. Patient verbalizes understanding of  continued  smoking and the negative health consequences including worsening of COPD, risk of lung cancer , stroke and heart disease.     Past surgical hx, Family hx, Social hx all reviewed.  Current Outpatient Medications on File Prior to Visit  Medication Sig   albuterol (VENTOLIN HFA) 108 (90 Base) MCG/ACT inhaler Inhale 2 puffs into the lungs every 6 (six) hours as needed for wheezing or shortness of breath.   ALPRAZolam (XANAX) 1 MG tablet Take 1.5 mg by mouth at bedtime.   aspirin EC 81 MG tablet Take 81 mg by mouth at bedtime. Swallow whole.   cetirizine (ZYRTEC) 10 MG tablet Take 10 mg by mouth daily.   chlorthalidone (HYGROTON) 25 MG tablet Take 25 mg by mouth at bedtime.   cholecalciferol (VITAMIN D3) 25 MCG (1000 UNIT) tablet Take 1,000 Units by mouth daily.   CRANBERRY PO Take 2 tablets by mouth daily.   gabapentin (NEURONTIN) 100 MG capsule Take 100 mg by mouth 2 (two) times daily as needed (pain).   guaifenesin (HUMIBID E) 400 MG TABS tablet Take 400 mg by mouth in the morning and at bedtime.   omega-3 acid ethyl esters (LOVAZA) 1 g capsule Take 3,000 mg by mouth at bedtime.   Polyvinyl Alcohol-Povidone (CLEAR EYES ALL SEASONS) 5-6 MG/ML SOLN Place 1 drop into both eyes daily as needed (Dry eyes).   pravastatin (PRAVACHOL) 40 MG tablet Take 40 mg by mouth at bedtime.   sildenafil (VIAGRA) 100 MG tablet Take 100 mg by mouth daily as needed for erectile dysfunction.   triamcinolone (NASACORT) 55 MCG/ACT AERO nasal inhaler Place 2 sprays into the nose  daily as needed (allergies).   Turmeric 500 MG CAPS Take 500 mg by mouth in the morning and at bedtime.   zinc gluconate 50 MG tablet Take 50 mg by mouth at bedtime.   No current facility-administered medications on file prior to visit.     No Known Allergies  Review Of Systems:  Constitutional:   No  weight loss, night sweats,  Fevers, chills, fatigue, or  lassitude.  HEENT:   No headaches,  Difficulty swallowing,  Tooth/dental problems, or  Sore throat,                No sneezing, itching, ear ache, nasal congestion, post nasal drip,   CV:  No chest pain,  Orthopnea, PND, swelling in lower extremities, anasarca, dizziness, palpitations, syncope.   GI  No heartburn, indigestion, abdominal pain, nausea, vomiting, diarrhea, change in bowel habits, loss of  appetite, bloody stools.   Resp: No shortness of breath with exertion or at rest.  No excess mucus, no productive cough,  No non-productive cough,  No coughing up of blood.  No change in color of mucus.  No wheezing.  No chest wall deformity  Skin: no rash or lesions.  GU: no dysuria, change in color of urine, no urgency or frequency.  No flank pain, no hematuria   MS:  No joint pain or swelling.  No decreased range of motion.  No back pain.  Psych:  No change in mood or affect. No depression or anxiety.  No memory loss.   Vital Signs BP 110/70 (BP Location: Left Arm, Patient Position: Sitting, Cuff Size: Normal)    Pulse 67    Temp 98 F (36.7 C) (Oral)    Ht 5\' 11"  (1.803 m)    Wt 167 lb 9.6 oz (76 kg)    SpO2 100%    BMI 23.38 kg/m    Physical Exam:  General- No distress,  A&Ox3, pleasant ENT: No sinus tenderness, TM clear, pale nasal mucosa, no oral exudate,no post nasal drip, no LAN Cardiac: S1, S2, regular rate and rhythm, no murmur Chest: No wheeze/ rales/ dullness; no accessory muscle use, no nasal flaring, no sternal retractions Abd.: Soft Non-tender, ND, BS +, Body mass index is 23.38 kg/m.  Ext: No clubbing  cyanosis, edema Neuro:  normal strength, MAE x 4, A&O x 3 Skin: No rashes, warm and dry, No lesions Psych: normal mood and behavior   Assessment/Plan Malignant LLL pulmonary nodule ( Adenocarcinoma)  Tobacco Abuse  Plan Your pathology results were positive for adenocarcinoma of the lung. I have referred you to Medical oncology and radiation oncology . Both will give you options for treatments. Please work on quitting smoking. Follow up as needed  Please contact office for sooner follow up if symptoms do not improve or worsen or seek emergency care   Tobacco Abuse Plan Smoking cessation counseling done   I spent 40 minutes dedicated to the care of this patient on the date of this encounter to include pre-visit review of records, face-to-face time with the patient discussing conditions above, post visit ordering of testing, clinical documentation with the electronic health record, making appropriate referrals as documented, and communicating necessary information to the patient's healthcare team.    Magdalen Spatz, NP 10/26/2021  10:03 AM

## 2021-10-28 ENCOUNTER — Other Ambulatory Visit: Payer: Self-pay | Admitting: *Deleted

## 2021-10-28 NOTE — Progress Notes (Signed)
The proposed treatment discussed in cancer conference is for discussion purpose only and is not a binding recommendation. The patient was not physically examined nor present for their treatment options. Therefore, final treatment plans cannot be decided.  ?

## 2021-11-01 NOTE — Progress Notes (Signed)
Thoracic Location of Tumor / Histology: Left Lower Lobe Lung ? ?Patient presented in December 2022 for surveillance scans for his renal cell carcinoma. ? ?Bronchoscopy 10/19/2021 ? ?CT Super D Chest 10/15/2021: Stable 16.5 x 13.5 mm left lower lobe pulmonary nodule adjacent to the descending thoracic aorta.  Multiple other smaller pulmonary nodules are unchanged also.  No mediastinal or hilar mass or adenopathy. ? ?PET 09/21/2021: There are no hypermetabolic mediastinal, hilar or axillary lymph nodes. The dominant subpleural nodule medially in the left lower lobe adjacent to the descending thoracic aorta is hypermetabolic.  This nodule measures 2.0 x 1.4 cm on image 42/8 and has an SUV max of 7.0, consistent with malignancy. There are numerous other smaller nodules which are suboptimally evaluated by PET-CT due to their size. Numerous small nodules above the left hemidiaphragm are unchanged from the previous CTs and do not appear hypermetabolic.  There are new small ill-defined right middle lobe nodules measuring up to 5 mm on image 47/8 which do appear mildly hypermetabolic (SUV max 2.3). Other scattered small nodules are grossly unchanged from the chest CT of 6 weeks ago. ? ?CT 07/2021: Persistent nodule in the left lower lobe measuring 1.9 cm. ? ? ?Biopsies of Left Lower Lobe Lung 10/19/2021 ? ? ? ? ?Tobacco/Marijuana/Snuff/ETOH use: Current Smoker ? ?Past/Anticipated interventions by cardiothoracic surgery, if any:  ?-Not suitable for surgical resection. ? ? ?Past/Anticipated interventions by medical oncology, if any:  ?[name] / Dr. Julien Nordmann 11/09/2021 ? ? ? ?Signs/Symptoms ?Weight changes, if any: No, stable. ?Respiratory complaints, if any: He reports overall his breathing is doing well.  Gets a little winded with vigorous activities. ?Hemoptysis, if any: He reports daily cough due to smoking and sinus drainage.  Denies hemoptysis. ?Pain issues, if any:  No ? ?SAFETY ISSUES: ?Prior radiation? No ?Pacemaker/ICD?   No ?Possible current pregnancy? N/a ?Is the patient on methotrexate? No ? ?Current Complaints / other details:   ?

## 2021-11-01 NOTE — Progress Notes (Signed)
Radiation Oncology         [phone] ________________________________  Name: Chris Holloway        MRN: [id number]  Date of Service: 11/02/2021 DOB: 1951/06/06  [id number], Chris Saxon, MD  Garner Nash, DO     REFERRING PHYSICIAN: Garner Nash, DO   DIAGNOSIS: The encounter diagnosis was Primary malignant neoplasm of left lower lobe of lung ([place]).   HISTORY OF PRESENT ILLNESS: Chris Holloway is a 71 y.o. male seen at the request of Dr. Valeta Harms for diagnosis of left renal cell carcinoma who underwent robotic radical nephrectomy in 2021 who has been followed in surveillance.  Dr. Tresa Moore continues to see him.  He had a CT scan for follow-up of his disease in December 2022 that showed a persistent nodule in the left lower lobe measuring 1.9 cm, given the size he was referred for evaluation with pulmonary medicine and a PET scan on 09/21/2021 showed a 2 cm left lower lobe nodule with an SUV max of 7 numerous other smaller nodules were suboptimally evaluated due to size and other numerous nodules were unchanged from prior scans without hypermetabolism.  No other findings concerning for metastatic disease are noted, he underwent bronchoscopy on 10/19/2021 which showed malignant cells consistent with adenocarcinoma and the fine-needle aspirate of the left lower lobe.  The patient is not interested in surgical resection and rather, is seen to discuss stereotactic body radiotherapy (SBRT) as definitive treatment.    PREVIOUS RADIATION THERAPY: No   PAST MEDICAL HISTORY:  Past Medical History:  Diagnosis Date   Arthritis    hands back,    Chronic kidney disease    Per Dr. Kenton Kingfisher but not with Dr. Tresa Moore   Hypertension    Stroke [facility]) 1998   mild stroke like symptoms lasted 5 min.       PAST SURGICAL HISTORY: Past Surgical History:  Procedure Laterality Date   BRONCHIAL BIOPSY  10/19/2021   Procedure: BRONCHIAL BIOPSIES;  Surgeon: Garner Nash, DO;  Location: [facility];  Service:  Pulmonary;;   BRONCHIAL NEEDLE ASPIRATION BIOPSY  10/19/2021   Procedure: BRONCHIAL NEEDLE ASPIRATION BIOPSIES;  Surgeon: Garner Nash, DO;  Location: [facility];  Service: Pulmonary;;   FIDUCIAL MARKER PLACEMENT  10/19/2021   Procedure: FIDUCIAL MARKER PLACEMENT;  Surgeon: Garner Nash, DO;  Location: [place] [facility];  Service: Pulmonary;;   HAND LIGAMENT RECONSTRUCTION Right 1990s   Saw injury   ROBOTIC ASSITED PARTIAL NEPHRECTOMY Left 07/15/2020   Procedure: XI ROBOTIC ASSITED RADICAL NEPHRECTOMY WITH INTRAOPERAATIVE ULTRASOUND;  Surgeon: Alexis Frock, MD;  Location: [facility];  Service: Urology;  Laterality: Left;  3 HRS   SHOULDER ARTHROSCOPY W/ ROTATOR CUFF REPAIR Left 08/2018   VIDEO BRONCHOSCOPY WITH RADIAL ENDOBRONCHIAL ULTRASOUND  10/19/2021   Procedure: RADIAL ENDOBRONCHIAL ULTRASOUND;  Surgeon: Garner Nash, DO;  Location: [place] [facility];  Service: Pulmonary;;     FAMILY HISTORY: History reviewed. No pertinent family history.   SOCIAL HISTORY:  reports that he has been smoking cigarettes. He has a 22.50 pack-year smoking history. He has never used smokeless tobacco. He reports that he does not currently use alcohol. He reports that he does not use drugs.  The patient is widowed and lives in [place], but is in a relationship.  He works as a Scientist, research (physical sciences). He is very active and walks 5+ miles per day and has for more than 30 years.   ALLERGIES: Patient has no known allergies.   MEDICATIONS:  Current Outpatient  Medications  Medication Sig Dispense Refill   albuterol (VENTOLIN HFA) 108 (90 Base) MCG/ACT inhaler Inhale 2 puffs into the lungs every 6 (six) hours as needed for wheezing or shortness of breath.     ALPRAZolam (XANAX) 1 MG tablet Take 1.5 mg by mouth at bedtime.     aspirin EC 81 MG tablet Take 81 mg by mouth at bedtime. Swallow whole.     cetirizine (ZYRTEC) 10 MG tablet Take 10 mg by mouth daily.     chlorthalidone (HYGROTON) 25 MG tablet Take 25 mg  by mouth at bedtime.     cholecalciferol (VITAMIN D3) 25 MCG (1000 UNIT) tablet Take 1,000 Units by mouth daily.     CRANBERRY PO Take 2 tablets by mouth daily.     gabapentin (NEURONTIN) 100 MG capsule Take 100 mg by mouth 2 (two) times daily as needed (pain).     guaifenesin (HUMIBID E) 400 MG TABS tablet Take 400 mg by mouth in the morning and at bedtime.     omega-3 acid ethyl esters (LOVAZA) 1 g capsule Take 3,000 mg by mouth at bedtime.     Polyvinyl Alcohol-Povidone (CLEAR EYES ALL SEASONS) 5-6 MG/ML SOLN Place 1 drop into both eyes daily as needed (Dry eyes).     pravastatin (PRAVACHOL) 40 MG tablet Take 40 mg by mouth at bedtime.     sildenafil (VIAGRA) 100 MG tablet Take 100 mg by mouth daily as needed for erectile dysfunction.     triamcinolone (NASACORT) 55 MCG/ACT AERO nasal inhaler Place 2 sprays into the nose daily as needed (allergies).     Turmeric 500 MG CAPS Take 500 mg by mouth in the morning and at bedtime.     zinc gluconate 50 MG tablet Take 50 mg by mouth at bedtime.     No current facility-administered medications for this encounter.     REVIEW OF SYSTEMS: On review of systems, the patient reports that he is doing well. He denies any shortness of breath, cough, hemoptysis since bronchoscopy, weight loss, or new aches or pains. No other complaints are noted.      PHYSICAL EXAM:  Wt Readings from Last 3 Encounters:  11/02/21 168 lb 8 oz (76.4 kg)  10/26/21 167 lb 9.6 oz (76 kg)  10/19/21 165 lb (74.8 kg)   Temp Readings from Last 3 Encounters:  11/02/21 (!) 96.9 F (36.1 C) (Temporal)  10/26/21 98 F (36.7 C) (Oral)  10/19/21 97.9 F (36.6 C)   BP Readings from Last 3 Encounters:  11/02/21 121/77  10/26/21 110/70  10/19/21 120/81   Pulse Readings from Last 3 Encounters:  11/02/21 70  10/26/21 67  10/19/21 77   Pain Assessment Pain Score: 0-No pain/10  In general this is a well appearing caucasian male in no acute distress. He's alert and oriented  x4 and appropriate throughout the examination. Cardiopulmonary assessment is negative for acute distress and he exhibits normal effort.     ECOG = 0  0 - Asymptomatic (Fully active, able to carry on all predisease activities without restriction)  1 - Symptomatic but completely ambulatory (Restricted in physically strenuous activity but ambulatory and able to carry out work of a light or sedentary nature. For example, light housework, office work)  2 - Symptomatic, <50% in bed during the day (Ambulatory and capable of all self care but unable to carry out any work activities. Up and about more than 50% of waking hours)  3 - Symptomatic, >50% in bed, but  not bedbound (Capable of only limited self-care, confined to bed or chair 50% or more of waking hours)  4 - Bedbound (Completely disabled. Cannot carry on any self-care. Totally confined to bed or chair)  5 - Death   Eustace Pen MM, Creech RH, Tormey DC, et al. [phone]). "Toxicity and response criteria of the [facility]". [place] Oncol. 5 (6): 649-55    LABORATORY DATA:  Lab Results  Component Value Date   WBC 11.4 (H) 07/09/2020   HGB 12.1 (L) 07/16/2020   HCT 34.4 (L) 07/16/2020   MCV 101.2 (H) 07/09/2020   PLT 305 07/09/2020   Lab Results  Component Value Date   NA 133 (L) 10/19/2021   K 3.3 (L) 10/19/2021   CL 96 (L) 10/19/2021   CO2 26 10/19/2021   No results found for: ALT, AST, GGT, ALKPHOS, BILITOT    RADIOGRAPHY: DG Chest Port 1 View  Result Date: 10/19/2021 CLINICAL DATA:  S/P bronchoscopy with biopsy left lung EXAM: PORTABLE CHEST - 1 VIEW COMPARISON:  08/08/2021 FINDINGS: No pneumothorax. Coarse somewhat attenuated pulmonary bronchovascular markings. Left infrahilar metallic marker. No confluent infiltrate or overt edema. Heart size and mediastinal contours are within normal limits. Aortic Atherosclerosis (ICD10-170.0). No effusion. Mild thoracic levoscoliosis. IMPRESSION: No pneumothorax post  bronchoscopy Electronically Signed   By: Lucrezia Europe M.D.   On: 10/19/2021 09:16   CT Super D Chest Wo Contrast  Result Date: 10/17/2021 CLINICAL DATA:  Super D protocol for EMB. Left lower lobe pulmonary nodules. EXAM: CT CHEST WITHOUT CONTRAST TECHNIQUE: Multidetector CT imaging of the chest was performed using thin slice collimation for electromagnetic bronchoscopy planning purposes, without intravenous contrast. RADIATION DOSE REDUCTION: This exam was performed according to the departmental dose-optimization program which includes automated exposure control, adjustment of the mA and/or kV according to patient size and/or use of iterative reconstruction technique. COMPARISON:  Chest CT 08/09/2021 and PET-CT 09/21/2021 FINDINGS: Cardiovascular: The heart is normal in size. No pericardial effusion. There is mild tortuosity and moderate age advanced atherosclerotic calcifications involving the thoracic aorta. Age advanced three-vessel coronary artery calcifications again noted. Calcifications also noted in the region of the aortic valve. Mediastinum/Nodes: No mediastinal or hilar mass or lymphadenopathy. The esophagus is grossly normal. Lungs/Pleura: The left lower lobe pulmonary lesion adjacent to the descending thoracic aorta is stable measuring approximately the 16.5 x 13.5 mm. This was hypermetabolic on the PET-CT. There also several other smaller pulmonary nodules in the left lower lobe along with small nodules in the right middle lobe and scattered subpleural nodules in both lungs. No acute overlying pulmonary process. No pleural effusions. Upper Abdomen: Stable surgical changes from a left nephrectomy. Stable calcified granuloma the liver. Age advanced vascular calcifications are stable. Musculoskeletal: No significant bony findings. IMPRESSION: 1. Stable 16.5 x 13.5 mm left lower lobe pulmonary nodule adjacent to the descending thoracic aorta. 2. Multiple other smaller pulmonary nodules are unchanged  also. 3. No mediastinal or hilar mass or adenopathy. 4. Age advanced atherosclerotic calcifications involving the thoracic and abdominal aorta and branch vessels including the coronary arteries. 5. Stable surgical changes from a left nephrectomy. 6. Aortic atherosclerosis. Aortic Atherosclerosis (ICD10-I70.0). Electronically Signed   By: Marijo Sanes M.D.   On: 10/17/2021 10:25   DG C-ARM BRONCHOSCOPY  Result Date: 10/19/2021 C-ARM BRONCHOSCOPY: Fluoroscopy was utilized by the requesting physician.  No radiographic interpretation.       IMPRESSION/PLAN: 1. Stage IA2, cT1bN0M0, NSCLC, adenocarcinoma of the LLL. Dr. Lisbeth Renshaw discusses the pathology  findings and reviews the nature of early stage lung disease. Dr. Lisbeth Renshaw reviews that the standard of care is for surgical resection. However for patients who are not medical candidates to undergo surgery, or who choose to forgo surgery, stereotactic body radiotherapy (SBRT) is an appropriate alternative. In this pt's case, the terminology of what we would call his treatment is actually ultrahypofractionated radiotherapy due to the proximity of his nodule to the thoracic aorta. As such, Dr. Lisbeth Renshaw would offer 8-10 fractions, anticipating 8 treatments to shape radiotherapy around the structure of his vessels. We discussed the risks, benefits, short, and long term effects of radiotherapy, as well as the curative intent, and the patient is interested in proceeding. Dr. Lisbeth Renshaw discusses the delivery and logistics of radiotherapy. Written consent is obtained and placed in the chart, a copy was provided to the patient. The patient will be contacted to coordinate treatment planning by our simulation department. He is scheduled to meet with medical oncology next week and they will follow him long term after radiation in surveillance. 2. History of Left Renal Cell Carcinoma. He has been NED since surgery and will continue in surveillance with Dr. Tresa Moore at [facility].  In  a visit lasting 60 minutes, greater than 50% of the time was spent face to face discussing the patient's condition, in preparation for the discussion, and coordinating the patient's care.   The above documentation reflects my direct findings during this shared patient visit. Please see the separate note by Dr. Lisbeth Renshaw on this date for the remainder of the patient's plan of care.    Carola Rhine, [facility]   **Disclaimer: This note was dictated with voice recognition software. Similar sounding words can inadvertently be transcribed and this note may contain transcription errors which may not have been corrected upon publication of note.**

## 2021-11-02 ENCOUNTER — Ambulatory Visit
Admission: RE | Admit: 2021-11-02 | Discharge: 2021-11-02 | Disposition: A | Discharge status: Home / Self Care | Payer: PPO | Source: Ambulatory Visit | Attending: Radiation Oncology | Admitting: Radiation Oncology

## 2021-11-02 ENCOUNTER — Encounter: Payer: Self-pay | Admitting: *Deleted

## 2021-11-02 ENCOUNTER — Encounter: Payer: Self-pay | Admitting: Radiation Oncology

## 2021-11-02 ENCOUNTER — Other Ambulatory Visit: Payer: Self-pay

## 2021-11-02 VITALS — BP 121/77 | HR 70 | Temp 96.9°F | Resp 18 | Ht 71.0 in | Wt 168.5 lb

## 2021-11-02 DIAGNOSIS — Z85528 Personal history of other malignant neoplasm of kidney: Secondary | ICD-10-CM | POA: Diagnosis not present

## 2021-11-02 DIAGNOSIS — Z7982 Long term (current) use of aspirin: Secondary | ICD-10-CM | POA: Diagnosis not present

## 2021-11-02 DIAGNOSIS — C3432 Malignant neoplasm of lower lobe, left bronchus or lung: Secondary | ICD-10-CM

## 2021-11-02 DIAGNOSIS — Z905 Acquired absence of kidney: Secondary | ICD-10-CM | POA: Diagnosis not present

## 2021-11-02 DIAGNOSIS — F17210 Nicotine dependence, cigarettes, uncomplicated: Secondary | ICD-10-CM | POA: Diagnosis not present

## 2021-11-02 DIAGNOSIS — Z8673 Personal history of transient ischemic attack (TIA), and cerebral infarction without residual deficits: Secondary | ICD-10-CM | POA: Diagnosis not present

## 2021-11-02 DIAGNOSIS — M129 Arthropathy, unspecified: Secondary | ICD-10-CM | POA: Diagnosis not present

## 2021-11-02 DIAGNOSIS — I700 Atherosclerosis of aorta: Secondary | ICD-10-CM | POA: Insufficient documentation

## 2021-11-02 DIAGNOSIS — N189 Chronic kidney disease, unspecified: Secondary | ICD-10-CM | POA: Diagnosis not present

## 2021-11-02 DIAGNOSIS — J984 Other disorders of lung: Secondary | ICD-10-CM | POA: Diagnosis not present

## 2021-11-02 DIAGNOSIS — I129 Hypertensive chronic kidney disease with stage 1 through stage 4 chronic kidney disease, or unspecified chronic kidney disease: Secondary | ICD-10-CM | POA: Diagnosis not present

## 2021-11-02 NOTE — Progress Notes (Signed)
Oncology Nurse Navigator Documentation ? ?Oncology Nurse Navigator Flowsheets 11/02/2021 10/26/2021  ?Navigator Follow Up Date: - 11/09/2021  ?Navigator Follow Up Reason: - New Patient Appointment  ?Navigator Location - [facility]  ?Referral Date to RadOnc/MedOnc - 10/26/2021  ?Navigator Encounter Type Pathology Review Other:  ?Patient Visit Type Other Other  ?Treatment Phase Pre-Tx/Tx Discussion Pre-Tx/Tx Discussion  ?Barriers/Navigation Needs Coordination of Care/I contacted Dr. Valeta Harms regarding Chris Holloway to see if molecular and PDL 1 testing have been completed. There was no testing done. I notified Dr. Julien Nordmann and he wants this test completed. I notified pathology dept to send recent bx for these tests. I notified pathology to send.  Coordination of Care  ?Interventions Coordination of Care Coordination of Care  ?Acuity Level 2-Minimal Needs (1-2 Barriers Identified) Level 2-Minimal Needs (1-2 Barriers Identified)  ?Coordination of Care Pathology Other  ?Time Spent with Patient 30 30  ?  ?

## 2021-11-04 NOTE — Progress Notes (Unsigned)
[facility] Telephone:([phone]) [phone]   Fax:([phone]) [phone]  CONSULT NOTE  REFERRING PHYSICIAN: Eric Form  REASON FOR CONSULTATION:  Non-small cell lung cancer, adenocarcinoma  HPI Chris Holloway is a 71 y.o. male with a past medical history significant for renal cell carcinoma status post left robotic nephrectomy in November 2021, tobacco abuse, and ***is referred to the clinic for newly diagnosed non-small cell lung cancer, adenocarcinoma.  The patient's recent evaluation began when the patient had a restaging CT scan in December 2021 to monitor/surveillance for his history of renal cell carcinoma.  He is followed by Dr. Tresa Holloway from urology. The scan noted a persistent nodule in the left lower lobe measuring 1.9 cm.  The patient was subsequently referred to pulmonary medicine.  He had a PET scan performed on 09/17/2021 that showed a 2 cm left lower lobe nodule with an SUV max of 7.  There was also numerous other small nodules which were suboptimally evaluated due to the size, however they were unchanged from prior scans without any hypermetabolism.   Dr. Valeta Harms subsequently arrange for a bronchoscopy on 10/19/2021.  The pathology [phone]) showed malignant cells consistent with adenocarcinoma.  The patient is not interested in any chemotherapy or surgical resection.  The patient is interested in radiation treatment.  He was seen by Dr. Lisbeth Renshaw who is currently undergoing treatment with ultra hypofractionated course.  The patient's last day radiation is scheduled for 10/29/2021.  Overall, the patient is ***today.  He denies any fever, chills, night sweats, or unexplained weight loss.  HPI  Past Medical History:  Diagnosis Date   Arthritis    hands back,    Chronic kidney disease    Per Dr. Kenton Kingfisher but not with Dr. Tresa Holloway   Hypertension    Stroke [facility]) 1998   mild stroke like symptoms lasted 5 min.    Past Surgical History:  Procedure Laterality Date   BRONCHIAL  BIOPSY  10/19/2021   Procedure: BRONCHIAL BIOPSIES;  Surgeon: Garner Nash, DO;  Location: [facility];  Service: Pulmonary;;   BRONCHIAL NEEDLE ASPIRATION BIOPSY  10/19/2021   Procedure: BRONCHIAL NEEDLE ASPIRATION BIOPSIES;  Surgeon: Garner Nash, DO;  Location: [facility];  Service: Pulmonary;;   FIDUCIAL MARKER PLACEMENT  10/19/2021   Procedure: FIDUCIAL MARKER PLACEMENT;  Surgeon: Garner Nash, DO;  Location: [facility];  Service: Pulmonary;;   HAND LIGAMENT RECONSTRUCTION Right 1990s   Saw injury   ROBOTIC ASSITED PARTIAL NEPHRECTOMY Left 07/15/2020   Procedure: XI ROBOTIC ASSITED RADICAL NEPHRECTOMY WITH INTRAOPERAATIVE ULTRASOUND;  Surgeon: Alexis Frock, MD;  Location: [facility];  Service: Urology;  Laterality: Left;  3 HRS   SHOULDER ARTHROSCOPY W/ ROTATOR CUFF REPAIR Left 08/2018   VIDEO BRONCHOSCOPY WITH RADIAL ENDOBRONCHIAL ULTRASOUND  10/19/2021   Procedure: RADIAL ENDOBRONCHIAL ULTRASOUND;  Surgeon: Garner Nash, DO;  Location: [facility];  Service: Pulmonary;;    No family history on file.  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     ALPRAZolam (XANAX) 1 MG tablet Take 1.5 mg by mouth at bedtime.     aspirin EC 81 MG tablet Take 81 mg by mouth at bedtime. Swallow whole.     cetirizine (ZYRTEC) 10 MG tablet Take 10 mg by mouth daily.     chlorthalidone (HYGROTON) 25 MG tablet Take 25 mg by mouth at bedtime.     cholecalciferol  (VITAMIN D3) 25 MCG (1000 UNIT) tablet Take 1,000 Units by mouth daily.     CRANBERRY PO Take 2 tablets by mouth daily.     gabapentin (NEURONTIN) 100 MG capsule Take 100 mg by mouth 2 (two) times daily as needed (pain).     guaifenesin (HUMIBID E) 400 MG TABS tablet Take 400 mg by mouth in the morning and at bedtime.     omega-3 acid ethyl esters (LOVAZA) 1 g capsule Take 3,000 mg by mouth at bedtime.     Polyvinyl Alcohol-Povidone (CLEAR EYES ALL SEASONS) 5-6 MG/ML SOLN Place 1 drop into both eyes daily as needed (Dry eyes).     pravastatin (PRAVACHOL) 40 MG tablet Take 40 mg by mouth at bedtime.     sildenafil (VIAGRA) 100 MG tablet Take 100 mg by mouth daily as needed for erectile dysfunction.     triamcinolone (NASACORT) 55 MCG/ACT AERO nasal inhaler Place 2 sprays into the nose daily as needed (allergies).     Turmeric 500 MG CAPS Take 500 mg by mouth in the morning and at bedtime.     zinc gluconate 50 MG tablet Take 50 mg by mouth at bedtime.     No current facility-administered medications for this visit.    REVIEW OF SYSTEMS:   Review of Systems  Constitutional: Negative for appetite change, chills, fatigue, fever and unexpected weight change.  HENT:   Negative for mouth sores, nosebleeds, sore throat and trouble swallowing.   Eyes: Negative for eye problems and icterus.  Respiratory: Negative for cough, hemoptysis, shortness of breath and wheezing.   Cardiovascular: Negative for chest pain and leg swelling.  Gastrointestinal: Negative for abdominal pain, constipation, diarrhea, nausea and vomiting.  Genitourinary: Negative for bladder incontinence, difficulty urinating, dysuria, frequency and hematuria.   Musculoskeletal: Negative for back pain, gait problem, neck pain and neck stiffness.  Skin: Negative for itching and rash.  Neurological: Negative for dizziness, extremity weakness, gait problem, headaches, light-headedness and seizures.  Hematological: Negative for adenopathy.  Does not bruise/bleed easily.  Psychiatric/Behavioral: Negative for confusion, depression and sleep disturbance. The patient is not nervous/anxious.     PHYSICAL EXAMINATION:  There were no vitals taken for this visit.  ECOG PERFORMANCE STATUS: {CHL ONC ECOG [id number]  Physical Exam  Constitutional: Oriented to person, place, and time and well-developed, well-nourished, and in no distress. No distress.  HENT:  Head: Normocephalic and atraumatic.  Mouth/Throat: Oropharynx is clear and moist. No oropharyngeal exudate.  Eyes: Conjunctivae are normal. Right eye exhibits no discharge. Left eye exhibits no discharge. No scleral icterus.  Neck: Normal range of motion. Neck supple.  Cardiovascular: Normal rate, regular rhythm, normal heart sounds and intact distal pulses.   Pulmonary/Chest: Effort normal and breath sounds normal. No respiratory distress. No wheezes. No rales.  Abdominal: Soft. Bowel sounds are normal. Exhibits no distension and no mass. There is no tenderness.  Musculoskeletal: Normal range of motion. Exhibits no edema.  Lymphadenopathy:  No cervical adenopathy.  Neurological: Alert and oriented to person, place, and time. Exhibits normal muscle tone. Gait normal. Coordination normal.  Skin: Skin is warm and dry. No rash noted. Not diaphoretic. No erythema. No pallor.  Psychiatric: Mood, memory and judgment normal.  Vitals reviewed.  LABORATORY DATA: Lab Results  Component Value Date   WBC 11.4 (H) 07/09/2020   HGB 12.1 (L) 07/16/2020   HCT 34.4 (L) 07/16/2020   MCV 101.2 (H) 07/09/2020   PLT 305 07/09/2020      Chemistry      Component Value Date/Time   NA 133 (L) 10/19/2021 0630   K 3.3 (L) 10/19/2021 0630   CL 96 (L) 10/19/2021 0630   CO2 26 10/19/2021 0630   BUN 28 (H) 10/19/2021 0630   CREATININE 1.78 (H) 10/19/2021 0630      Component Value Date/Time   CALCIUM 9.4 10/19/2021 0630       RADIOGRAPHIC STUDIES: DG Chest Port 1 View  Result  Date: 10/19/2021 CLINICAL DATA:  S/P bronchoscopy with biopsy left lung EXAM: PORTABLE CHEST - 1 VIEW COMPARISON:  08/08/2021 FINDINGS: No pneumothorax. Coarse somewhat attenuated pulmonary bronchovascular markings. Left infrahilar metallic marker. No confluent infiltrate or overt edema. Heart size and mediastinal contours are within normal limits. Aortic Atherosclerosis (ICD10-170.0). No effusion. Mild thoracic levoscoliosis. IMPRESSION: No pneumothorax post bronchoscopy Electronically Signed   By: Lucrezia Europe M.D.   On: 10/19/2021 09:16   CT Super D Chest Wo Contrast  Result Date: 10/17/2021 CLINICAL DATA:  Super D protocol for EMB. Left lower lobe pulmonary nodules. EXAM: CT CHEST WITHOUT CONTRAST TECHNIQUE: Multidetector CT imaging of the chest was performed using thin slice collimation for electromagnetic bronchoscopy planning purposes, without intravenous contrast. RADIATION DOSE REDUCTION: This exam was performed according to the departmental dose-optimization program which includes automated exposure control, adjustment of the mA and/or kV according to patient size and/or use of iterative reconstruction technique. COMPARISON:  Chest CT 08/09/2021 and PET-CT 09/21/2021 FINDINGS: Cardiovascular: The heart is normal in size. No pericardial effusion. There is mild tortuosity and moderate age advanced atherosclerotic calcifications involving the thoracic aorta. Age advanced three-vessel coronary artery calcifications again noted. Calcifications also noted in the region of the aortic valve. Mediastinum/Nodes: No mediastinal or hilar mass or lymphadenopathy. The esophagus is grossly normal. Lungs/Pleura: The left lower lobe pulmonary lesion adjacent to the descending thoracic aorta is stable measuring approximately the 16.5 x 13.5 mm. This was hypermetabolic on the PET-CT. There also several other smaller pulmonary nodules in the left lower lobe along with small nodules in the right middle lobe and scattered  subpleural nodules in both lungs. No acute overlying pulmonary process. No pleural effusions. Upper Abdomen: Stable surgical changes from a left nephrectomy. Stable calcified granuloma the liver. Age advanced vascular calcifications are stable. Musculoskeletal: No significant bony findings. IMPRESSION: 1. Stable 16.5 x 13.5 mm left lower lobe pulmonary nodule adjacent to the descending thoracic aorta. 2. Multiple other smaller pulmonary nodules are unchanged also. 3. No mediastinal or hilar mass or adenopathy. 4. Age advanced atherosclerotic calcifications involving the thoracic and abdominal aorta and branch vessels including the coronary arteries. 5. Stable surgical changes from a left nephrectomy. 6. Aortic atherosclerosis. Aortic Atherosclerosis (ICD10-I70.0). Electronically Signed   By: Marijo Sanes M.D.   On: 10/17/2021 10:25   DG C-ARM BRONCHOSCOPY  Result Date: 10/19/2021 C-ARM BRONCHOSCOPY: Fluoroscopy was utilized by the requesting physician.  No radiographic interpretation.    ASSESSMENT: This is a very pleasant 71 year old Caucasian male diagnosed  with stage I (T***, N0, M0) non-small cell lung cancer, adenocarcinoma.  The patient presented with a left lower lobe lung nodule.  She was diagnosed in February 2023  The patient is currently undergoing SBRT.  Her last day of radiation is scheduled for 11/25/2021.  She is followed by Dr. Lisbeth Renshaw.  The patient was seen with Dr. Julien Nordmann today.  Dr. Julien Nordmann had a lengthy discussion with the patient today about his current condition.  Dr. Julien Nordmann discussed that there is no indication to undergo chemotherapy at this time for stage I lung cancer.  Dr. Julien Nordmann recommends that the patient continue on observation with a repeat CT scan of the chest in 6 to 4 months.  I have placed the order for the CT scan.  We will see the patient back for follow-up visit a few days after his scan for evaluation to review the results.  Smoking cessation?  The patient  was advised to call immediately if he has any concerning symptoms in the interval. The patient voices understanding of current disease status and treatment options and is in agreement with the current care plan. All questions were answered. The patient knows to call the clinic with any problems, questions or concerns. We can certainly see the patient much sooner if necessary    The patient voices understanding of current disease status and treatment options and is in agreement with the current care plan.  All questions were answered. The patient knows to call the clinic with any problems, questions or concerns. We can certainly see the patient much sooner if necessary.  Thank you so much for allowing me to participate in the care of Chris Holloway. I will continue to follow up the patient with you and assist in his care.  I spent {CHL ONC TIME VISIT - XFGHW:[id number]} counseling the patient face to face. The total time spent in the appointment was {CHL ONC TIME VISIT - ELFYB:[id number]}.  Disclaimer: This note was dictated with voice recognition software. Similar sounding words can inadvertently be transcribed and may not be corrected upon review.   Jahir Halt L Heylee Tant November 04, 2021, 1:33 PM

## 2021-11-05 ENCOUNTER — Ambulatory Visit: Admission: RE | Admit: 2021-11-05 | Payer: PPO | Source: Ambulatory Visit | Admitting: Radiation Oncology

## 2021-11-05 ENCOUNTER — Other Ambulatory Visit: Payer: Self-pay

## 2021-11-05 DIAGNOSIS — Z510 Encounter for antineoplastic radiation therapy: Secondary | ICD-10-CM | POA: Insufficient documentation

## 2021-11-05 DIAGNOSIS — C3432 Malignant neoplasm of lower lobe, left bronchus or lung: Secondary | ICD-10-CM | POA: Diagnosis not present

## 2021-11-09 ENCOUNTER — Inpatient Hospital Stay: Payer: PPO | Attending: Physician Assistant | Admitting: Physician Assistant

## 2021-11-09 ENCOUNTER — Other Ambulatory Visit: Payer: Self-pay

## 2021-11-09 ENCOUNTER — Telehealth: Payer: Self-pay

## 2021-11-09 VITALS — BP 121/72 | HR 79 | Temp 97.3°F | Resp 18 | Ht 71.0 in | Wt 169.5 lb

## 2021-11-09 DIAGNOSIS — I129 Hypertensive chronic kidney disease with stage 1 through stage 4 chronic kidney disease, or unspecified chronic kidney disease: Secondary | ICD-10-CM | POA: Diagnosis not present

## 2021-11-09 DIAGNOSIS — N189 Chronic kidney disease, unspecified: Secondary | ICD-10-CM | POA: Diagnosis not present

## 2021-11-09 DIAGNOSIS — F17210 Nicotine dependence, cigarettes, uncomplicated: Secondary | ICD-10-CM | POA: Diagnosis not present

## 2021-11-09 DIAGNOSIS — C3432 Malignant neoplasm of lower lobe, left bronchus or lung: Secondary | ICD-10-CM

## 2021-11-09 DIAGNOSIS — C3412 Malignant neoplasm of upper lobe, left bronchus or lung: Secondary | ICD-10-CM

## 2021-11-09 NOTE — Telephone Encounter (Signed)
I called pt to determine if he was coming to his 8:30am appt because he is late. Pt states he is on his way and is about 75min away. Pt was advised his appt was at 8:30am and we ask that he arrive 79min early for his appts to allow opportunity for him to get through registration. Pt expressed understanding of this information. ?

## 2021-11-10 ENCOUNTER — Encounter: Payer: Self-pay | Admitting: *Deleted

## 2021-11-10 NOTE — Progress Notes (Signed)
Oncology Nurse Navigator Documentation ? ?Oncology Nurse Navigator Flowsheets 11/10/2021 11/02/2021 10/26/2021  ?Navigator Follow Up Date: 11/19/2021 - 11/09/2021  ?Navigator Follow Up Reason: Pathology - New Patient Appointment  ?Navigator Location [facility]  ?Referral Date to RadOnc/MedOnc - - 10/26/2021  ?Navigator Encounter Type Pathology Review/I followed up with [facility] on Mr. Kestler molecular results.  Test is pending with estimated completion on 3/24.   Pathology Review Other:  ?Patient Visit Type Other Other Other  ?Treatment Phase Treatment Pre-Tx/Tx Discussion Pre-Tx/Tx Discussion  ?Barriers/Navigation Needs Coordination of Care Coordination of Care Coordination of Care  ?Interventions Coordination of Care Coordination of Care Coordination of Care  ?Acuity Level 2-Minimal Needs (1-2 Barriers Identified) Level 2-Minimal Needs (1-2 Barriers Identified) Level 2-Minimal Needs (1-2 Barriers Identified)  ?Coordination of Care Pathology Pathology Other  ?Time Spent with Patient 30 30 30   ?  ?

## 2021-11-11 ENCOUNTER — Encounter (HOSPITAL_COMMUNITY): Payer: Self-pay | Admitting: Internal Medicine

## 2021-11-11 ENCOUNTER — Encounter: Payer: Self-pay | Admitting: *Deleted

## 2021-11-11 NOTE — Progress Notes (Signed)
Oncology Nurse Navigator Documentation ? ?Oncology Nurse Navigator Flowsheets 11/11/2021 11/10/2021 11/02/2021 10/26/2021  ?Navigator Follow Up Date: - 11/19/2021 - 11/09/2021  ?Navigator Follow Up Reason: - Pathology - New Patient Appointment  ?Navigator Location [facility]  ?Referral Date to RadOnc/MedOnc - - - 10/26/2021  ?Navigator Encounter Type Pathology Review/PDL 1 test results are completed. Will print and place on Dr. Worthy Flank desk.  Pathology Review Pathology Review Other:  ?Patient Visit Type - Other Other Other  ?Treatment Phase - Treatment Pre-Tx/Tx Discussion Pre-Tx/Tx Discussion  ?Barriers/Navigation Needs Coordination of Care Coordination of Care Coordination of Care Coordination of Care  ?Interventions Coordination of Care Coordination of Care Coordination of Care Coordination of Care  ?Acuity Level 2-Minimal Needs (1-2 Barriers Identified) Level 2-Minimal Needs (1-2 Barriers Identified) Level 2-Minimal Needs (1-2 Barriers Identified) Level 2-Minimal Needs (1-2 Barriers Identified)  ?Coordination of Care Pathology Pathology Pathology Other  ?Time Spent with Patient 15 30 30 30   ?  ?

## 2021-11-15 ENCOUNTER — Encounter (HOSPITAL_COMMUNITY): Payer: Self-pay | Admitting: Internal Medicine

## 2021-11-15 DIAGNOSIS — C3491 Malignant neoplasm of unspecified part of right bronchus or lung: Secondary | ICD-10-CM | POA: Diagnosis not present

## 2021-11-15 DIAGNOSIS — Z510 Encounter for antineoplastic radiation therapy: Secondary | ICD-10-CM | POA: Diagnosis not present

## 2021-11-15 DIAGNOSIS — C3432 Malignant neoplasm of lower lobe, left bronchus or lung: Secondary | ICD-10-CM | POA: Diagnosis not present

## 2021-11-16 ENCOUNTER — Other Ambulatory Visit: Payer: Self-pay

## 2021-11-16 ENCOUNTER — Ambulatory Visit
Admission: RE | Admit: 2021-11-16 | Discharge: 2021-11-16 | Disposition: A | Discharge status: Home / Self Care | Payer: PPO | Source: Ambulatory Visit | Attending: Radiation Oncology | Admitting: Radiation Oncology

## 2021-11-16 DIAGNOSIS — Z510 Encounter for antineoplastic radiation therapy: Secondary | ICD-10-CM | POA: Diagnosis not present

## 2021-11-16 DIAGNOSIS — C3432 Malignant neoplasm of lower lobe, left bronchus or lung: Secondary | ICD-10-CM | POA: Diagnosis not present

## 2021-11-17 ENCOUNTER — Ambulatory Visit
Admission: RE | Admit: 2021-11-17 | Discharge: 2021-11-17 | Disposition: A | Discharge status: Home / Self Care | Payer: PPO | Source: Ambulatory Visit | Attending: Radiation Oncology | Admitting: Radiation Oncology

## 2021-11-17 DIAGNOSIS — C3432 Malignant neoplasm of lower lobe, left bronchus or lung: Secondary | ICD-10-CM | POA: Diagnosis not present

## 2021-11-17 DIAGNOSIS — Z510 Encounter for antineoplastic radiation therapy: Secondary | ICD-10-CM | POA: Diagnosis not present

## 2021-11-18 ENCOUNTER — Ambulatory Visit
Admission: RE | Admit: 2021-11-18 | Discharge: 2021-11-18 | Disposition: A | Discharge status: Home / Self Care | Payer: PPO | Source: Ambulatory Visit | Attending: Radiation Oncology | Admitting: Radiation Oncology

## 2021-11-18 DIAGNOSIS — C3432 Malignant neoplasm of lower lobe, left bronchus or lung: Secondary | ICD-10-CM | POA: Diagnosis not present

## 2021-11-18 DIAGNOSIS — Z510 Encounter for antineoplastic radiation therapy: Secondary | ICD-10-CM | POA: Diagnosis not present

## 2021-11-19 ENCOUNTER — Other Ambulatory Visit: Payer: Self-pay

## 2021-11-19 ENCOUNTER — Ambulatory Visit
Admission: RE | Admit: 2021-11-19 | Discharge: 2021-11-19 | Disposition: A | Discharge status: Home / Self Care | Payer: PPO | Source: Ambulatory Visit | Attending: Radiation Oncology | Admitting: Radiation Oncology

## 2021-11-19 DIAGNOSIS — C3432 Malignant neoplasm of lower lobe, left bronchus or lung: Secondary | ICD-10-CM | POA: Diagnosis not present

## 2021-11-19 DIAGNOSIS — Z510 Encounter for antineoplastic radiation therapy: Secondary | ICD-10-CM | POA: Diagnosis not present

## 2021-11-22 ENCOUNTER — Ambulatory Visit
Admission: RE | Admit: 2021-11-22 | Discharge: 2021-11-22 | Disposition: A | Discharge status: Home / Self Care | Payer: PPO | Source: Ambulatory Visit | Attending: Radiation Oncology | Admitting: Radiation Oncology

## 2021-11-22 DIAGNOSIS — Z510 Encounter for antineoplastic radiation therapy: Secondary | ICD-10-CM | POA: Diagnosis not present

## 2021-11-22 DIAGNOSIS — C3432 Malignant neoplasm of lower lobe, left bronchus or lung: Secondary | ICD-10-CM | POA: Diagnosis not present

## 2021-11-23 ENCOUNTER — Ambulatory Visit
Admission: RE | Admit: 2021-11-23 | Discharge: 2021-11-23 | Disposition: A | Discharge status: Home / Self Care | Payer: PPO | Source: Ambulatory Visit | Attending: Radiation Oncology | Admitting: Radiation Oncology

## 2021-11-23 ENCOUNTER — Other Ambulatory Visit: Payer: Self-pay

## 2021-11-23 DIAGNOSIS — C3432 Malignant neoplasm of lower lobe, left bronchus or lung: Secondary | ICD-10-CM | POA: Diagnosis not present

## 2021-11-23 DIAGNOSIS — Z510 Encounter for antineoplastic radiation therapy: Secondary | ICD-10-CM | POA: Diagnosis not present

## 2021-11-24 ENCOUNTER — Ambulatory Visit
Admission: RE | Admit: 2021-11-24 | Discharge: 2021-11-24 | Disposition: A | Discharge status: Home / Self Care | Payer: PPO | Source: Ambulatory Visit | Attending: Radiation Oncology | Admitting: Radiation Oncology

## 2021-11-24 DIAGNOSIS — C3432 Malignant neoplasm of lower lobe, left bronchus or lung: Secondary | ICD-10-CM | POA: Diagnosis not present

## 2021-11-24 DIAGNOSIS — Z510 Encounter for antineoplastic radiation therapy: Secondary | ICD-10-CM | POA: Diagnosis not present

## 2021-11-25 ENCOUNTER — Other Ambulatory Visit: Payer: Self-pay

## 2021-11-25 ENCOUNTER — Encounter: Payer: Self-pay | Admitting: Radiation Oncology

## 2021-11-25 ENCOUNTER — Ambulatory Visit
Admission: RE | Admit: 2021-11-25 | Discharge: 2021-11-25 | Disposition: A | Discharge status: Home / Self Care | Payer: PPO | Source: Ambulatory Visit | Attending: Radiation Oncology | Admitting: Radiation Oncology

## 2021-11-25 DIAGNOSIS — Z510 Encounter for antineoplastic radiation therapy: Secondary | ICD-10-CM | POA: Diagnosis not present

## 2021-11-25 DIAGNOSIS — C3432 Malignant neoplasm of lower lobe, left bronchus or lung: Secondary | ICD-10-CM | POA: Diagnosis not present

## 2021-12-03 ENCOUNTER — Encounter: Payer: Self-pay | Admitting: *Deleted

## 2021-12-03 NOTE — Progress Notes (Signed)
Oncology Nurse Navigator Documentation ? ? ?  12/03/2021  ? 10:00 AM 11/11/2021  ?  3:00 PM 11/10/2021  ?  2:00 PM 11/02/2021  ?  9:00 AM 10/26/2021  ? 10:00 AM  ?Oncology Nurse Navigator Flowsheets  ?Abnormal Finding Date 09/17/2021      ?Confirmed Diagnosis Date 10/19/2021      ?Diagnosis Status Confirmed Diagnosis Complete      ?Planned Course of Treatment Radiation      ?Phase of Treatment Radiation      ?Radiation Actual Start Date: 11/16/2021      ?Navigator Follow Up Date: 03/09/2022  11/19/2021  11/09/2021  ?Navigator Follow Up Reason: Follow-up Appointment  Pathology  New Patient Appointment  ?Navigator Location [facility]  ?Referral Date to RadOnc/MedOnc     10/26/2021  ?Navigator Encounter Type Appt/Treatment Plan Review;Molecular Studies Pathology Review Pathology Review Pathology Review Other:  ?Patient Visit Type   Other Other Other  ?Treatment Phase   Treatment Pre-Tx/Tx Discussion Pre-Tx/Tx Discussion  ?Barriers/Navigation Needs Coordination of Care/I followed up on patients plan of care. He is getting his radiation and has follow up with Dr. Julien Nordmann in July.  Treatment plan is set up at this time.  Coordination of Care Coordination of Care Coordination of Care Coordination of Care  ?Interventions Coordination of Care Coordination of Care Coordination of Care Coordination of Care Coordination of Care  ?Acuity Level 2-Minimal Needs (1-2 Barriers Identified) Level 2-Minimal Needs (1-2 Barriers Identified) Level 2-Minimal Needs (1-2 Barriers Identified) Level 2-Minimal Needs (1-2 Barriers Identified) Level 2-Minimal Needs (1-2 Barriers Identified)  ?Coordination of Care Pathology;Other Pathology Pathology Pathology Other  ?Time Spent with Patient 30 15 30 30 30   ?  ?

## 2021-12-07 NOTE — Progress Notes (Signed)
? ?                                                                                                                                                          ?  Patient Name: SALEEM COCCIA ?MRN: [id number] ?DOB: 01-07-1951 ?Referring Physician: June Leap ?Date of Service: 11/25/2021 ?[facility] [place] ? ?                                                      End Of Treatment Note ? ?Diagnoses: C34.32-Malignant neoplasm of lower lobe, left bronchus or lung ? ?Cancer Staging:  Stage IA2, cT1bN0M0, NSCLC, adenocarcinoma of the LLL ? ?Intent: Curative ? ?Radiation Treatment Dates:  ?11/16/2021 through 11/25/2021 ?Ultrahypofractionated Radiation ?Site Technique Total Dose (Gy) Dose per Fx (Gy) Completed Fx Beam Energies  ?Lung, Left: Lung_L IMRT 60/60 7.5 8/8 6XFFF  ? ?Narrative: The patient tolerated radiation therapy relatively well.  ? ?Plan: The patient will receive a call in about one month from the radiation oncology department. He will continue follow up with Dr. Julien Nordmann as well.  ? ?________________________________________________ ? ? ? ?Carola Rhine, PAC  ?

## 2021-12-10 ENCOUNTER — Encounter: Payer: Self-pay | Admitting: *Deleted

## 2021-12-10 NOTE — Progress Notes (Signed)
Oncology Nurse Navigator Documentation ? ? ?  12/10/2021  ?  2:00 PM 12/03/2021  ? 10:00 AM 11/11/2021  ?  3:00 PM 11/10/2021  ?  2:00 PM 11/02/2021  ?  9:00 AM 10/26/2021  ? 10:00 AM  ?Oncology Nurse Navigator Flowsheets  ?Abnormal Finding Date  09/17/2021      ?Confirmed Diagnosis Date  10/19/2021      ?Diagnosis Status  Confirmed Diagnosis Complete      ?Planned Course of Treatment  Radiation      ?Phase of Treatment Radiation Radiation      ?Radiation Actual Start Date:  11/16/2021      ?Radiation Actual End Date: 11/25/2021       ?Navigator Follow Up Date:  03/09/2022  11/19/2021  11/09/2021  ?Navigator Follow Up Reason:  Follow-up Appointment  Pathology  New Patient Appointment  ?Navigator Location [facility]  ?Referral Date to RadOnc/MedOnc      10/26/2021  ?Navigator Encounter Type Appt/Treatment Plan Review Appt/Treatment Plan Review;Molecular Studies Pathology Review Pathology Review Pathology Review Other:  ?Patient Visit Type    Other Other Other  ?Treatment Phase Final Radiation Tx   Treatment Pre-Tx/Tx Discussion Pre-Tx/Tx Discussion  ?Barriers/Navigation Needs Coordination of Care Coordination of Care Coordination of Care Coordination of Care Coordination of Care Coordination of Care  ?Interventions Coordination of Care/Patient has completed tx plan and has follow up scheduled.  Coordination of Care Coordination of Care Coordination of Care Coordination of Care Coordination of Care  ?Acuity Level 2-Minimal Needs (1-2 Barriers Identified) Level 2-Minimal Needs (1-2 Barriers Identified) Level 2-Minimal Needs (1-2 Barriers Identified) Level 2-Minimal Needs (1-2 Barriers Identified) Level 2-Minimal Needs (1-2 Barriers Identified) Level 2-Minimal Needs (1-2 Barriers Identified)  ?Coordination of Care Other Pathology;Other Pathology Pathology Pathology Other  ?Time Spent with Patient 30 30 15 30 30 30   ?  ?

## 2021-12-26 NOTE — Progress Notes (Signed)
?  Radiation Oncology         [phone] ?________________________________ ? ?Name: Chris Holloway MRN: [id number]  ?Date of Service: 12/27/2021  DOB: March 24, 1951 ? ?Post Treatment  Note ? ?Diagnosis:   Stage IA2, cT1bN0M0, NSCLC, adenocarcinoma of the LLL ? ?Intent: Curative ? ?Radiation Treatment Dates:  ?11/16/2021 through 11/25/2021 ?Ultrahypofractionated Radiation ?Site Technique Total Dose (Gy) Dose per Fx (Gy) Completed Fx Beam Energies  ?Lung, Left: Lung_L IMRT 60/60 7.5 8/8 6XFFF  ? ?Narrative: The patient tolerated radiation therapy relatively well. He did have some time outside last weekend where he was in direct sunlight and reports no skin burns. He reports some shortness of breath if exerting himself but no coughing or breathing difficulties at rest. He is trying to quit smoking.  ? ?Physical Exam: ?Vitals: per nursing documentation but afebrile, stable vitals noted.  ?In general this is a well appearing caucasian male in no acute distress. He's alert and oriented x4 and appropriate throughout the examination. Cardiopulmonary assessment is negative for acute distress and he exhibits normal effort. Skin in the posterior trunk is intact without erythema or dermatitis.  ? ? ? ?Impression/Plan: ?1. Stage IA2, cT1bN0M0, NSCLC, adenocarcinoma of the LLL. The patient has been doing well since completion of radiotherapy. We discussed that we would be happy to continue to follow him as needed, but he will also continue to follow up with Dr. Julien Nordmann in medical oncology.  ?2. Tobacco cessation. We discussed typs of trying to cut back and he will contact us if he needs additional resources. ? ? ? ? ?Carola Rhine, PAC  ? ? ? ? ?

## 2021-12-27 ENCOUNTER — Ambulatory Visit
Admission: RE | Admit: 2021-12-27 | Discharge: 2021-12-27 | Disposition: A | Discharge status: Home / Self Care | Payer: PPO | Source: Ambulatory Visit | Attending: Radiation Oncology | Admitting: Radiation Oncology

## 2021-12-27 ENCOUNTER — Other Ambulatory Visit: Payer: Self-pay

## 2021-12-27 DIAGNOSIS — C3432 Malignant neoplasm of lower lobe, left bronchus or lung: Secondary | ICD-10-CM | POA: Insufficient documentation

## 2022-01-15 IMAGING — DX DG CHEST 2V
2 series · 2 of 2 positions shown · non-contrast
Comparison: None.

CLINICAL DATA: Preop for nephrectomy.  Smoker.

EXAM:
CHEST - 2 VIEW

[chest pa]
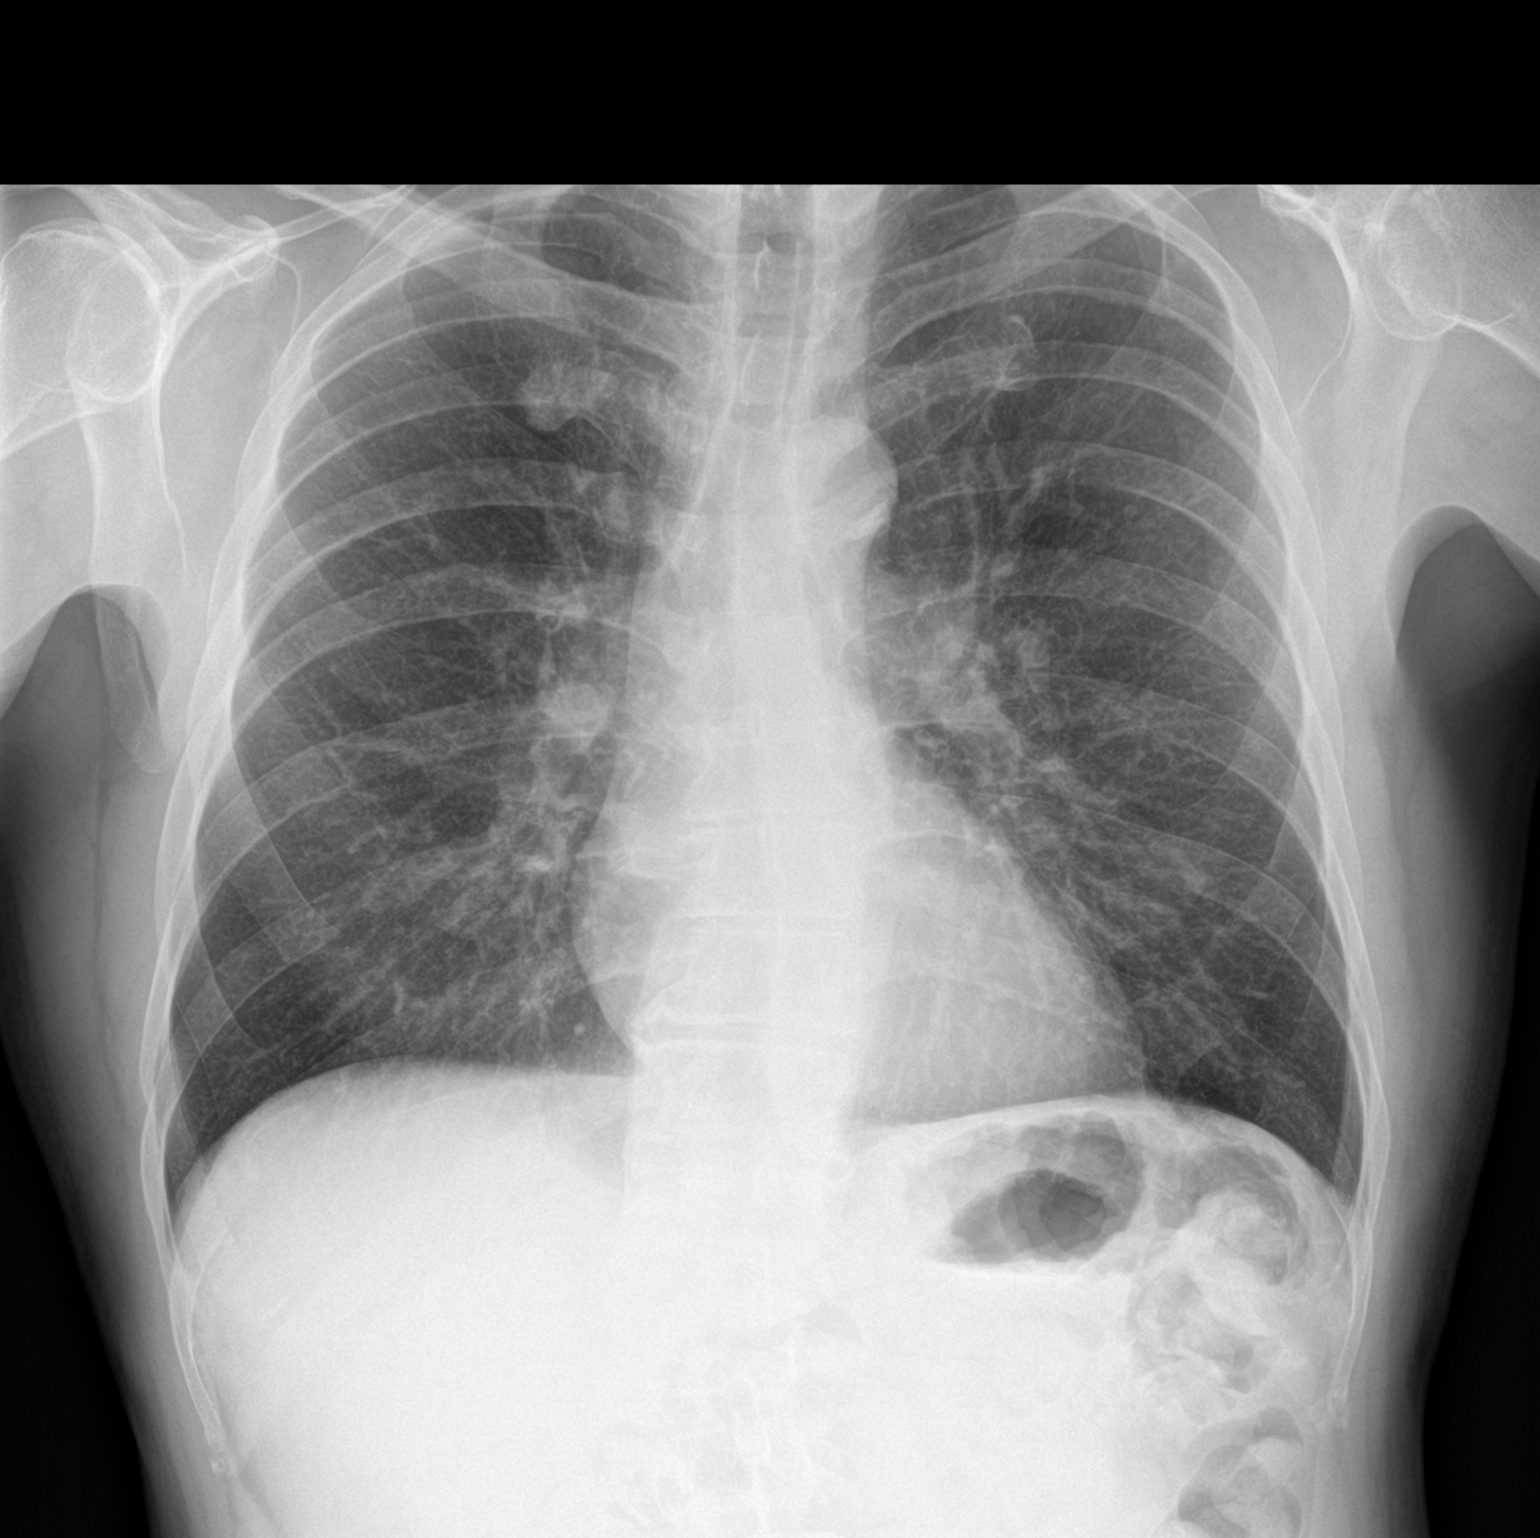

[chest lat]
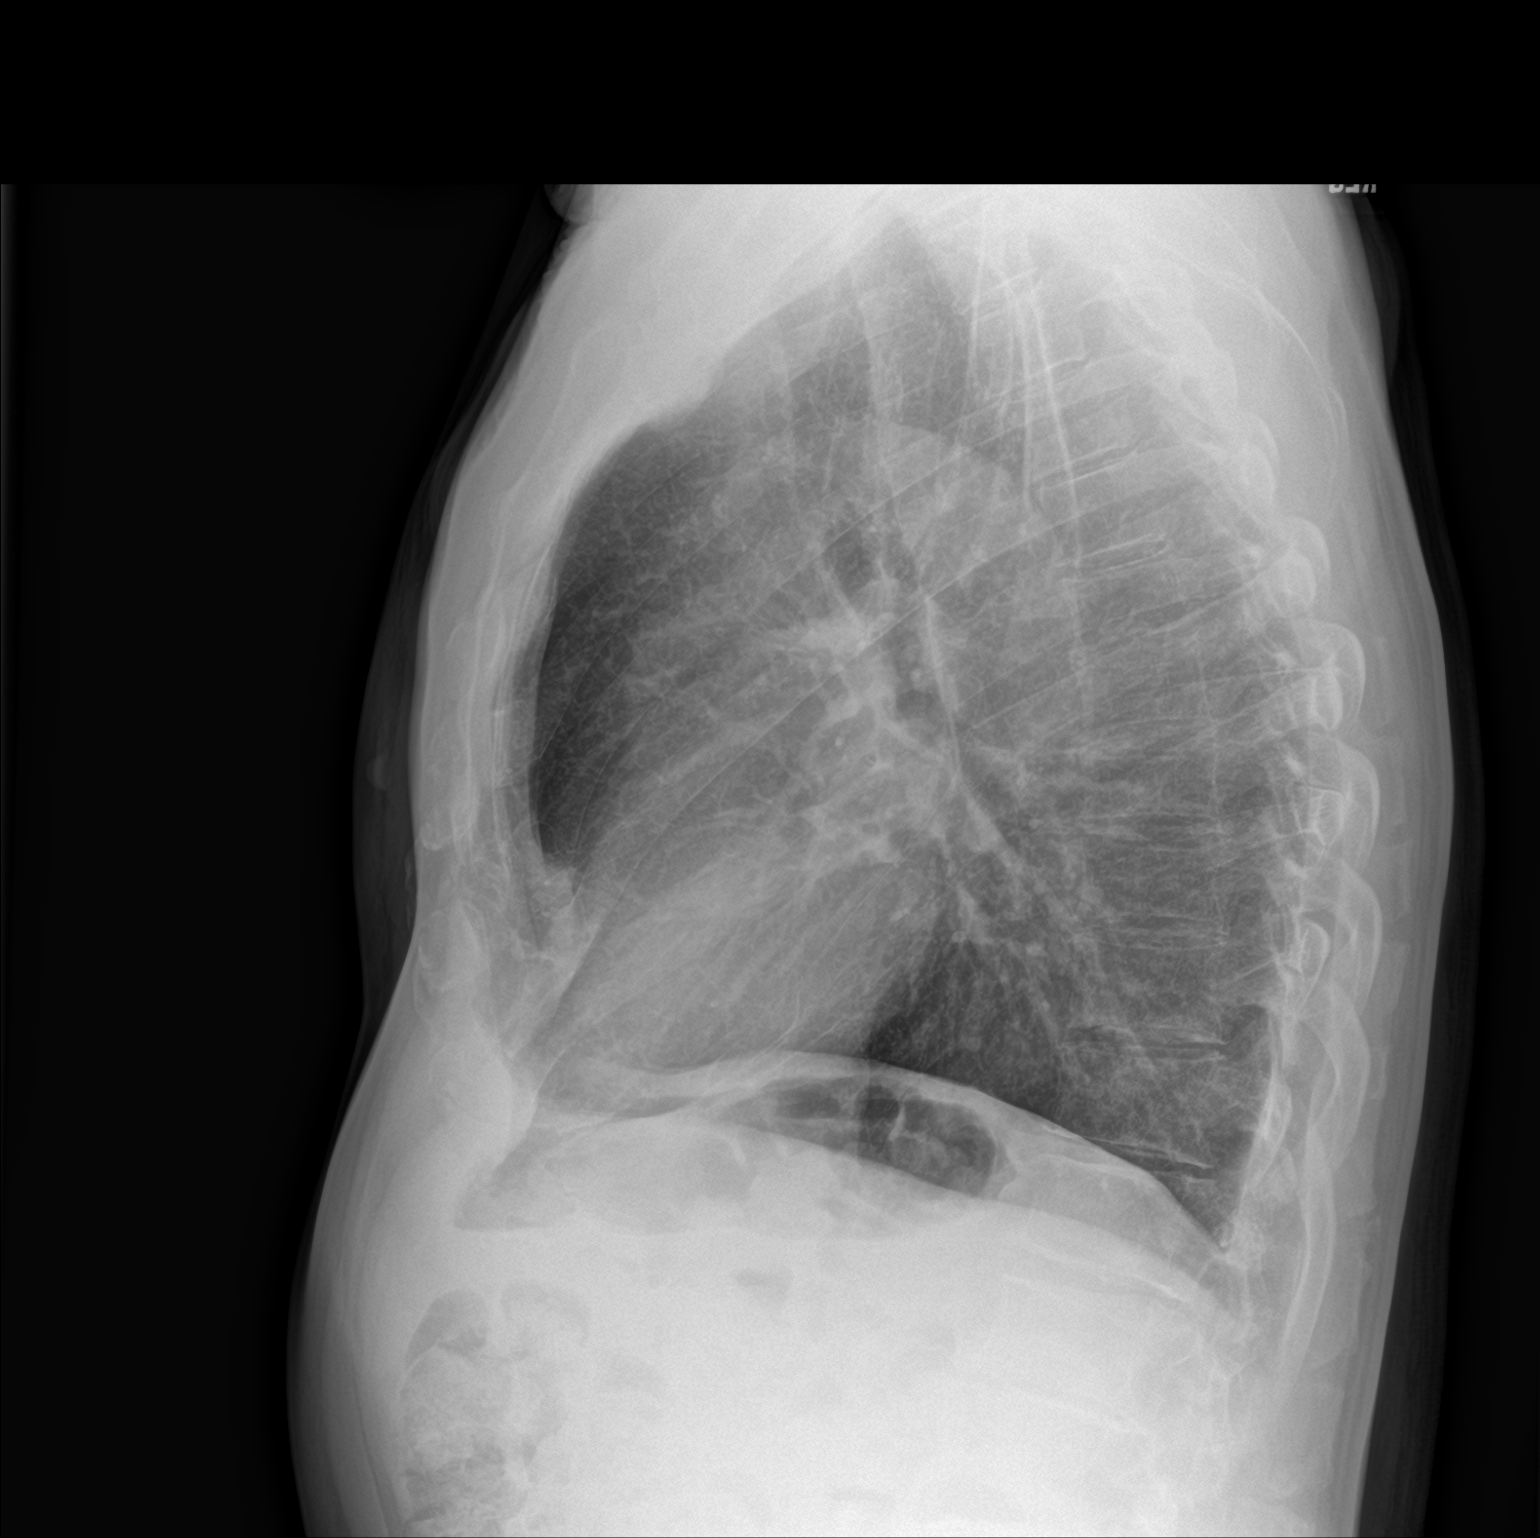

[2 of 2 positions shown; findings below may reference images not displayed]

FINDINGS: Midline trachea. Normal heart size. Atherosclerosis in the
transverse aorta. No pleural effusion or pneumothorax. Mild diffuse
interstitial thickening. No suspicious pulmonary nodule. Prominent
anterior first right rib. Mild hyperinflation.
IMPRESSION: No acute cardiopulmonary disease.

Hyperinflation and interstitial thickening, suggesting COPD/chronic
bronchitis.

## 2022-01-17 DIAGNOSIS — J069 Acute upper respiratory infection, unspecified: Secondary | ICD-10-CM | POA: Diagnosis not present

## 2022-01-25 DIAGNOSIS — J209 Acute bronchitis, unspecified: Secondary | ICD-10-CM | POA: Diagnosis not present

## 2022-01-25 DIAGNOSIS — F17210 Nicotine dependence, cigarettes, uncomplicated: Secondary | ICD-10-CM | POA: Diagnosis not present

## 2022-01-28 ENCOUNTER — Telehealth: Payer: Self-pay

## 2022-01-28 NOTE — Telephone Encounter (Signed)
Pt called requesting a letter excusing him from jury duty.   I reviewed pts chart and pt was advised he is on observation at this time and will need to contact his PCP. Pt advised he wasn't sure who to call given his issues with sitting for long periods of time are related to nerve issues in his back diagnosed before cancer was. Pt expressed understanding of this and states he will call Dr. Kenton Kingfisher, his PCP to have this request addressed.

## 2022-02-08 ENCOUNTER — Telehealth: Payer: Self-pay | Admitting: Internal Medicine

## 2022-02-08 NOTE — Telephone Encounter (Signed)
Called patient regarding upcoming July appointments, patient is notified.

## 2022-02-17 DIAGNOSIS — R102 Pelvic and perineal pain: Secondary | ICD-10-CM | POA: Diagnosis not present

## 2022-03-07 ENCOUNTER — Ambulatory Visit (HOSPITAL_COMMUNITY)
Admission: RE | Admit: 2022-03-07 | Discharge: 2022-03-07 | Disposition: A | Discharge status: Home / Self Care | Payer: PPO | Source: Ambulatory Visit | Attending: Physician Assistant | Admitting: Physician Assistant

## 2022-03-07 ENCOUNTER — Other Ambulatory Visit: Payer: Self-pay

## 2022-03-07 ENCOUNTER — Inpatient Hospital Stay: Payer: PPO | Attending: Internal Medicine

## 2022-03-07 DIAGNOSIS — J439 Emphysema, unspecified: Secondary | ICD-10-CM | POA: Diagnosis not present

## 2022-03-07 DIAGNOSIS — C3432 Malignant neoplasm of lower lobe, left bronchus or lung: Secondary | ICD-10-CM | POA: Insufficient documentation

## 2022-03-07 DIAGNOSIS — C3490 Malignant neoplasm of unspecified part of unspecified bronchus or lung: Secondary | ICD-10-CM | POA: Diagnosis not present

## 2022-03-07 DIAGNOSIS — Z79899 Other long term (current) drug therapy: Secondary | ICD-10-CM | POA: Insufficient documentation

## 2022-03-07 DIAGNOSIS — R911 Solitary pulmonary nodule: Secondary | ICD-10-CM | POA: Diagnosis not present

## 2022-03-07 LAB — CBC WITH DIFFERENTIAL (CANCER CENTER ONLY)
Abs Immature Granulocytes: 0.02 10*3/uL (ref 0.00–0.07)
Basophils Absolute: 0.1 10*3/uL (ref 0.0–0.1)
Basophils Relative: 1 %
Eosinophils Absolute: 0.3 10*3/uL (ref 0.0–0.5)
Eosinophils Relative: 3 %
HCT: 37.7 % — ABNORMAL LOW (ref 39.0–52.0)
Hemoglobin: 13.8 g/dL (ref 13.0–17.0)
Immature Granulocytes: 0 %
Lymphocytes Relative: 30 %
Lymphs Abs: 2.7 10*3/uL (ref 0.7–4.0)
MCH: 37.1 pg — ABNORMAL HIGH (ref 26.0–34.0)
MCHC: 36.6 g/dL — ABNORMAL HIGH (ref 30.0–36.0)
MCV: 101.3 fL — ABNORMAL HIGH (ref 80.0–100.0)
Monocytes Absolute: 0.7 10*3/uL (ref 0.1–1.0)
Monocytes Relative: 8 %
Neutro Abs: 5.3 10*3/uL (ref 1.7–7.7)
Neutrophils Relative %: 58 %
Platelet Count: 290 10*3/uL (ref 150–400)
RBC: 3.72 MIL/uL — ABNORMAL LOW (ref 4.22–5.81)
RDW: 12.2 % (ref 11.5–15.5)
WBC Count: 9 10*3/uL (ref 4.0–10.5)
nRBC: 0 % (ref 0.0–0.2)

## 2022-03-07 LAB — CMP (CANCER CENTER ONLY)
ALT: 16 U/L (ref 0–44)
AST: 23 U/L (ref 15–41)
Albumin: 4.1 g/dL (ref 3.5–5.0)
Alkaline Phosphatase: 80 U/L (ref 38–126)
Anion gap: 6 (ref 5–15)
BUN: 19 mg/dL (ref 8–23)
CO2: 31 mmol/L (ref 22–32)
Calcium: 9.7 mg/dL (ref 8.9–10.3)
Chloride: 92 mmol/L — ABNORMAL LOW (ref 98–111)
Creatinine: 1.58 mg/dL — ABNORMAL HIGH (ref 0.61–1.24)
GFR, Estimated: 47 mL/min — ABNORMAL LOW (ref >60)
Glucose, Bld: 104 mg/dL — ABNORMAL HIGH (ref 70–99)
Potassium: 3.7 mmol/L (ref 3.5–5.1)
Sodium: 129 mmol/L — ABNORMAL LOW (ref 135–145)
Total Bilirubin: 0.4 mg/dL (ref 0.3–1.2)
Total Protein: 7.7 g/dL (ref 6.5–8.1)

## 2022-03-07 MED ORDER — IOHEXOL 300 MG/ML  SOLN
75.0000 mL | Freq: Once | INTRAMUSCULAR | Status: AC | PRN
  Administered 2022-03-07: 75 mL via INTRAVENOUS

## 2022-03-09 ENCOUNTER — Other Ambulatory Visit: Payer: Self-pay

## 2022-03-09 ENCOUNTER — Inpatient Hospital Stay (HOSPITAL_BASED_OUTPATIENT_CLINIC_OR_DEPARTMENT_OTHER): Payer: PPO | Admitting: Internal Medicine

## 2022-03-09 ENCOUNTER — Encounter: Payer: Self-pay | Admitting: *Deleted

## 2022-03-09 VITALS — BP 128/72 | HR 78 | Temp 97.6°F | Resp 18 | Ht 71.0 in | Wt 173.5 lb

## 2022-03-09 DIAGNOSIS — C3412 Malignant neoplasm of upper lobe, left bronchus or lung: Secondary | ICD-10-CM | POA: Diagnosis not present

## 2022-03-09 DIAGNOSIS — C3432 Malignant neoplasm of lower lobe, left bronchus or lung: Secondary | ICD-10-CM | POA: Diagnosis not present

## 2022-03-09 DIAGNOSIS — C3490 Malignant neoplasm of unspecified part of unspecified bronchus or lung: Secondary | ICD-10-CM

## 2022-03-09 DIAGNOSIS — Z79899 Other long term (current) drug therapy: Secondary | ICD-10-CM | POA: Diagnosis not present

## 2022-03-09 NOTE — Patient Instructions (Signed)
1-800-QUIT-SMART Smoking Tobacco Information, Adult Smoking tobacco can be harmful to your health. Tobacco contains a toxic colorless chemical called nicotine. Nicotine causes changes in your brain that make you want more and more. This is called addiction. This can make it hard to stop smoking once you start. Tobacco also has other toxic chemicals that can hurt your body and raise your risk of many cancers. Menthol or "lite" tobacco or cigarette brands are not safer than regular brands. How can smoking tobacco affect me? Smoking tobacco puts you at risk for: Cancer. Smoking is most commonly associated with lung cancer, but can also lead to cancer in other parts of the body. Chronic obstructive pulmonary disease (COPD). This is a long-term lung condition that makes it hard to breathe. It also gets worse over time. High blood pressure (hypertension), heart disease, stroke, heart attack, and lung infections, such as pneumonia. Cataracts. This is when the lenses in the eyes become clouded. Digestive problems. This may include peptic ulcers, heartburn, and gastroesophageal reflux disease (GERD). Oral health problems, such as gum disease, mouth sores, and tooth loss. Loss of taste and smell. Smoking also affects how you look and smell. Smoking may cause: Wrinkles. Yellow or stained teeth, fingers, and fingernails. Bad breath. Bad-smelling clothes and hair. Smoking tobacco can also affect your social life, because: It may be challenging to find places to smoke when away from home. Many workplaces, [employer], hotels, and public places are tobacco-free. Smoking is expensive. This is due to the cost of tobacco and the long-term costs of treating health problems from smoking. Secondhand smoke may affect those around you. Secondhand smoke can cause lung cancer, breathing problems, and heart disease. Children of smokers have a higher risk for: Sudden infant death syndrome (SIDS). Ear infections. Lung  infections. What actions can I take to prevent health problems? Quit smoking  Do not start smoking. Quit if you already smoke. Do not replace cigarette smoking with vaping devices, such as e-cigarettes. Make a plan to quit smoking and commit to it. Look for programs to help you, and ask your health care provider for recommendations and ideas. Set a date and write down all the reasons you want to quit. Let your friends and family know you are quitting so they can help and support you. Consider finding friends who also want to quit. It can be easier to quit with someone else, so that you can support each other. Talk with your health care provider about using nicotine replacement medicines to help you quit. These include gum, lozenges, patches, sprays, or pills. If you try to quit but return to smoking, stay positive. It is common to slip up when you first quit, so take it one day at a time. Be prepared for cravings. When you feel the urge to smoke, chew gum or suck on hard candy. Lifestyle Stay busy. Take care of your body. Get plenty of exercise, eat a healthy diet, and drink plenty of water. Find ways to manage your stress, such as meditation, yoga, exercise, or time spent with friends and family. Ask your health care provider about having regular tests (screenings) to check for cancer. This may include blood tests, imaging tests, and other tests. Where to find support To get support to quit smoking, consider: Asking your health care provider for more information and resources. Joining a support group for people who want to quit smoking in your local community. There are many effective programs that may help you to quit. Calling the smokefree.gov  counselor helpline at 1-800-QUIT-NOW (320)290-9961). Where to find more information You may find more information about quitting smoking from: Centers for Disease Control and Prevention: https://www.chang-huffman.com/ https://hall.com/: smokefree.gov American Lung  Association: freedomfromsmoking.org Contact a health care provider if: You have problems breathing. Your lips, nose, or fingers turn blue. You have chest pain. You are coughing up blood. You feel like you will faint. You have other health changes that cause you to worry. Summary Smoking tobacco can negatively affect your health, the health of those around you, your finances, and your social life. Do not start smoking. Quit if you already smoke. If you need help quitting, ask your health care provider. Consider joining a support group for people in your local community who want to quit smoking. There are many effective programs that may help you to quit. This information is not intended to replace advice given to you by your health care provider. Make sure you discuss any questions you have with your health care provider. Document Revised: 08/10/2021 Document Reviewed: 08/10/2021 Elsevier Patient Education  [place].

## 2022-03-09 NOTE — Progress Notes (Signed)
[facility] [phone]   Fax:([phone]  OFFICE PROGRESS NOTE  Sh[place]line Frees, MD [place] [address]  DIAGNOSIS: Stage IA (T1b, N0, M0) non-small cell lung cancer, adenocarcinoma.  The patient presented with a left lower lobe lung nodule.  She was diagnosed in February 2023.   PRIOR THERAPY: SBRT to the left lower lobe lung nodule under the care of Dr. Lisbeth Renshaw completed on November 25, 2021  CURRENT THERAPY: Observation  INTERVAL HISTORY: Chris Holloway 71 y.o. male returns to the clinic today for follow-up visit.  The patient is feeling fine today with no concerning complaints except for cough and shortness of breath with exertion.  He denied having any chest pain or hemoptysis.  He has no nausea, vomiting, diarrhea or constipation.  He has no headache or visual changes.  He recently underwent SBRT to the left lower lobe lung nodule under the care of Dr. Lisbeth Renshaw.  The patient is currently on observation and feeling fine.  He had repeat CT scan of the chest performed recently and he is here for evaluation and discussion of his scan results.  MEDICAL HISTORY: Past Medical History:  Diagnosis Date   Arthritis    hands back,    Chronic kidney disease    Per Dr. Kenton Kingfisher but not with Dr. Tresa Moore   Hypertension    Stroke [facility]) 1998   mild stroke like symptoms lasted 5 min.    ALLERGIES:  has No Known Allergies.  MEDICATIONS:  Current Outpatient Medications  Medication Sig Dispense Refill   albuterol (VENTOLIN HFA) 108 (90 Base) MCG/ACT inhaler Inhale 2 puffs into the lungs every 6 (six) hours as needed for wheezing or shortness of breath.     ALPRAZolam (XANAX) 1 MG tablet Take 1.5 mg by mouth at bedtime.     aspirin EC 81 MG tablet Take 81 mg by mouth at bedtime. Swallow whole.     cetirizine (ZYRTEC) 10 MG tablet Take 10 mg by mouth daily.     chlorthalidone (HYGROTON) 25 MG tablet Take 25 mg by mouth at bedtime.      cholecalciferol (VITAMIN D3) 25 MCG (1000 UNIT) tablet Take 1,000 Units by mouth daily.     CRANBERRY PO Take 2 tablets by mouth daily.     gabapentin (NEURONTIN) 100 MG capsule Take 100 mg by mouth 2 (two) times daily as needed (pain).     guaifenesin (HUMIBID E) 400 MG TABS tablet Take 400 mg by mouth in the morning and at bedtime.     omega-3 acid ethyl esters (LOVAZA) 1 g capsule Take 3,000 mg by mouth at bedtime.     Polyvinyl Alcohol-Povidone (CLEAR EYES ALL SEASONS) 5-6 MG/ML SOLN Place 1 drop into both eyes daily as needed (Dry eyes).     pravastatin (PRAVACHOL) 40 MG tablet Take 40 mg by mouth at bedtime.     sildenafil (VIAGRA) 100 MG tablet Take 100 mg by mouth daily as needed for erectile dysfunction.     Turmeric 500 MG CAPS Take 500 mg by mouth in the morning and at bedtime.     zinc gluconate 50 MG tablet Take 50 mg by mouth at bedtime.     No current facility-administered medications for this visit.    SURGICAL HISTORY:  Past Surgical History:  Procedure Laterality Date   BRONCHIAL BIOPSY  10/19/2021   Procedure: BRONCHIAL BIOPSIES;  Surgeon: Garner Nash, DO;  Location: [facility];  Service: Pulmonary;;   BRONCHIAL NEEDLE ASPIRATION BIOPSY  10/19/2021   Procedure: BRONCHIAL NEEDLE ASPIRATION BIOPSIES;  Surgeon: Garner Nash, DO;  Location: [facility];  Service: Pulmonary;;   FIDUCIAL MARKER PLACEMENT  10/19/2021   Procedure: FIDUCIAL MARKER PLACEMENT;  Surgeon: Garner Nash, DO;  Location: [facility];  Service: Pulmonary;;   HAND LIGAMENT RECONSTRUCTION Right 1990s   Saw injury   ROBOTIC ASSITED PARTIAL NEPHRECTOMY Left 07/15/2020   Procedure: XI ROBOTIC ASSITED RADICAL NEPHRECTOMY WITH INTRAOPERAATIVE ULTRASOUND;  Surgeon: Alexis Frock, MD;  Location: [facility];  Service: Urology;  Laterality: Left;  3 HRS   SHOULDER ARTHROSCOPY W/ ROTATOR CUFF REPAIR Left 08/2018   VIDEO BRONCHOSCOPY WITH RADIAL ENDOBRONCHIAL ULTRASOUND  10/19/2021   Procedure: RADIAL  ENDOBRONCHIAL ULTRASOUND;  Surgeon: Garner Nash, DO;  Location: [facility];  Service: Pulmonary;;    REVIEW OF SYSTEMS:  A comprehensive review of systems was negative except for: Respiratory: positive for cough and dyspnea on exertion   PHYSICAL EXAMINATION: General appearance: alert, cooperative, and no distress Head: Normocephalic, without obvious abnormality, atraumatic Neck: no adenopathy, no JVD, supple, symmetrical, trachea midline, and thyroid not enlarged, symmetric, no tenderness/mass/nodules Lymph nodes: Cervical, supraclavicular, and axillary nodes normal. Resp: clear to auscultation bilaterally Back: symmetric, no curvature. ROM normal. No CVA tenderness. Cardio: regular rate and rhythm, S1, S2 normal, no murmur, click, rub or gallop GI: soft, non-tender; bowel sounds normal; no masses,  no organomegaly Extremities: extremities normal, atraumatic, no cyanosis or edema  ECOG PERFORMANCE STATUS: 1 - Symptomatic but completely ambulatory  Blood pressure 128/72, pulse 78, temperature 97.6 F (36.4 C), temperature source Tympanic, resp. rate 18, height 5\' 11"  (1.803 m), weight 173 lb 8 oz (78.7 kg), SpO2 98 %.  LABORATORY DATA: Lab Results  Component Value Date   WBC 9.0 03/07/2022   HGB 13.8 03/07/2022   HCT 37.7 (L) 03/07/2022   MCV 101.3 (H) 03/07/2022   PLT 290 03/07/2022      Chemistry      Component Value Date/Time   NA 129 (L) 03/07/2022 1520   K 3.7 03/07/2022 1520   CL 92 (L) 03/07/2022 1520   CO2 31 03/07/2022 1520   BUN 19 03/07/2022 1520   CREATININE 1.58 (H) 03/07/2022 1520      Component Value Date/Time   CALCIUM 9.7 03/07/2022 1520   ALKPHOS 80 03/07/2022 1520   AST 23 03/07/2022 1520   ALT 16 03/07/2022 1520   BILITOT 0.4 03/07/2022 1520       RADIOGRAPHIC STUDIES: CT Chest W Contrast  Result Date: 03/08/2022 CLINICAL DATA:  Primary Cancer Type: Lung Imaging Indication: Assess response to therapy Interval therapy since last imaging?  Yes Initial Cancer Diagnosis Date: 10/19/2021; Established by: Biopsy-proven Detailed Pathology: Stage I non-small cell lung cancer, adenocarcinoma. Primary Tumor location: Left lower lobe. Surgeries: No thoracic. Left nephrectomy. Chemotherapy: No Immunotherapy? No Radiation therapy? Yes Other Cancer Therapies: Renal cell carcinoma; left radical nephrectomy 07/15/2020. * Tracking Code: BO * EXAM: CT CHEST WITH CONTRAST TECHNIQUE: Multidetector CT imaging of the chest was performed during intravenous contrast administration. RADIATION DOSE REDUCTION: This exam was performed according to the departmental dose-optimization program which includes automated exposure control, adjustment of the mA and/or kV according to patient size and/or use of iterative reconstruction technique. CONTRAST:  61mL OMNIPAQUE IOHEXOL 300 MG/ML  SOLN COMPARISON:  Most recent CT chest 10/15/2021.  09/21/2021 PET-CT. FINDINGS: Cardiovascular: The heart is normal in size. No pericardial effusion. Stable age advanced atherosclerotic calcifications involving the  thoracic aorta but no aneurysm or dissection. The branch vessels are patent. Moderate atherosclerotic calcification and moderate stenosis at the origin of the left subclavian artery. Stable age advanced three-vessel coronary artery calcifications. The pulmonary arteries are unremarkable. Mediastinum/Nodes: Stable scattered mediastinal and hilar lymph nodes but no mass or overt adenopathy. The esophagus is unremarkable. Lungs/Pleura: Stable underlying emphysematous changes and areas of pulmonary scarring. Axilla. Post treatment changes involving the medial aspect of the left lower lobe. Residual soft tissue thickening along the lateral aspect of the aorta measuring approximately 12 x 6 mm on image 94/2. This previously measured approximately 17.5 x 13 mm. Surrounding radiation changes. Stable scattered tree-in-bud type opacities in the right middle lobe. Stable subpleural nodules in the  left upper lobe and also in the right upper lobe. Stable numerous nodular lesions in the left lower lobe at the lung base. Small subpleural nodule in the lingula anteriorly is stable. No pleural effusions or pleural lesions. Upper Abdomen: No significant upper abdominal findings. Status post left nephrectomy. No upper abdominal mass or adenopathy. Stable age advanced vascular calcifications. Musculoskeletal: No chest wall mass, supraclavicular or axillary adenopathy. The bony thorax is intact. IMPRESSION: 1. Post treatment changes involving the medial aspect of the left lower lobe. Interval decrease in size of the pulmonary neoplasm. 2. Stable scattered mediastinal and hilar lymph nodes but no mass or overt adenopathy. 3. Stable tree-in-bud nodularity in the right middle lobe and scattered subpleural nodules in both lungs. Stable several nodular opacities at the left lung base. No new or progressive findings. Recommend continued observation. 4. Stable age advanced atherosclerotic calcifications involving the thoracic and abdominal aorta and branch vessels including the coronary arteries. Aortic Atherosclerosis (ICD10-I70.0) and Emphysema (ICD10-J43.9). Electronically Signed   By: Marijo Sanes M.D.   On: 03/08/2022 12:16    ASSESSMENT AND PLAN: This is a very pleasant 71 years old white male diagnosed with a stage Ia (T1b, N0, M0) non-small cell lung cancer presented with left lower lobe lung nodule diagnosed in February 2023 status post SBRT to the left lower lobe lung nodule completed on November 25, 2021. The patient is currently on observation and he is feeling fine except for the cough and shortness of breath with exertion. He had repeat CT scan of the chest performed recently.  I personally and independently reviewed the scans and discussed the result with the patient today. His scan showed no concerning findings for disease progression and there was decrease in the size of the left lower lobe pulmonary  nodule. He also has some radiation changes. I recommended for the patient to continue on observation with repeat CT scan of the chest in 6 months. For the smoking cessation, we gave the patient information about smoking cessation. He was advised to call immediately if he has any concerning symptoms in the interval. The patient voices understanding of current disease status and treatment options and is in agreement with the current care plan.  All questions were answered. The patient knows to call the clinic with any problems, questions or concerns. We can certainly see the patient much sooner if necessary.  The total time spent in the appointment was 20 minutes.  Disclaimer: This note was dictated with voice recognition software. Similar sounding words can inadvertently be transcribed and may not be corrected upon review.

## 2022-03-09 NOTE — Progress Notes (Signed)
I spoke with Chris Holloway today. He wants to quit smoking. I added information on how to quit on his AVS. I also provided him with resources.

## 2022-03-18 ENCOUNTER — Telehealth: Payer: Self-pay | Admitting: Internal Medicine

## 2022-03-18 NOTE — Telephone Encounter (Signed)
Called patient regarding upcoming appointment, patient is notified. °

## 2022-04-27 DIAGNOSIS — E7800 Pure hypercholesterolemia, unspecified: Secondary | ICD-10-CM | POA: Diagnosis not present

## 2022-04-27 DIAGNOSIS — Z1211 Encounter for screening for malignant neoplasm of colon: Secondary | ICD-10-CM | POA: Diagnosis not present

## 2022-04-27 DIAGNOSIS — Z125 Encounter for screening for malignant neoplasm of prostate: Secondary | ICD-10-CM | POA: Diagnosis not present

## 2022-04-27 DIAGNOSIS — F17200 Nicotine dependence, unspecified, uncomplicated: Secondary | ICD-10-CM | POA: Diagnosis not present

## 2022-04-27 DIAGNOSIS — F419 Anxiety disorder, unspecified: Secondary | ICD-10-CM | POA: Diagnosis not present

## 2022-04-27 DIAGNOSIS — A6002 Herpesviral infection of other male genital organs: Secondary | ICD-10-CM | POA: Diagnosis not present

## 2022-04-27 DIAGNOSIS — N183 Chronic kidney disease, stage 3 unspecified: Secondary | ICD-10-CM | POA: Diagnosis not present

## 2022-04-27 DIAGNOSIS — I10 Essential (primary) hypertension: Secondary | ICD-10-CM | POA: Diagnosis not present

## 2022-04-27 DIAGNOSIS — G8929 Other chronic pain: Secondary | ICD-10-CM | POA: Diagnosis not present

## 2022-04-27 DIAGNOSIS — Z0000 Encounter for general adult medical examination without abnormal findings: Secondary | ICD-10-CM | POA: Diagnosis not present

## 2022-04-27 DIAGNOSIS — C3490 Malignant neoplasm of unspecified part of unspecified bronchus or lung: Secondary | ICD-10-CM | POA: Diagnosis not present

## 2022-05-05 DIAGNOSIS — F419 Anxiety disorder, unspecified: Secondary | ICD-10-CM | POA: Diagnosis not present

## 2022-05-05 DIAGNOSIS — Z125 Encounter for screening for malignant neoplasm of prostate: Secondary | ICD-10-CM | POA: Diagnosis not present

## 2022-05-05 DIAGNOSIS — E7800 Pure hypercholesterolemia, unspecified: Secondary | ICD-10-CM | POA: Diagnosis not present

## 2022-05-05 DIAGNOSIS — I10 Essential (primary) hypertension: Secondary | ICD-10-CM | POA: Diagnosis not present

## 2022-05-05 DIAGNOSIS — R21 Rash and other nonspecific skin eruption: Secondary | ICD-10-CM | POA: Diagnosis not present

## 2022-05-20 DIAGNOSIS — L237 Allergic contact dermatitis due to plants, except food: Secondary | ICD-10-CM | POA: Diagnosis not present

## 2022-05-20 DIAGNOSIS — R21 Rash and other nonspecific skin eruption: Secondary | ICD-10-CM | POA: Diagnosis not present

## 2022-06-20 DIAGNOSIS — R058 Other specified cough: Secondary | ICD-10-CM | POA: Diagnosis not present

## 2022-06-20 DIAGNOSIS — B9721 SARS-associated coronavirus as the cause of diseases classified elsewhere: Secondary | ICD-10-CM | POA: Diagnosis not present

## 2022-08-08 DIAGNOSIS — Z125 Encounter for screening for malignant neoplasm of prostate: Secondary | ICD-10-CM | POA: Diagnosis not present

## 2022-08-08 DIAGNOSIS — C642 Malignant neoplasm of left kidney, except renal pelvis: Secondary | ICD-10-CM | POA: Diagnosis not present

## 2022-08-10 DIAGNOSIS — I700 Atherosclerosis of aorta: Secondary | ICD-10-CM | POA: Diagnosis not present

## 2022-08-10 DIAGNOSIS — C642 Malignant neoplasm of left kidney, except renal pelvis: Secondary | ICD-10-CM | POA: Diagnosis not present

## 2022-08-10 DIAGNOSIS — R911 Solitary pulmonary nodule: Secondary | ICD-10-CM | POA: Diagnosis not present

## 2022-08-10 DIAGNOSIS — K5641 Fecal impaction: Secondary | ICD-10-CM | POA: Diagnosis not present

## 2022-08-15 DIAGNOSIS — Z905 Acquired absence of kidney: Secondary | ICD-10-CM | POA: Diagnosis not present

## 2022-08-15 DIAGNOSIS — R911 Solitary pulmonary nodule: Secondary | ICD-10-CM | POA: Diagnosis not present

## 2022-08-15 DIAGNOSIS — C642 Malignant neoplasm of left kidney, except renal pelvis: Secondary | ICD-10-CM | POA: Diagnosis not present

## 2022-09-08 ENCOUNTER — Inpatient Hospital Stay: Payer: PPO | Attending: Internal Medicine

## 2022-09-08 ENCOUNTER — Ambulatory Visit (HOSPITAL_COMMUNITY)
Admission: RE | Admit: 2022-09-08 | Discharge: 2022-09-08 | Disposition: A | Discharge status: Home / Self Care | Payer: PPO | Source: Ambulatory Visit | Attending: Internal Medicine | Admitting: Internal Medicine

## 2022-09-08 DIAGNOSIS — C3490 Malignant neoplasm of unspecified part of unspecified bronchus or lung: Secondary | ICD-10-CM

## 2022-09-08 DIAGNOSIS — J439 Emphysema, unspecified: Secondary | ICD-10-CM | POA: Diagnosis not present

## 2022-09-08 DIAGNOSIS — Z85118 Personal history of other malignant neoplasm of bronchus and lung: Secondary | ICD-10-CM | POA: Insufficient documentation

## 2022-09-08 DIAGNOSIS — R918 Other nonspecific abnormal finding of lung field: Secondary | ICD-10-CM | POA: Diagnosis not present

## 2022-09-08 DIAGNOSIS — Z923 Personal history of irradiation: Secondary | ICD-10-CM | POA: Insufficient documentation

## 2022-09-08 LAB — CBC WITH DIFFERENTIAL (CANCER CENTER ONLY)
Abs Immature Granulocytes: 0.03 10*3/uL (ref 0.00–0.07)
Basophils Absolute: 0.1 10*3/uL (ref 0.0–0.1)
Basophils Relative: 1 %
Eosinophils Absolute: 0.2 10*3/uL (ref 0.0–0.5)
Eosinophils Relative: 2 %
HCT: 38.7 % — ABNORMAL LOW (ref 39.0–52.0)
Hemoglobin: 13.9 g/dL (ref 13.0–17.0)
Immature Granulocytes: 0 %
Lymphocytes Relative: 30 %
Lymphs Abs: 2.8 10*3/uL (ref 0.7–4.0)
MCH: 37.1 pg — ABNORMAL HIGH (ref 26.0–34.0)
MCHC: 35.9 g/dL (ref 30.0–36.0)
MCV: 103.2 fL — ABNORMAL HIGH (ref 80.0–100.0)
Monocytes Absolute: 0.7 10*3/uL (ref 0.1–1.0)
Monocytes Relative: 8 %
Neutro Abs: 5.6 10*3/uL (ref 1.7–7.7)
Neutrophils Relative %: 59 %
Platelet Count: 315 10*3/uL (ref 150–400)
RBC: 3.75 MIL/uL — ABNORMAL LOW (ref 4.22–5.81)
RDW: 12.3 % (ref 11.5–15.5)
WBC Count: 9.4 10*3/uL (ref 4.0–10.5)
nRBC: 0 % (ref 0.0–0.2)

## 2022-09-08 LAB — CMP (CANCER CENTER ONLY)
ALT: 17 U/L (ref 0–44)
AST: 23 U/L (ref 15–41)
Albumin: 4.2 g/dL (ref 3.5–5.0)
Alkaline Phosphatase: 69 U/L (ref 38–126)
Anion gap: 8 (ref 5–15)
BUN: 28 mg/dL — ABNORMAL HIGH (ref 8–23)
CO2: 31 mmol/L (ref 22–32)
Calcium: 10.2 mg/dL (ref 8.9–10.3)
Chloride: 95 mmol/L — ABNORMAL LOW (ref 98–111)
Creatinine: 1.64 mg/dL — ABNORMAL HIGH (ref 0.61–1.24)
GFR, Estimated: 45 mL/min — ABNORMAL LOW (ref >60)
Glucose, Bld: 91 mg/dL (ref 70–99)
Potassium: 4.1 mmol/L (ref 3.5–5.1)
Sodium: 134 mmol/L — ABNORMAL LOW (ref 135–145)
Total Bilirubin: 0.5 mg/dL (ref 0.3–1.2)
Total Protein: 7.9 g/dL (ref 6.5–8.1)

## 2022-09-08 MED ORDER — IOHEXOL 300 MG/ML  SOLN
60.0000 mL | Freq: Once | INTRAMUSCULAR | Status: AC | PRN
  Administered 2022-09-08: 60 mL via INTRAVENOUS

## 2022-09-08 MED ORDER — SODIUM CHLORIDE (PF) 0.9 % IJ SOLN
INTRAMUSCULAR | Status: AC
  Filled 2022-09-08: qty 50

## 2022-09-12 ENCOUNTER — Other Ambulatory Visit: Payer: Self-pay

## 2022-09-12 ENCOUNTER — Inpatient Hospital Stay (HOSPITAL_BASED_OUTPATIENT_CLINIC_OR_DEPARTMENT_OTHER): Payer: PPO | Admitting: Internal Medicine

## 2022-09-12 VITALS — BP 137/75 | HR 78 | Temp 98.0°F | Resp 17 | Wt 175.6 lb

## 2022-09-12 DIAGNOSIS — C3490 Malignant neoplasm of unspecified part of unspecified bronchus or lung: Secondary | ICD-10-CM | POA: Diagnosis not present

## 2022-09-12 DIAGNOSIS — Z923 Personal history of irradiation: Secondary | ICD-10-CM | POA: Diagnosis not present

## 2022-09-12 DIAGNOSIS — Z85118 Personal history of other malignant neoplasm of bronchus and lung: Secondary | ICD-10-CM | POA: Diagnosis not present

## 2022-09-12 NOTE — Progress Notes (Signed)
[facility] [phone]   [phone]  OFFICE PROGRESS NOTE  Johny Blamer, MD [address] [place] [address]  DIAGNOSIS: Stage IA (T1b, N0, M0) non-small cell lung cancer, adenocarcinoma.  The patient presented with a left lower lobe lung nodule.  She was diagnosed in February 2023.   PRIOR THERAPY: SBRT to the left lower lobe lung nodule under the care of Dr. Mitzi Hansen completed on November 25, 2021  CURRENT THERAPY: Observation  INTERVAL HISTORY: Chris Holloway 72 y.o. male returns to the clinic today for follow-up visit accompanied by his girlfriend.  The patient is feeling fine today with no concerning complaints except for mild cough and occasional shortness of breath.  He denied having any chest pain or hemoptysis.  He has no nausea, vomiting, diarrhea or constipation.  He has no headache or visual changes.  He has no recent weight loss or night sweats.  He had repeat CT scan of the chest performed recently and he is here for evaluation and discussion of his scan results.  MEDICAL HISTORY: Past Medical History:  Diagnosis Date   Arthritis    hands back,    Chronic kidney disease    Per Dr. Tiburcio Pea but not with Dr. Berneice Heinrich   Hypertension    Stroke [facility]) 1998   mild stroke like symptoms lasted 5 min.    ALLERGIES:  has No Known Allergies.  MEDICATIONS:  Current Outpatient Medications  Medication Sig Dispense Refill   albuterol (VENTOLIN HFA) 108 (90 Base) MCG/ACT inhaler Inhale 2 puffs into the lungs every 6 (six) hours as needed for wheezing or shortness of breath. (Patient not taking: Reported on 03/09/2022)     ALPRAZolam (XANAX) 1 MG tablet Take 1.5 mg by mouth at bedtime.     Ascorbic Acid (VITAMIN C) 1000 MG tablet Take 1,000 mg by mouth daily.     aspirin EC 81 MG tablet Take 81 mg by mouth at bedtime. Swallow whole.     cetirizine (ZYRTEC) 10 MG tablet Take 10 mg by mouth daily.     chlorthalidone (HYGROTON) 25 MG  tablet Take 25 mg by mouth at bedtime.     cholecalciferol (VITAMIN D3) 25 MCG (1000 UNIT) tablet Take 1,000 Units by mouth daily.     CRANBERRY PO Take 2 tablets by mouth daily.     gabapentin (NEURONTIN) 100 MG capsule Take 100 mg by mouth 2 (two) times daily as needed (pain).     guaifenesin (HUMIBID E) 400 MG TABS tablet Take 400 mg by mouth in the morning and at bedtime.     omega-3 acid ethyl esters (LOVAZA) 1 g capsule Take 3,000 mg by mouth at bedtime.     Polyvinyl Alcohol-Povidone (CLEAR EYES ALL SEASONS) 5-6 MG/ML SOLN Place 1 drop into both eyes daily as needed (Dry eyes).     pravastatin (PRAVACHOL) 40 MG tablet Take 40 mg by mouth at bedtime.     sildenafil (VIAGRA) 100 MG tablet Take 100 mg by mouth daily as needed for erectile dysfunction.     Turmeric 500 MG CAPS Take 500 mg by mouth in the morning and at bedtime.     valACYclovir (VALTREX) 500 MG tablet Take 500 mg by mouth daily.     zinc gluconate 50 MG tablet Take 50 mg by mouth at bedtime.     No current facility-administered medications for this visit.    SURGICAL HISTORY:  Past Surgical History:  Procedure Laterality Date   BRONCHIAL BIOPSY  10/19/2021   Procedure: BRONCHIAL BIOPSIES;  Surgeon: Josephine Igo, DO;  [facility];  Service: Pulmonary;;   BRONCHIAL NEEDLE ASPIRATION BIOPSY  10/19/2021   Procedure: BRONCHIAL NEEDLE ASPIRATION BIOPSIES;  Surgeon: Josephine Igo, DO;  [facility];  Service: Pulmonary;;   FIDUCIAL MARKER PLACEMENT  10/19/2021   Procedure: FIDUCIAL MARKER PLACEMENT;  Surgeon: Josephine Igo, DO;  [facility]: [facility];  Service: Pulmonary;;   HAND LIGAMENT RECONSTRUCTION Right 1990s   Saw injury   ROBOTIC ASSITED PARTIAL NEPHRECTOMY Left 07/15/2020   Procedure: XI ROBOTIC ASSITED RADICAL NEPHRECTOMY WITH INTRAOPERAATIVE ULTRASOUND;  Surgeon: Sebastian Ache, MD;  Location: [facility];  Service: Urology;  Laterality: Left;  3 HRS   SHOULDER ARTHROSCOPY W/ ROTATOR CUFF  REPAIR Left 08/2018   VIDEO BRONCHOSCOPY WITH RADIAL ENDOBRONCHIAL ULTRASOUND  10/19/2021   Procedure: RADIAL ENDOBRONCHIAL ULTRASOUND;  Surgeon: Josephine Igo, DO;  [facility];  Service: Pulmonary;;    REVIEW OF SYSTEMS:  A comprehensive review of systems was negative except for: Respiratory: positive for cough and dyspnea on exertion   PHYSICAL EXAMINATION: General appearance: alert, cooperative, fatigued, and no distress Head: Normocephalic, without obvious abnormality, atraumatic Neck: no adenopathy, no JVD, supple, symmetrical, trachea midline, and thyroid not enlarged, symmetric, no tenderness/mass/nodules Lymph nodes: Cervical, supraclavicular, and axillary nodes normal. Resp: clear to auscultation bilaterally Back: symmetric, no curvature. ROM normal. No CVA tenderness. Cardio: regular rate and rhythm, S1, S2 normal, no murmur, click, rub or gallop GI: soft, non-tender; bowel sounds normal; no masses,  no organomegaly Extremities: extremities normal, atraumatic, no cyanosis or edema  ECOG PERFORMANCE STATUS: 1 - Symptomatic but completely ambulatory  Blood pressure 137/75, pulse 78, temperature 98 F (36.7 C), temperature source Oral, resp. rate 17, weight 175 lb 9 oz (79.6 kg), SpO2 98 %.  LABORATORY DATA: Lab Results  Component Value Date   WBC 9.4 09/08/2022   HGB 13.9 09/08/2022   HCT 38.7 (L) 09/08/2022   MCV 103.2 (H) 09/08/2022   PLT 315 09/08/2022      Chemistry      Component Value Date/Time   NA 134 (L) 09/08/2022 1125   K 4.1 09/08/2022 1125   CL 95 (L) 09/08/2022 1125   CO2 31 09/08/2022 1125   BUN 28 (H) 09/08/2022 1125   CREATININE 1.64 (H) 09/08/2022 1125      Component Value Date/Time   CALCIUM 10.2 09/08/2022 1125   ALKPHOS 69 09/08/2022 1125   AST 23 09/08/2022 1125   ALT 17 09/08/2022 1125   BILITOT 0.5 09/08/2022 1125       RADIOGRAPHIC STUDIES: CT Chest W Contrast  Result Date: 09/09/2022 CLINICAL DATA:  Non-small cell  lung cancer restaging. SBRT to the left lower lobe lung nodule completed 11/25/2021. Left nephrectomy for left renal cancer. * Tracking Code: BO * EXAM: CT CHEST WITH CONTRAST TECHNIQUE: Multidetector CT imaging of the chest was performed during intravenous contrast administration. RADIATION DOSE REDUCTION: This exam was performed according to the departmental dose-optimization program which includes automated exposure control, adjustment of the mA and/or kV according to patient size and/or use of iterative reconstruction technique. CONTRAST:  50mL OMNIPAQUE IOHEXOL 300 MG/ML  SOLN COMPARISON:  08/10/2022 FINDINGS: Cardiovascular: Substantial atheromatous hard and soft plaque in the thoracic aorta and branch vessels. Atherosclerotic vascular calcification in the left anterior descending, circumflex, and right coronary arteries. Mediastinum/Nodes: Unremarkable Lungs/Pleura: Mild biapical pleuroparenchymal scarring. Centrilobular and paraseptal emphysema. The old granulomatous disease.  Peribronchovascular fiducial medially in the left lower lobe on image 100 series 5 with associated local volume loss and density not substantially changed from 08/10/2022 and the probable site of prior radiation therapy. Several scattered small pulmonary nodules are present. In addition, there is a bandlike nodule anteriorly in the left upper lobe measuring about 2.0 by 1.1 by 0.6 cm on image 26 series 6 and image 113 series 5 (formerly about 2.0 by 0.5 by 1.1 cm)) and nodularity along the left hemidiaphragm with a dominant nodule measuring about 2.4 by 1.0 by 0.9 cm on image 123 of series 5 (formerly same). Overall comparing the dominant nodularity there is no substantive change from 08/10/2022, although the left basilar nodules have increased for example from 10/15/2021. Upper Abdomen: Left nephrectomy. Musculoskeletal: Mild thoracic spondylosis. IMPRESSION: 1. Stable appearance of the left lower lobe fiducial and surrounding  radiation therapy related findings. 2. Scattered small pulmonary nodules are present, with dominant lesions at the left lung base (lingula and left lower lobe) stable from 08/10/2022 but increased when compared back to other exams such as 10/15/2021, and thus considered suspicious for low-grade malignancy. 3. Coronary atherosclerosis. 4. Left nephrectomy. 5. Emphysema. 6. Old granulomatous disease. 7. Mild thoracic spondylosis. 8. Substantial atheromatous hard and soft plaque in the thoracic aorta and branch vessels. Aortic Atherosclerosis (ICD10-I70.0) and Emphysema (ICD10-J43.9). Electronically Signed   By: Gaylyn Rong M.D.   On: 09/09/2022 10:07    ASSESSMENT AND PLAN: This is a very pleasant 72 years old white male diagnosed with a stage Ia (T1b, N0, M0) non-small cell lung cancer presented with left lower lobe lung nodule diagnosed in February 2023 status post SBRT to the left lower lobe lung nodule completed on November 25, 2021. The patient is currently on observation and he is doing fine with no concerning complaints. He had repeat CT scan of the chest performed recently.  I personally and independently reviewed the scan and discussed the results with the patient and his girlfriend. His scan showed no concerning findings for disease progression but he continues to have the scattered small pulmonary nodules that need close monitoring and they have been stable since the previous scan. I recommended for the patient to continue on observation with repeat CT scan of the chest in 6 months.  The patient has a lot of question about his medications and history of anemia. I recommended for him to continue on the oral iron tablet every other day in addition to folic acid and vitamin B supplements. I advised him to have an appointment with his primary care physician to go over his list of medications and cleared from unnecessary medications. The patient was advised to call immediately if he has any other  concerning symptoms in the interval. The patient voices understanding of current disease status and treatment options and is in agreement with the current care plan.  All questions were answered. The patient knows to call the clinic with any problems, questions or concerns. We can certainly see the patient much sooner if necessary.  The total time spent in the appointment was 30 minutes.  Disclaimer: This note was dictated with voice recognition software. Similar sounding words can inadvertently be transcribed and may not be corrected upon review.

## 2022-10-19 DIAGNOSIS — B370 Candidal stomatitis: Secondary | ICD-10-CM | POA: Diagnosis not present

## 2022-11-09 DIAGNOSIS — F17200 Nicotine dependence, unspecified, uncomplicated: Secondary | ICD-10-CM | POA: Diagnosis not present

## 2022-11-09 DIAGNOSIS — N529 Male erectile dysfunction, unspecified: Secondary | ICD-10-CM | POA: Diagnosis not present

## 2022-11-09 DIAGNOSIS — J449 Chronic obstructive pulmonary disease, unspecified: Secondary | ICD-10-CM | POA: Diagnosis not present

## 2022-11-09 DIAGNOSIS — N183 Chronic kidney disease, stage 3 unspecified: Secondary | ICD-10-CM | POA: Diagnosis not present

## 2022-11-09 DIAGNOSIS — I10 Essential (primary) hypertension: Secondary | ICD-10-CM | POA: Diagnosis not present

## 2022-11-09 DIAGNOSIS — E7800 Pure hypercholesterolemia, unspecified: Secondary | ICD-10-CM | POA: Diagnosis not present

## 2022-11-09 DIAGNOSIS — F419 Anxiety disorder, unspecified: Secondary | ICD-10-CM | POA: Diagnosis not present

## 2022-11-17 DIAGNOSIS — L237 Allergic contact dermatitis due to plants, except food: Secondary | ICD-10-CM | POA: Diagnosis not present

## 2022-12-20 DIAGNOSIS — S70369A Insect bite (nonvenomous), unspecified thigh, initial encounter: Secondary | ICD-10-CM | POA: Diagnosis not present

## 2022-12-20 DIAGNOSIS — W57XXXA Bitten or stung by nonvenomous insect and other nonvenomous arthropods, initial encounter: Secondary | ICD-10-CM | POA: Diagnosis not present

## 2023-03-09 ENCOUNTER — Ambulatory Visit (HOSPITAL_COMMUNITY)
Admission: RE | Admit: 2023-03-09 | Discharge: 2023-03-09 | Disposition: A | Discharge status: Home / Self Care | Payer: PPO | Source: Ambulatory Visit | Attending: Internal Medicine | Admitting: Internal Medicine

## 2023-03-09 ENCOUNTER — Inpatient Hospital Stay: Payer: PPO | Attending: Internal Medicine

## 2023-03-09 ENCOUNTER — Other Ambulatory Visit: Payer: Self-pay

## 2023-03-09 DIAGNOSIS — R918 Other nonspecific abnormal finding of lung field: Secondary | ICD-10-CM | POA: Diagnosis not present

## 2023-03-09 DIAGNOSIS — C3490 Malignant neoplasm of unspecified part of unspecified bronchus or lung: Secondary | ICD-10-CM | POA: Insufficient documentation

## 2023-03-09 DIAGNOSIS — N189 Chronic kidney disease, unspecified: Secondary | ICD-10-CM | POA: Insufficient documentation

## 2023-03-09 DIAGNOSIS — Z79899 Other long term (current) drug therapy: Secondary | ICD-10-CM | POA: Insufficient documentation

## 2023-03-09 DIAGNOSIS — I129 Hypertensive chronic kidney disease with stage 1 through stage 4 chronic kidney disease, or unspecified chronic kidney disease: Secondary | ICD-10-CM | POA: Insufficient documentation

## 2023-03-09 DIAGNOSIS — J432 Centrilobular emphysema: Secondary | ICD-10-CM | POA: Diagnosis not present

## 2023-03-09 DIAGNOSIS — C3432 Malignant neoplasm of lower lobe, left bronchus or lung: Secondary | ICD-10-CM | POA: Insufficient documentation

## 2023-03-09 LAB — CMP (CANCER CENTER ONLY)
ALT: 15 U/L (ref 0–44)
AST: 22 U/L (ref 15–41)
Albumin: 3.8 g/dL (ref 3.5–5.0)
Alkaline Phosphatase: 84 U/L (ref 38–126)
Anion gap: 9 (ref 5–15)
BUN: 25 mg/dL — ABNORMAL HIGH (ref 8–23)
CO2: 27 mmol/L (ref 22–32)
Calcium: 9.7 mg/dL (ref 8.9–10.3)
Chloride: 95 mmol/L — ABNORMAL LOW (ref 98–111)
Creatinine: 1.55 mg/dL — ABNORMAL HIGH (ref 0.61–1.24)
GFR, Estimated: 48 mL/min — ABNORMAL LOW (ref >60)
Glucose, Bld: 80 mg/dL (ref 70–99)
Potassium: 3.3 mmol/L — ABNORMAL LOW (ref 3.5–5.1)
Sodium: 131 mmol/L — ABNORMAL LOW (ref 135–145)
Total Bilirubin: 0.6 mg/dL (ref 0.3–1.2)
Total Protein: 7.7 g/dL (ref 6.5–8.1)

## 2023-03-09 LAB — CBC WITH DIFFERENTIAL (CANCER CENTER ONLY)
Abs Immature Granulocytes: 0.01 10*3/uL (ref 0.00–0.07)
Basophils Absolute: 0.1 10*3/uL (ref 0.0–0.1)
Basophils Relative: 1 %
Eosinophils Absolute: 0.3 10*3/uL (ref 0.0–0.5)
Eosinophils Relative: 4 %
HCT: 36.3 % — ABNORMAL LOW (ref 39.0–52.0)
Hemoglobin: 13.4 g/dL (ref 13.0–17.0)
Immature Granulocytes: 0 %
Lymphocytes Relative: 33 %
Lymphs Abs: 2.3 10*3/uL (ref 0.7–4.0)
MCH: 38.1 pg — ABNORMAL HIGH (ref 26.0–34.0)
MCHC: 36.9 g/dL — ABNORMAL HIGH (ref 30.0–36.0)
MCV: 103.1 fL — ABNORMAL HIGH (ref 80.0–100.0)
Monocytes Absolute: 0.6 10*3/uL (ref 0.1–1.0)
Monocytes Relative: 9 %
Neutro Abs: 3.8 10*3/uL (ref 1.7–7.7)
Neutrophils Relative %: 53 %
Platelet Count: 294 10*3/uL (ref 150–400)
RBC: 3.52 MIL/uL — ABNORMAL LOW (ref 4.22–5.81)
RDW: 12 % (ref 11.5–15.5)
WBC Count: 7.1 10*3/uL (ref 4.0–10.5)
nRBC: 0 % (ref 0.0–0.2)

## 2023-03-13 ENCOUNTER — Inpatient Hospital Stay: Payer: PPO | Admitting: Internal Medicine

## 2023-03-13 VITALS — BP 123/76 | HR 78 | Temp 98.1°F | Resp 18 | Wt 172.3 lb

## 2023-03-13 DIAGNOSIS — C3490 Malignant neoplasm of unspecified part of unspecified bronchus or lung: Secondary | ICD-10-CM | POA: Diagnosis not present

## 2023-03-13 DIAGNOSIS — N189 Chronic kidney disease, unspecified: Secondary | ICD-10-CM | POA: Diagnosis not present

## 2023-03-13 DIAGNOSIS — I129 Hypertensive chronic kidney disease with stage 1 through stage 4 chronic kidney disease, or unspecified chronic kidney disease: Secondary | ICD-10-CM | POA: Diagnosis not present

## 2023-03-13 DIAGNOSIS — Z79899 Other long term (current) drug therapy: Secondary | ICD-10-CM | POA: Diagnosis not present

## 2023-03-13 DIAGNOSIS — C3432 Malignant neoplasm of lower lobe, left bronchus or lung: Secondary | ICD-10-CM | POA: Diagnosis not present

## 2023-03-13 NOTE — Progress Notes (Signed)
[facility] [phone]   Fax:([phone]  OFFICE PROGRESS NOTE  Chris Retort, MD [address] [place] [address]  DIAGNOSIS: Stage IA (T1b, N0, M0) non-small cell lung cancer, adenocarcinoma.  The patient presented with a left lower lobe lung nodule.  She was diagnosed in February 2023.   PRIOR THERAPY: SBRT to the left lower lobe lung nodule under the care of Dr. Mitzi Hansen completed on November 25, 2021  CURRENT THERAPY: Observation  INTERVAL HISTORY: Chris Holloway 72 y.o. male returns to the clinic today for 6 months follow-up visit.  The patient is feeling fine today with no concerning complaints except for dry cough especially in the morning.  He denied having any current chest pain, shortness of breath except with exertion with no hemoptysis.  He has no nausea, vomiting, diarrhea or constipation.  He has no headache or visual changes.  He denied having any fever or chills.  He is here today for evaluation with repeat CT scan of the chest for restaging of his disease.  MEDICAL HISTORY: Past Medical History:  Diagnosis Date   Arthritis    hands back,    Chronic kidney disease    Per Dr. Tiburcio Pea but not with Dr. Berneice Heinrich   Hypertension    Stroke [facility]) 1998   mild stroke like symptoms lasted 5 min.    ALLERGIES:  has No Known Allergies.  MEDICATIONS:  Current Outpatient Medications  Medication Sig Dispense Refill   albuterol (VENTOLIN HFA) 108 (90 Base) MCG/ACT inhaler Inhale 2 puffs into the lungs every 6 (six) hours as needed for wheezing or shortness of breath. (Patient not taking: Reported on 03/09/2022)     ALPRAZolam (XANAX) 1 MG tablet Take 1.5 mg by mouth at bedtime.     Ascorbic Acid (VITAMIN C) 1000 MG tablet Take 1,000 mg by mouth daily.     aspirin EC 81 MG tablet Take 81 mg by mouth at bedtime. Swallow whole.     cetirizine (ZYRTEC) 10 MG tablet Take 10 mg by mouth daily.     chlorthalidone (HYGROTON) 25 MG tablet  Take 25 mg by mouth at bedtime.     cholecalciferol (VITAMIN D3) 25 MCG (1000 UNIT) tablet Take 1,000 Units by mouth daily.     gabapentin (NEURONTIN) 100 MG capsule Take 100 mg by mouth 2 (two) times daily as needed (pain).     guaifenesin (HUMIBID E) 400 MG TABS tablet Take 400 mg by mouth in the morning and at bedtime.     omega-3 acid ethyl esters (LOVAZA) 1 g capsule Take 3,000 mg by mouth at bedtime.     Polyvinyl Alcohol-Povidone (CLEAR EYES ALL SEASONS) 5-6 MG/ML SOLN Place 1 drop into both eyes daily as needed (Dry eyes).     pravastatin (PRAVACHOL) 40 MG tablet Take 40 mg by mouth at bedtime.     sildenafil (VIAGRA) 100 MG tablet Take 100 mg by mouth daily as needed for erectile dysfunction.     Turmeric 500 MG CAPS Take 500 mg by mouth in the morning and at bedtime.     valACYclovir (VALTREX) 500 MG tablet Take 500 mg by mouth daily.     zinc gluconate 50 MG tablet Take 50 mg by mouth at bedtime.     No current facility-administered medications for this visit.    SURGICAL HISTORY:  Past Surgical History:  Procedure Laterality Date   BRONCHIAL BIOPSY  10/19/2021   Procedure:  BRONCHIAL BIOPSIES;  Surgeon: Josephine Igo, DO;  [facility]: [facility];  Service: Pulmonary;;   BRONCHIAL NEEDLE ASPIRATION BIOPSY  10/19/2021   Procedure: BRONCHIAL NEEDLE ASPIRATION BIOPSIES;  Surgeon: Josephine Igo, DO;  [facility]: [facility];  Service: Pulmonary;;   FIDUCIAL MARKER PLACEMENT  10/19/2021   Procedure: FIDUCIAL MARKER PLACEMENT;  Surgeon: Josephine Igo, DO;  [facility]: [facility];  Service: Pulmonary;;   HAND LIGAMENT RECONSTRUCTION Right 1990s   Saw injury   ROBOTIC ASSITED PARTIAL NEPHRECTOMY Left 07/15/2020   Procedure: XI ROBOTIC ASSITED RADICAL NEPHRECTOMY WITH INTRAOPERAATIVE ULTRASOUND;  Surgeon: Sebastian Ache, MD;  Location: [facility];  Service: Urology;  Laterality: Left;  3 HRS   SHOULDER ARTHROSCOPY W/ ROTATOR CUFF REPAIR Left 08/2018   VIDEO BRONCHOSCOPY WITH RADIAL  ENDOBRONCHIAL ULTRASOUND  10/19/2021   Procedure: RADIAL ENDOBRONCHIAL ULTRASOUND;  Surgeon: Josephine Igo, DO;  [facility]: [facility];  Service: Pulmonary;;    REVIEW OF SYSTEMS:  A comprehensive review of systems was negative except for: Respiratory: positive for cough and dyspnea on exertion   PHYSICAL EXAMINATION: General appearance: alert, cooperative, fatigued, and no distress Head: Normocephalic, without obvious abnormality, atraumatic Neck: no adenopathy, no JVD, supple, symmetrical, trachea midline, and thyroid not enlarged, symmetric, no tenderness/mass/nodules Lymph nodes: Cervical, supraclavicular, and axillary nodes normal. Resp: clear to auscultation bilaterally Back: symmetric, no curvature. ROM normal. No CVA tenderness. Cardio: regular rate and rhythm, S1, S2 normal, no murmur, click, rub or gallop GI: soft, non-tender; bowel sounds normal; no masses,  no organomegaly Extremities: extremities normal, atraumatic, no cyanosis or edema  ECOG PERFORMANCE STATUS: 1 - Symptomatic but completely ambulatory  Blood pressure 123/76, pulse 78, temperature 98.1 F (36.7 C), temperature source Oral, resp. rate 18, weight 172 lb 5 oz (78.2 kg), SpO2 100%.  LABORATORY DATA: Lab Results  Component Value Date   WBC 7.1 03/09/2023   HGB 13.4 03/09/2023   HCT 36.3 (L) 03/09/2023   MCV 103.1 (H) 03/09/2023   PLT 294 03/09/2023      Chemistry      Component Value Date/Time   NA 131 (L) 03/09/2023 0940   K 3.3 (L) 03/09/2023 0940   CL 95 (L) 03/09/2023 0940   CO2 27 03/09/2023 0940   BUN 25 (H) 03/09/2023 0940   CREATININE 1.55 (H) 03/09/2023 0940      Component Value Date/Time   CALCIUM 9.7 03/09/2023 0940   ALKPHOS 84 03/09/2023 0940   AST 22 03/09/2023 0940   ALT 15 03/09/2023 0940   BILITOT 0.6 03/09/2023 0940       RADIOGRAPHIC STUDIES: No results found.  ASSESSMENT AND PLAN: This is a very pleasant 72 years old white male diagnosed with a stage Ia (T1b, N0,  M0) non-small cell lung cancer presented with left lower lobe lung nodule diagnosed in February 2023 status post SBRT to the left lower lobe lung nodule completed on November 25, 2021. The patient is currently on observation and he is feeling fine with no concerning complaints. He had repeat CT scan of the chest performed few days ago but the final report is still pending.  I personally and independently reviewed the scan images and discussed the result with the patient today.  I do not see any concerning findings for disease recurrence or metastasis but I will wait for the final report for confirmation. I recommended for the patient to continue on observation with repeat CT scan of the chest in 6 months. He was advised to call immediately if he  has any other concerning symptoms in the interval. The patient voices understanding of current disease status and treatment options and is in agreement with the current care plan.  All questions were answered. The patient knows to call the clinic with any problems, questions or concerns. We can certainly see the patient much sooner if necessary.  The total time spent in the appointment was 20 minutes.  Disclaimer: This note was dictated with voice recognition software. Similar sounding words can inadvertently be transcribed and may not be corrected upon review.

## 2023-04-23 IMAGING — CT CT CHEST SUPER D W/O CM
2 of 5 series · 15 of 36 positions shown, 18 images · non-contrast
Comparison: Chest CT 08/09/2021 and PET-CT 09/21/2021

CLINICAL DATA: Super D protocol for EMB. Left lower lobe pulmonary
nodules.

EXAM:
CT CHEST WITHOUT CONTRAST
TECHNIQUE: Multidetector CT imaging of the chest was performed using thin slice
collimation for electromagnetic bronchoscopy planning purposes,
without intravenous contrast.
RADIATION DOSE REDUCTION: This exam was performed according to the
departmental dose-optimization program which includes automated
exposure control, adjustment of the mA and/or kV according to
patient size and/or use of iterative reconstruction technique.

[Series 4: thins · axial · 0.75mm/px · z∈[-268,+24]mm · 12 of 423 slices shown, 15 images]
[im 29/423  mediastinal]
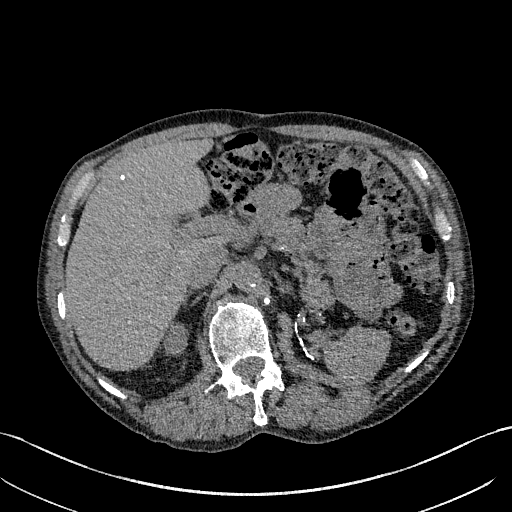
[im 29/423  lung]
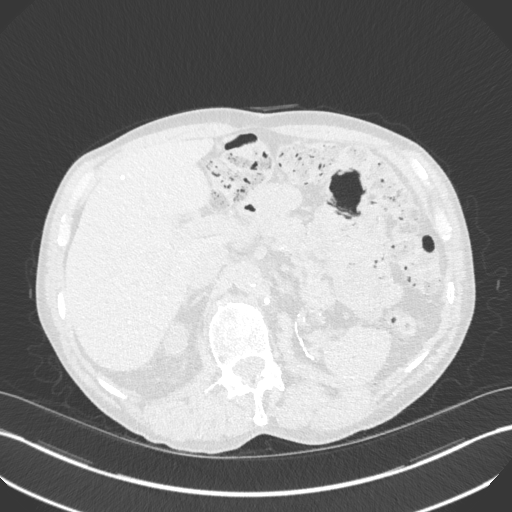
[im 57/423  lung]
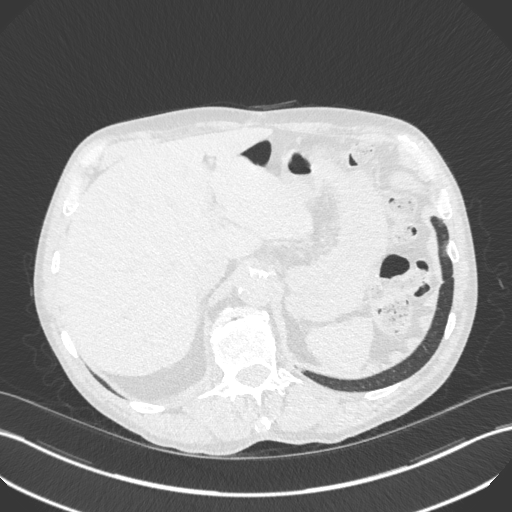
[im 85/423  lung]
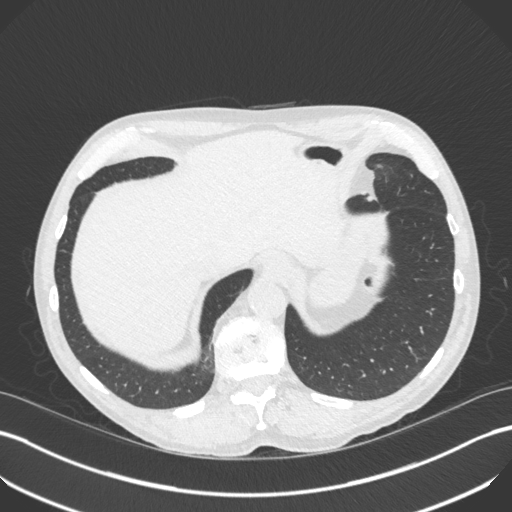
[im 141/423  lung]
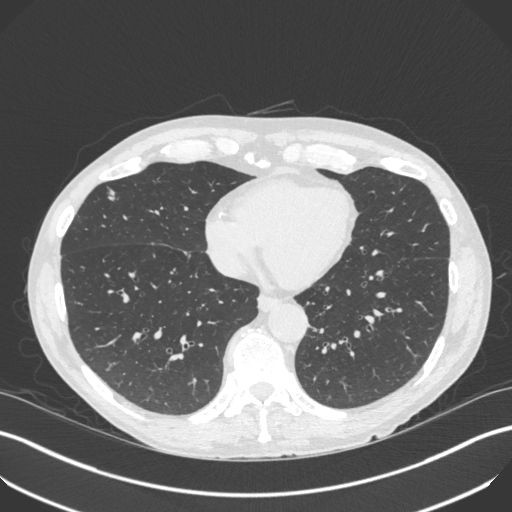
[im 169/423  mediastinal]
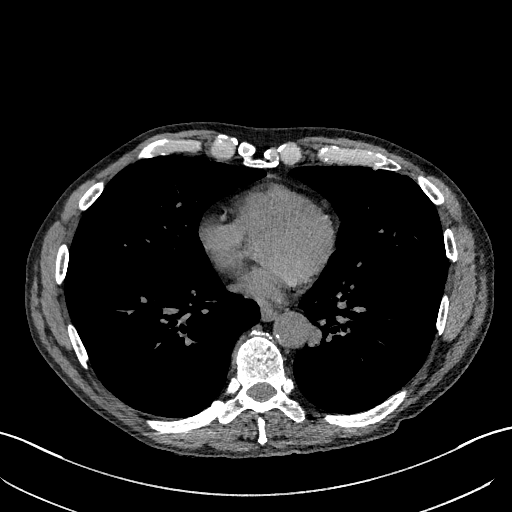
[im 169/423  lung]
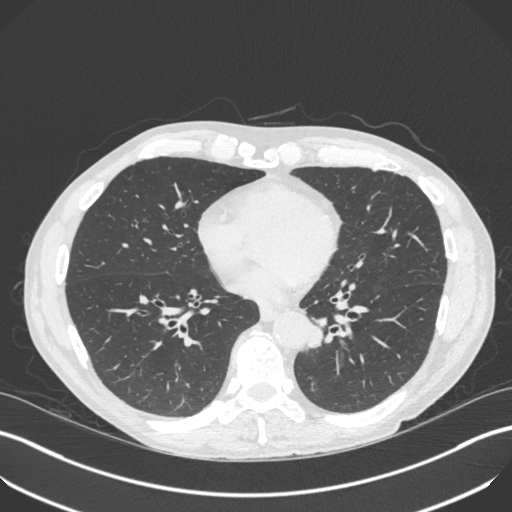
[im 197/423  lung]
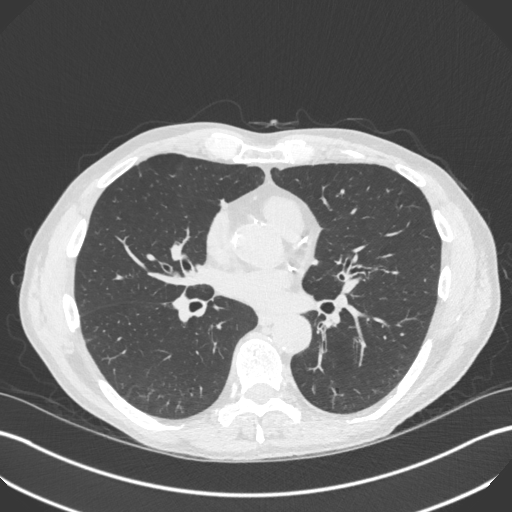
[im 226/423  lung]
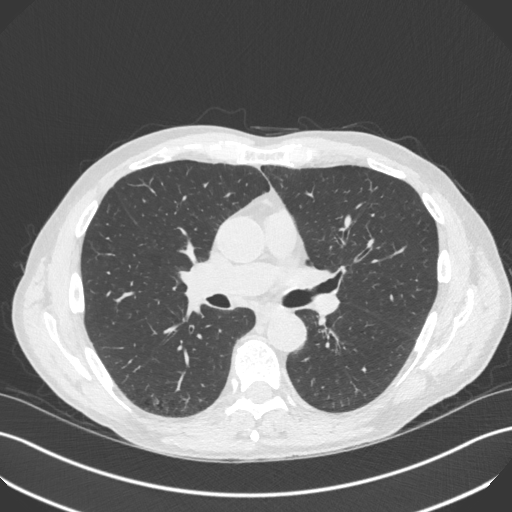
[im 254/423  lung]
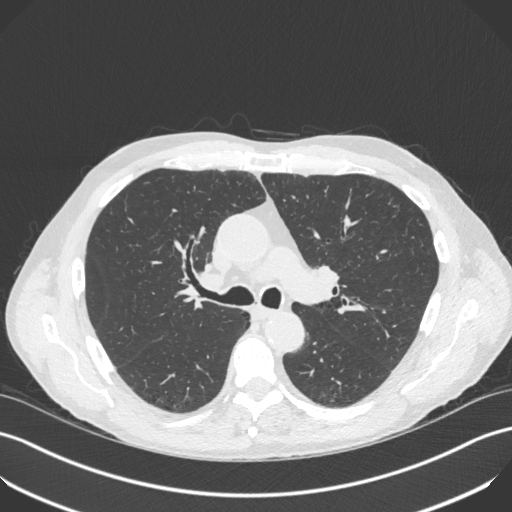
[im 282/423  mediastinal]
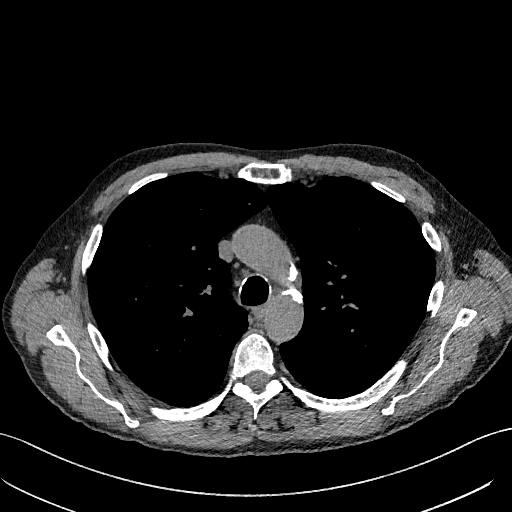
[im 282/423  lung]
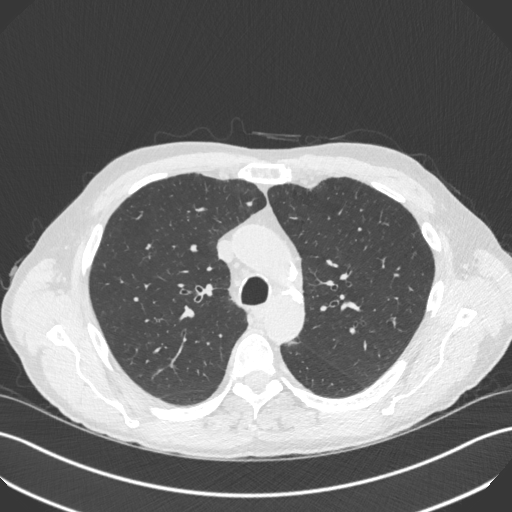
[im 338/423  lung]
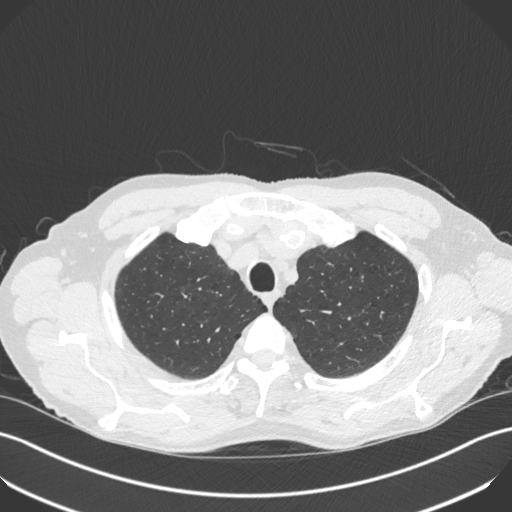
[im 366/423  lung]
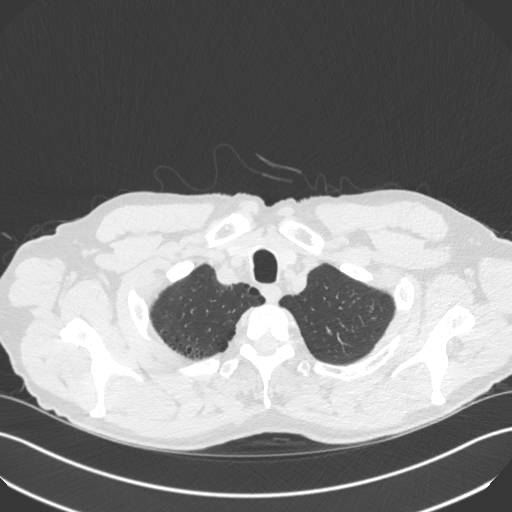
[im 394/423  lung]
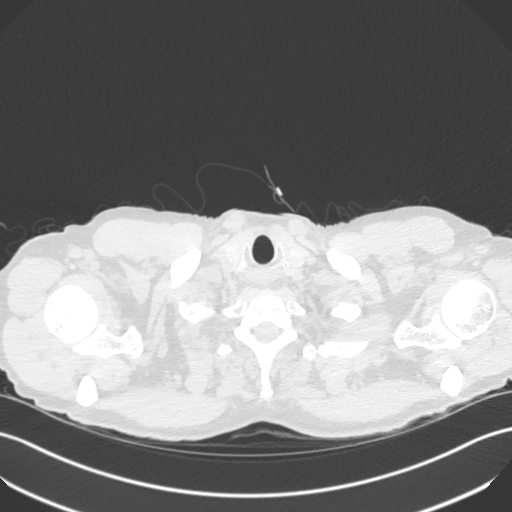

[Series 5: coronal · coronal · 0.66mm/px · 3 of 84 slices shown]
[im 17/84  lung]
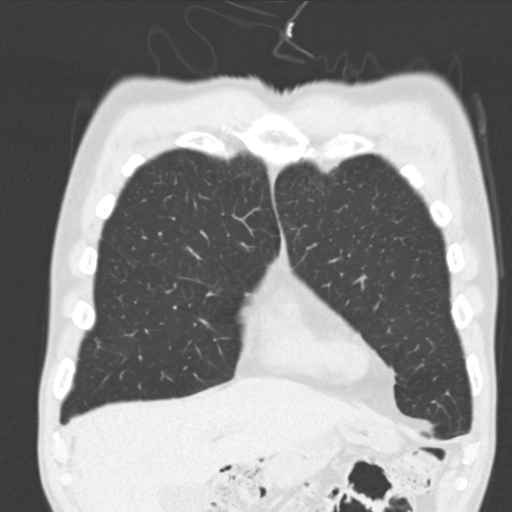
[im 34/84  lung]
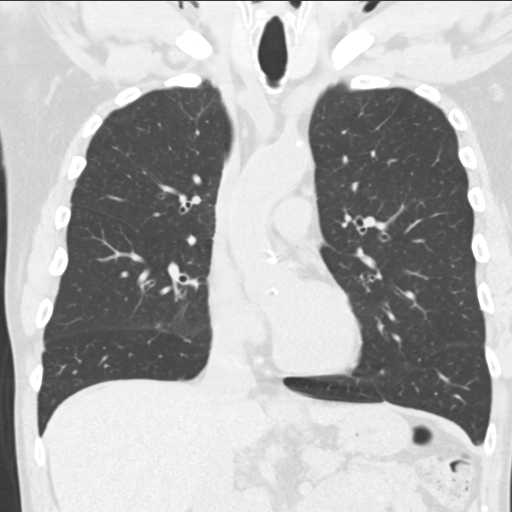
[im 50/84  lung]
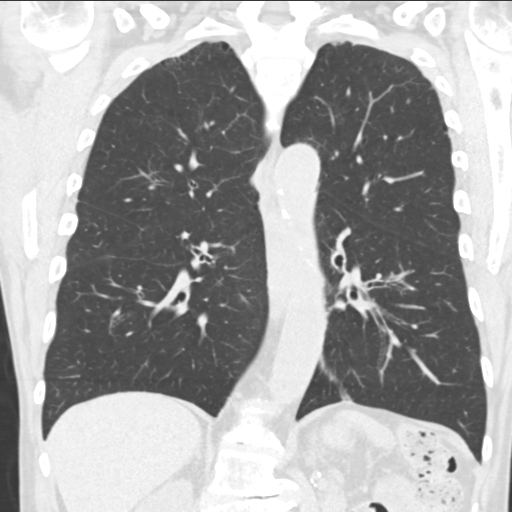

[15 of 36 positions shown; findings below may reference images not displayed]

FINDINGS: Cardiovascular: The heart is normal in size. No pericardial
effusion. There is mild tortuosity and moderate age advanced
atherosclerotic calcifications involving the thoracic aorta. Age
advanced three-vessel coronary artery calcifications again noted.
Calcifications also noted in the region of the aortic valve.

Mediastinum/Nodes: No mediastinal or hilar mass or lymphadenopathy.
The esophagus is grossly normal.

Lungs/Pleura: The left lower lobe pulmonary lesion adjacent to the
descending thoracic aorta is stable measuring approximately the
x 13.5 mm. This was hypermetabolic on the PET-CT. There also several
other smaller pulmonary nodules in the left lower lobe along with
small nodules in the right middle lobe and scattered subpleural
nodules in both lungs.

No acute overlying pulmonary process. No pleural effusions.

Upper Abdomen: Stable surgical changes from a left nephrectomy.
Stable calcified granuloma the liver. Age advanced vascular
calcifications are stable.

Musculoskeletal: No significant bony findings.
IMPRESSION: 1. Stable 16.5 x 13.5 mm left lower lobe pulmonary nodule adjacent
to the descending thoracic aorta.
2. Multiple other smaller pulmonary nodules are unchanged also.
3. No mediastinal or hilar mass or adenopathy.
4. Age advanced atherosclerotic calcifications involving the
thoracic and abdominal aorta and branch vessels including the
coronary arteries.
5. Stable surgical changes from a left nephrectomy.
6. Aortic atherosclerosis.

Aortic Atherosclerosis ([id number]).

## 2023-04-25 DIAGNOSIS — F5101 Primary insomnia: Secondary | ICD-10-CM | POA: Diagnosis not present

## 2023-04-25 DIAGNOSIS — L209 Atopic dermatitis, unspecified: Secondary | ICD-10-CM | POA: Diagnosis not present

## 2023-05-10 DIAGNOSIS — F419 Anxiety disorder, unspecified: Secondary | ICD-10-CM | POA: Diagnosis not present

## 2023-05-10 DIAGNOSIS — N183 Chronic kidney disease, stage 3 unspecified: Secondary | ICD-10-CM | POA: Diagnosis not present

## 2023-05-10 DIAGNOSIS — N529 Male erectile dysfunction, unspecified: Secondary | ICD-10-CM | POA: Diagnosis not present

## 2023-05-10 DIAGNOSIS — J439 Emphysema, unspecified: Secondary | ICD-10-CM | POA: Diagnosis not present

## 2023-05-10 DIAGNOSIS — I10 Essential (primary) hypertension: Secondary | ICD-10-CM | POA: Diagnosis not present

## 2023-05-10 DIAGNOSIS — Z0000 Encounter for general adult medical examination without abnormal findings: Secondary | ICD-10-CM | POA: Diagnosis not present

## 2023-05-10 DIAGNOSIS — C3490 Malignant neoplasm of unspecified part of unspecified bronchus or lung: Secondary | ICD-10-CM | POA: Diagnosis not present

## 2023-05-10 DIAGNOSIS — F17200 Nicotine dependence, unspecified, uncomplicated: Secondary | ICD-10-CM | POA: Diagnosis not present

## 2023-05-10 DIAGNOSIS — A6002 Herpesviral infection of other male genital organs: Secondary | ICD-10-CM | POA: Diagnosis not present

## 2023-05-10 DIAGNOSIS — E7800 Pure hypercholesterolemia, unspecified: Secondary | ICD-10-CM | POA: Diagnosis not present

## 2023-05-10 DIAGNOSIS — Z1211 Encounter for screening for malignant neoplasm of colon: Secondary | ICD-10-CM | POA: Diagnosis not present

## 2023-06-27 DIAGNOSIS — Z1211 Encounter for screening for malignant neoplasm of colon: Secondary | ICD-10-CM | POA: Diagnosis not present

## 2023-08-01 DIAGNOSIS — Z125 Encounter for screening for malignant neoplasm of prostate: Secondary | ICD-10-CM | POA: Diagnosis not present

## 2023-08-01 DIAGNOSIS — C642 Malignant neoplasm of left kidney, except renal pelvis: Secondary | ICD-10-CM | POA: Diagnosis not present

## 2023-08-02 DIAGNOSIS — J432 Centrilobular emphysema: Secondary | ICD-10-CM | POA: Diagnosis not present

## 2023-08-02 DIAGNOSIS — C3412 Malignant neoplasm of upper lobe, left bronchus or lung: Secondary | ICD-10-CM | POA: Diagnosis not present

## 2023-08-02 DIAGNOSIS — I700 Atherosclerosis of aorta: Secondary | ICD-10-CM | POA: Diagnosis not present

## 2023-08-02 DIAGNOSIS — C642 Malignant neoplasm of left kidney, except renal pelvis: Secondary | ICD-10-CM | POA: Diagnosis not present

## 2023-08-07 DIAGNOSIS — N183 Chronic kidney disease, stage 3 unspecified: Secondary | ICD-10-CM | POA: Diagnosis not present

## 2023-08-07 DIAGNOSIS — Z905 Acquired absence of kidney: Secondary | ICD-10-CM | POA: Diagnosis not present

## 2023-08-07 DIAGNOSIS — R911 Solitary pulmonary nodule: Secondary | ICD-10-CM | POA: Diagnosis not present

## 2023-08-07 DIAGNOSIS — C642 Malignant neoplasm of left kidney, except renal pelvis: Secondary | ICD-10-CM | POA: Diagnosis not present

## 2023-09-06 DIAGNOSIS — R059 Cough, unspecified: Secondary | ICD-10-CM | POA: Diagnosis not present

## 2023-09-07 ENCOUNTER — Inpatient Hospital Stay: Payer: PPO | Attending: Internal Medicine

## 2023-09-07 ENCOUNTER — Ambulatory Visit (HOSPITAL_COMMUNITY)
Admission: RE | Admit: 2023-09-07 | Discharge: 2023-09-07 | Disposition: A | Discharge status: Home / Self Care | Payer: PPO | Source: Ambulatory Visit | Attending: Internal Medicine | Admitting: Internal Medicine

## 2023-09-07 DIAGNOSIS — Z7982 Long term (current) use of aspirin: Secondary | ICD-10-CM | POA: Insufficient documentation

## 2023-09-07 DIAGNOSIS — C3490 Malignant neoplasm of unspecified part of unspecified bronchus or lung: Secondary | ICD-10-CM | POA: Diagnosis present

## 2023-09-07 DIAGNOSIS — Z79624 Long term (current) use of inhibitors of nucleotide synthesis: Secondary | ICD-10-CM | POA: Insufficient documentation

## 2023-09-07 DIAGNOSIS — Z85118 Personal history of other malignant neoplasm of bronchus and lung: Secondary | ICD-10-CM | POA: Diagnosis not present

## 2023-09-07 DIAGNOSIS — J189 Pneumonia, unspecified organism: Secondary | ICD-10-CM | POA: Insufficient documentation

## 2023-09-07 DIAGNOSIS — Z79899 Other long term (current) drug therapy: Secondary | ICD-10-CM | POA: Diagnosis not present

## 2023-09-07 LAB — CMP (CANCER CENTER ONLY)
ALT: 18 U/L (ref 0–44)
AST: 23 U/L (ref 15–41)
Albumin: 3.9 g/dL (ref 3.5–5.0)
Alkaline Phosphatase: 84 U/L (ref 38–126)
Anion gap: 6 (ref 5–15)
BUN: 21 mg/dL (ref 8–23)
CO2: 32 mmol/L (ref 22–32)
Calcium: 10 mg/dL (ref 8.9–10.3)
Chloride: 93 mmol/L — ABNORMAL LOW (ref 98–111)
Creatinine: 1.74 mg/dL — ABNORMAL HIGH (ref 0.61–1.24)
GFR, Estimated: 42 mL/min — ABNORMAL LOW (ref >60)
Glucose, Bld: 81 mg/dL (ref 70–99)
Potassium: 3.9 mmol/L (ref 3.5–5.1)
Sodium: 131 mmol/L — ABNORMAL LOW (ref 135–145)
Total Bilirubin: 0.6 mg/dL (ref 0.0–1.2)
Total Protein: 7.6 g/dL (ref 6.5–8.1)

## 2023-09-07 LAB — CBC WITH DIFFERENTIAL (CANCER CENTER ONLY)
Abs Immature Granulocytes: 0.01 10*3/uL (ref 0.00–0.07)
Basophils Absolute: 0.1 10*3/uL (ref 0.0–0.1)
Basophils Relative: 1 %
Eosinophils Absolute: 0.3 10*3/uL (ref 0.0–0.5)
Eosinophils Relative: 4 %
HCT: 39.6 % (ref 39.0–52.0)
Hemoglobin: 13.9 g/dL (ref 13.0–17.0)
Immature Granulocytes: 0 %
Lymphocytes Relative: 27 %
Lymphs Abs: 1.9 10*3/uL (ref 0.7–4.0)
MCH: 36.5 pg — ABNORMAL HIGH (ref 26.0–34.0)
MCHC: 35.1 g/dL (ref 30.0–36.0)
MCV: 103.9 fL — ABNORMAL HIGH (ref 80.0–100.0)
Monocytes Absolute: 0.6 10*3/uL (ref 0.1–1.0)
Monocytes Relative: 8 %
Neutro Abs: 4.2 10*3/uL (ref 1.7–7.7)
Neutrophils Relative %: 60 %
Platelet Count: 320 10*3/uL (ref 150–400)
RBC: 3.81 MIL/uL — ABNORMAL LOW (ref 4.22–5.81)
RDW: 12 % (ref 11.5–15.5)
WBC Count: 7 10*3/uL (ref 4.0–10.5)
nRBC: 0 % (ref 0.0–0.2)

## 2023-09-12 ENCOUNTER — Inpatient Hospital Stay: Payer: PPO | Admitting: Internal Medicine

## 2023-09-12 ENCOUNTER — Other Ambulatory Visit: Payer: Self-pay

## 2023-09-12 VITALS — BP 137/82 | HR 81 | Temp 97.3°F | Resp 16 | Ht 71.0 in | Wt 181.4 lb

## 2023-09-12 DIAGNOSIS — C3490 Malignant neoplasm of unspecified part of unspecified bronchus or lung: Secondary | ICD-10-CM | POA: Diagnosis not present

## 2023-09-12 DIAGNOSIS — Z85118 Personal history of other malignant neoplasm of bronchus and lung: Secondary | ICD-10-CM | POA: Diagnosis not present

## 2023-09-12 NOTE — Progress Notes (Signed)
 [facility] [phone]   [phone]  OFFICE PROGRESS NOTE  [name], MD [address] [place] [address]  DIAGNOSIS: Stage IA (T1b, N0, M0) non-small cell lung cancer, adenocarcinoma.  The patient presented with a left lower lobe lung nodule.  She was diagnosed in February 2023.   PRIOR THERAPY: SBRT to the left lower lobe lung nodule under the care of Dr. Dewey completed on November 25, 2021  CURRENT THERAPY: Observation  INTERVAL HISTORY: Chris Holloway 73 y.o. male returns to the clinic today for 44-month follow-up visit.Discussed the use of AI scribe software for clinical note transcription with the patient, who gave verbal consent to proceed.  History of Present Illness   Chris Holloway, a 73 year old patient with a history of stage 1A non-small cell lung cancer (adenocarcinoma), was treated with SPRT to the left lower lobe lung nodule in February 2023. The patient has been under observation since then.  In the past six months, the patient developed double pneumonia, which has been ongoing for about eight to nine days. The patient sought treatment from a family doctor and was prescribed Z-Pak and Amoxicillin (Augmentin). The patient completed a five-day course of Z-Pak and is currently on day seven of a ten-day course of Amoxicillin.  The symptoms of pneumonia included weakness, fatigue, and a cough producing yellowish phlegm. The patient denied having a fever, chills, or hemoptysis. There was a slight weight loss of about a pound within the past week.  The patient had a scan done last week, which showed an increase in size of some of the lung nodules, especially on the left side. These nodules have been present for some time and are not new developments associated with the recent infection.        MEDICAL HISTORY: Past Medical History:  Diagnosis Date   Arthritis    hands back,    Chronic kidney disease     Per Dr. Arloa but not with Dr. Alvaro   Hypertension    Stroke [facility]) 1998   mild stroke like symptoms lasted 5 min.    ALLERGIES:  has no known allergies.  MEDICATIONS:  Current Outpatient Medications  Medication Sig Dispense Refill   albuterol (VENTOLIN HFA) 108 (90 Base) MCG/ACT inhaler Inhale 2 puffs into the lungs every 6 (six) hours as needed for wheezing or shortness of breath. (Patient not taking: Reported on 03/09/2022)     ALPRAZolam  (XANAX ) 1 MG tablet Take 1.5 mg by mouth at bedtime.     Ascorbic Acid (VITAMIN C) 1000 MG tablet Take 1,000 mg by mouth daily.     aspirin EC 81 MG tablet Take 81 mg by mouth at bedtime. Swallow whole.     cetirizine (ZYRTEC) 10 MG tablet Take 10 mg by mouth daily.     chlorthalidone (HYGROTON) 25 MG tablet Take 25 mg by mouth at bedtime.     cholecalciferol (VITAMIN D3) 25 MCG (1000 UNIT) tablet Take 1,000 Units by mouth daily.     gabapentin  (NEURONTIN ) 100 MG capsule Take 100 mg by mouth 2 (two) times daily as needed (pain).     guaifenesin (HUMIBID E) 400 MG TABS tablet Take 400 mg by mouth in the morning and at bedtime.     omega-3 acid ethyl esters (LOVAZA) 1 g capsule Take 3,000 mg by mouth at bedtime.     Polyvinyl Alcohol-Povidone (CLEAR EYES ALL SEASONS) 5-6 MG/ML SOLN Place 1 drop  into both eyes daily as needed (Dry eyes).     pravastatin (PRAVACHOL) 40 MG tablet Take 40 mg by mouth at bedtime.     sildenafil (VIAGRA) 100 MG tablet Take 100 mg by mouth daily as needed for erectile dysfunction.     Turmeric 500 MG CAPS Take 500 mg by mouth in the morning and at bedtime.     valACYclovir (VALTREX) 500 MG tablet Take 500 mg by mouth daily.     zinc gluconate 50 MG tablet Take 50 mg by mouth at bedtime.     No current facility-administered medications for this visit.    SURGICAL HISTORY:  Past Surgical History:  Procedure Laterality Date   BRONCHIAL BIOPSY  10/19/2021   Procedure: BRONCHIAL BIOPSIES;  Surgeon: Brenna Adine CROME, DO;   Location: [facility];  Service: Pulmonary;;   BRONCHIAL NEEDLE ASPIRATION BIOPSY  10/19/2021   Procedure: BRONCHIAL NEEDLE ASPIRATION BIOPSIES;  Surgeon: Brenna Adine CROME, DO;  Location: [facility];  Service: Pulmonary;;   FIDUCIAL MARKER PLACEMENT  10/19/2021   Procedure: FIDUCIAL MARKER PLACEMENT;  Surgeon: Brenna Adine CROME, DO;  Location: [facility];  Service: Pulmonary;;   HAND LIGAMENT RECONSTRUCTION Right 1990s   Saw injury   ROBOTIC ASSITED PARTIAL NEPHRECTOMY Left 07/15/2020   Procedure: XI ROBOTIC ASSITED RADICAL NEPHRECTOMY WITH INTRAOPERAATIVE ULTRASOUND;  Surgeon: Alvaro Hummer, MD;  Location: [facility];  Service: Urology;  Laterality: Left;  3 HRS   SHOULDER ARTHROSCOPY W/ ROTATOR CUFF REPAIR Left 08/2018   VIDEO BRONCHOSCOPY WITH RADIAL ENDOBRONCHIAL ULTRASOUND  10/19/2021   Procedure: RADIAL ENDOBRONCHIAL ULTRASOUND;  Surgeon: Brenna Adine CROME, DO;  Location: [facility];  Service: Pulmonary;;    REVIEW OF SYSTEMS:  Constitutional: positive for fatigue Eyes: negative Ears, nose, mouth, throat, and face: negative Respiratory: positive for cough and dyspnea on exertion Cardiovascular: negative Gastrointestinal: negative Genitourinary:negative Integument/breast: negative Hematologic/lymphatic: negative Musculoskeletal:negative Neurological: negative Behavioral/Psych: negative Endocrine: negative Allergic/Immunologic: negative   PHYSICAL EXAMINATION: General appearance: alert, cooperative, fatigued, and no distress Head: Normocephalic, without obvious abnormality, atraumatic Neck: no adenopathy, no JVD, supple, symmetrical, trachea midline, and thyroid  not enlarged, symmetric, no tenderness/mass/nodules Lymph nodes: Cervical, supraclavicular, and axillary nodes normal. Resp: clear to auscultation bilaterally Back: symmetric, no curvature. ROM normal. No CVA tenderness. Cardio: regular rate and rhythm, S1, S2 normal, no murmur, click, rub or gallop GI: soft,  non-tender; bowel sounds normal; no masses,  no organomegaly Extremities: extremities normal, atraumatic, no cyanosis or edema Neurologic: Alert and oriented X 3, normal strength and tone. Normal symmetric reflexes. Normal coordination and gait  ECOG PERFORMANCE STATUS: 1 - Symptomatic but completely ambulatory  Blood pressure 137/82, pulse 81, temperature (!) 97.3 F (36.3 C), temperature source Temporal, resp. rate 16, height 5' 11 (1.803 m), weight 181 lb 6.4 oz (82.3 kg), SpO2 99%.  LABORATORY DATA: Lab Results  Component Value Date   WBC 7.0 09/07/2023   HGB 13.9 09/07/2023   HCT 39.6 09/07/2023   MCV 103.9 (H) 09/07/2023   PLT 320 09/07/2023      Chemistry      Component Value Date/Time   NA 131 (L) 09/07/2023 0904   K 3.9 09/07/2023 0904   CL 93 (L) 09/07/2023 0904   CO2 32 09/07/2023 0904   BUN 21 09/07/2023 0904   CREATININE 1.74 (H) 09/07/2023 0904      Component Value Date/Time   CALCIUM 10.0 09/07/2023 0904   ALKPHOS 84 09/07/2023 0904   AST 23 09/07/2023 0904   ALT 18 09/07/2023 0904  BILITOT 0.6 09/07/2023 9095       RADIOGRAPHIC STUDIES: CT Chest Wo Contrast Result Date: 09/12/2023 CLINICAL DATA:  Non-small cell lung cancer, for restaging EXAM: CT CHEST WITHOUT CONTRAST TECHNIQUE: Multidetector CT imaging of the chest was performed following the standard protocol without IV contrast. RADIATION DOSE REDUCTION: This exam was performed according to the departmental dose-optimization program which includes automated exposure control, adjustment of the mA and/or kV according to patient size and/or use of iterative reconstruction technique. COMPARISON:  03/09/2023 FINDINGS: Cardiovascular: The heart is normal in size. No pericardial effusion. No evidence of thoracic aortic aneurysm. Atherosclerotic calcifications of the aortic arch. Moderate three-vessel coronary atherosclerosis. Mediastinum/Nodes: No suspicious mediastinal lymphadenopathy. Visualized thyroid  is  unremarkable. Lungs/Pleura: Fiducial marker in the central left lower lobe/perihilar region with radiation changes. Superimposed left lung nodularity is progressive, including: --11 x 9 mm nodule in the medial left upper lobe (series 6/image 97), previously 8 x 5 mm --2.0 x 1.0 cm subpleural nodule in the anterior left lower lobe (series 6/image 111), previously 1.8 x 0.7 cm --2.3 x 1.1 cm irregular nodule in the medial left lower lobe (series 6/image 118), previously 2.1 x 0.8 cm --1.7 x 1.0 cm aggregate nodule in the central left lower lobe (series 6/image 125), previously two discrete adjacent nodules measuring 9 mm each Mild peribronchovascular nodularity in the right middle lobe measuring up to 7 mm (series 6/image 106), overall similar when compared to the prior, possibly reflecting post infectious inflammatory scarring. Mild centrilobular and paraseptal emphysematous changes upper lung predominant. No pleural effusion or pneumothorax. Upper Abdomen: Visualized upper abdomen is notable for prior left nephrectomy and vascular calcifications. Musculoskeletal: Visualized osseous structures are within normal limits. IMPRESSION: Radiation changes in the left lower lobe/infrahilar region. Progressive left lung nodularity, as above, suspicious for recurrent/metastatic disease. Aortic Atherosclerosis (ICD10-I70.0) and Emphysema (ICD10-J43.9). Electronically Signed   By: Pinkie Pebbles M.D.   On: 09/12/2023 00:08    ASSESSMENT AND PLAN: This is a very pleasant 73 years old white male diagnosed with a stage Ia (T1b, N0, M0) non-small cell lung cancer presented with left lower lobe lung nodule diagnosed in February 2023 status post SBRT to the left lower lobe lung nodule completed on November 25, 2021. He has been in observation since that time. He had repeat CT scan of the chest performed recently.  Unfortunately his scan showed progressive left lung nodularities suspicious for recurrent/metastatic disease.      Stage 1A Non-Small Cell Lung Cancer (Adenocarcinoma) Diagnosed in February 2023. Received SPRT radiation to left lower lobe lung nodule. Recent scan shows increased size of nodules in both lungs. Differential diagnosis includes cancer recurrence versus inflammation from recent pneumonia. PET scan recommended to assess metabolic activity. Discussed biopsy or additional radiation if nodules are active. Patient agreed to PET scan. - Order PET scan - Follow-up in three weeks to review PET scan results  Pneumonia Reports double pneumonia for 8-9 days, treated with Augmentin 875 mg and Z-Pak. Symptoms included weakness, fatigue, and yellowish phlegm. No fever, chills, or hemoptysis. Currently asymptomatic and completing amoxicillin course. - Complete amoxicillin course - Follow up with family doctor for pneumonia management.   He was advised to call immediately if he has any other concerning symptoms in the interval.   The patient voices understanding of current disease status and treatment options and is in agreement with the current care plan.  All questions were answered. The patient knows to call the clinic with any problems, questions or concerns. We  can certainly see the patient much sooner if necessary.  The total time spent in the appointment was 30 minutes.  Disclaimer: This note was dictated with voice recognition software. Similar sounding words can inadvertently be transcribed and may not be corrected upon review.

## 2023-09-13 ENCOUNTER — Telehealth: Payer: Self-pay | Admitting: Internal Medicine

## 2023-09-21 ENCOUNTER — Encounter (HOSPITAL_COMMUNITY)
Admission: RE | Admit: 2023-09-21 | Discharge: 2023-09-21 | Disposition: A | Discharge status: Home / Self Care | Payer: PPO | Source: Ambulatory Visit | Attending: Internal Medicine | Admitting: Internal Medicine

## 2023-09-21 DIAGNOSIS — C3490 Malignant neoplasm of unspecified part of unspecified bronchus or lung: Secondary | ICD-10-CM | POA: Insufficient documentation

## 2023-09-21 LAB — GLUCOSE, CAPILLARY: Glucose-Capillary: 102 mg/dL — ABNORMAL HIGH (ref 70–99)

## 2023-09-21 MED ORDER — FLUDEOXYGLUCOSE F - 18 (FDG) INJECTION
8.0000 | Freq: Once | INTRAVENOUS | Status: AC | PRN
  Administered 2023-09-21: 9.04 via INTRAVENOUS

## 2023-10-05 ENCOUNTER — Inpatient Hospital Stay: Payer: PPO | Attending: Internal Medicine | Admitting: Internal Medicine

## 2023-10-05 VITALS — BP 120/76 | HR 85 | Temp 97.3°F | Resp 16 | Ht 71.0 in | Wt 182.9 lb

## 2023-10-05 DIAGNOSIS — C3490 Malignant neoplasm of unspecified part of unspecified bronchus or lung: Secondary | ICD-10-CM

## 2023-10-05 DIAGNOSIS — Z85118 Personal history of other malignant neoplasm of bronchus and lung: Secondary | ICD-10-CM | POA: Insufficient documentation

## 2023-10-05 DIAGNOSIS — Z923 Personal history of irradiation: Secondary | ICD-10-CM | POA: Insufficient documentation

## 2023-10-05 NOTE — Progress Notes (Signed)
 [facility] [phone]   [phone]  OFFICE PROGRESS NOTE  [name], MD [address] [place] [address]  DIAGNOSIS:  1) new hypermetabolic left lower lobe lung nodule suspicious for disease recurrence diagnosed in January 2025. 2)Stage IA (T1b, N0, M0) non-small cell lung cancer, adenocarcinoma.  The patient presented with a left lower lobe lung nodule.  She was diagnosed in February 2023.   PRIOR THERAPY: SBRT to the left lower lobe lung nodule under the care of Dr. Dewey completed on November 25, 2021  CURRENT THERAPY: Referral to Dr. Dewey for consideration of repeat SBRT to the new nodule.  INTERVAL HISTORY: Chris Holloway 73 y.o. male returns to the clinic today for 11-month follow-up visit.Discussed the use of AI scribe software for clinical note transcription with the patient, who gave verbal consent to proceed.  History of Present Illness   Chris Holloway is a 73 year old male with stage 1A non-small cell lung cancer, adenocarcinoma, who presents with a new suspicious lung nodule.  He was diagnosed with stage 1A non-small cell lung cancer, adenocarcinoma, in February 2023. Since then, he has been under surveillance after receiving radiation treatment with SBRT to a nodule in the left lower lobe of the lung. He recalls receiving eight radiation treatments initially because the nodule was close to the middle of the lung. Although he does not remember undergoing a biopsy, records indicate a bronchoscopy was performed in February 2023, which confirmed the lung cancer diagnosis.  A recent scan in January 2025 showed a new suspicious spot in the left lower lobe, raising concerns about cancer recurrence. A subsequent PET scan indicated activity in this new area, which 'lit up' on the scan, suggesting potential cancer development.  He has been coughing up a lot of mucus, particularly in the mornings, which then  subsides. He uses Mucinex and warm liquids to manage this symptom. No breathing problems and has not been seeing a pulmonologist.           MEDICAL HISTORY: Past Medical History:  Diagnosis Date   Arthritis    hands back,    Chronic kidney disease    Per Dr. Arloa but not with Dr. Alvaro   Hypertension    Stroke [facility]) 1998   mild stroke like symptoms lasted 5 min.    ALLERGIES:  has no known allergies.  MEDICATIONS:  Current Outpatient Medications  Medication Sig Dispense Refill   ALPRAZolam  (XANAX ) 1 MG tablet Take 1.5 mg by mouth at bedtime.     Ascorbic Acid (VITAMIN C) 1000 MG tablet Take 1,000 mg by mouth daily.     aspirin EC 81 MG tablet Take 81 mg by mouth at bedtime. Swallow whole.     cetirizine (ZYRTEC) 10 MG tablet Take 10 mg by mouth daily.     chlorthalidone (HYGROTON) 25 MG tablet Take 25 mg by mouth at bedtime.     guaifenesin (HUMIBID E) 400 MG TABS tablet Take 400 mg by mouth in the morning and at bedtime.     Polyvinyl Alcohol-Povidone (CLEAR EYES ALL SEASONS) 5-6 MG/ML SOLN Place 1 drop into both eyes daily as needed (Dry eyes).     pravastatin (PRAVACHOL) 40 MG tablet Take 40 mg by mouth at bedtime.     sildenafil (VIAGRA) 100 MG tablet Take 100 mg by mouth daily as needed for erectile dysfunction.     valACYclovir (VALTREX) 500 MG  tablet Take 500 mg by mouth daily.     No current facility-administered medications for this visit.    SURGICAL HISTORY:  Past Surgical History:  Procedure Laterality Date   BRONCHIAL BIOPSY  10/19/2021   Procedure: BRONCHIAL BIOPSIES;  Surgeon: Brenna Adine CROME, DO;  Location: [facility];  Service: Pulmonary;;   BRONCHIAL NEEDLE ASPIRATION BIOPSY  10/19/2021   Procedure: BRONCHIAL NEEDLE ASPIRATION BIOPSIES;  Surgeon: Brenna Adine CROME, DO;  Location: [facility];  Service: Pulmonary;;   FIDUCIAL MARKER PLACEMENT  10/19/2021   Procedure: FIDUCIAL MARKER PLACEMENT;  Surgeon: Brenna Adine CROME, DO;  Location: [facility];   Service: Pulmonary;;   HAND LIGAMENT RECONSTRUCTION Right 1990s   Saw injury   ROBOTIC ASSITED PARTIAL NEPHRECTOMY Left 07/15/2020   Procedure: XI ROBOTIC ASSITED RADICAL NEPHRECTOMY WITH INTRAOPERAATIVE ULTRASOUND;  Surgeon: Alvaro Hummer, MD;  Location: [facility];  Service: Urology;  Laterality: Left;  3 HRS   SHOULDER ARTHROSCOPY W/ ROTATOR CUFF REPAIR Left 08/2018   VIDEO BRONCHOSCOPY WITH RADIAL ENDOBRONCHIAL ULTRASOUND  10/19/2021   Procedure: RADIAL ENDOBRONCHIAL ULTRASOUND;  Surgeon: Brenna Adine CROME, DO;  Location: [facility];  Service: Pulmonary;;    REVIEW OF SYSTEMS:  Constitutional: positive for fatigue Eyes: negative Ears, nose, mouth, throat, and face: negative Respiratory: positive for cough and sputum Cardiovascular: negative Gastrointestinal: negative Genitourinary:negative Integument/breast: negative Hematologic/lymphatic: negative Musculoskeletal:negative Neurological: negative Behavioral/Psych: negative Endocrine: negative Allergic/Immunologic: negative   PHYSICAL EXAMINATION: General appearance: alert, cooperative, fatigued, and no distress Head: Normocephalic, without obvious abnormality, atraumatic Neck: no adenopathy, no JVD, supple, symmetrical, trachea midline, and thyroid  not enlarged, symmetric, no tenderness/mass/nodules Lymph nodes: Cervical, supraclavicular, and axillary nodes normal. Resp: clear to auscultation bilaterally Back: symmetric, no curvature. ROM normal. No CVA tenderness. Cardio: regular rate and rhythm, S1, S2 normal, no murmur, click, rub or gallop GI: soft, non-tender; bowel sounds normal; no masses,  no organomegaly Extremities: extremities normal, atraumatic, no cyanosis or edema Neurologic: Alert and oriented X 3, normal strength and tone. Normal symmetric reflexes. Normal coordination and gait  ECOG PERFORMANCE STATUS: 1 - Symptomatic but completely ambulatory  Blood pressure 120/76, pulse 85, temperature (!) 97.3 F (36.3 C),  temperature source Temporal, resp. rate 16, height 5' 11 (1.803 m), weight 182 lb 14.4 oz (83 kg), SpO2 97%.  LABORATORY DATA: Lab Results  Component Value Date   WBC 7.0 09/07/2023   HGB 13.9 09/07/2023   HCT 39.6 09/07/2023   MCV 103.9 (H) 09/07/2023   PLT 320 09/07/2023      Chemistry      Component Value Date/Time   NA 131 (L) 09/07/2023 0904   K 3.9 09/07/2023 0904   CL 93 (L) 09/07/2023 0904   CO2 32 09/07/2023 0904   BUN 21 09/07/2023 0904   CREATININE 1.74 (H) 09/07/2023 0904      Component Value Date/Time   CALCIUM 10.0 09/07/2023 0904   ALKPHOS 84 09/07/2023 0904   AST 23 09/07/2023 0904   ALT 18 09/07/2023 0904   BILITOT 0.6 09/07/2023 0904       RADIOGRAPHIC STUDIES: NM PET Image Restage (PS) Skull Base to Thigh (F-18 FDG) Result Date: 09/30/2023 CLINICAL DATA:  Subsequent treatment strategy for non-small cell lung cancer. EXAM: NUCLEAR MEDICINE PET SKULL BASE TO THIGH TECHNIQUE: 9.0 mCi F-18 FDG was injected intravenously. Full-ring PET imaging was performed from the skull base to thigh after the radiotracer. CT data was obtained and used for attenuation correction and anatomic localization. Fasting blood glucose: 102 mg/dl COMPARISON:  98/75/7976, and  also chest CT from 09/07/2023 FINDINGS: Mediastinal blood pool activity: SUV max 2.0 Liver activity: SUV max NA NECK: Symmetric salivary and lymphoid tissue activity similar to the prior exam and considered benign/physiologic. Incidental CT findings: Small mucous retention cyst in the left maxillary sinus. Bilateral common carotid atheromatous vascular calcifications. CHEST: Volume loss and therapy related findings along with a fiducial in the vicinity of the medial left lower lobe nodule adjacent to the descending thoracic aorta, current maximum SUV 2.9, previous SUV 7.0. A 2.0 by 0.9 cm nodule anteriorly in the lingula on image 98 of series 4 has a maximum SUV of 2.5. This previously measured 1.0 by 0.4 cm with a  previous maximum SUV of 1.6 on 09/21/2021. The progression is compatible with malignancy. Clustered ground-glass density and branching nodularity in the right middle lobe on image 99 series 4 is observed with maximum SUV 1.5. Favor atypical infectious process based on morphology, although technically nonspecific. The progressive subpleural nodularity in the left lung especially along the left lower lobe is not currently substantially hypermetabolic although much of this is below sensitive PET-CT size thresholds. Much of this is progressive compared to 09/21/2021. Incidental CT findings: Coronary, aortic arch, and branch vessel atherosclerotic vascular disease. ABDOMEN/PELVIS: No significant abnormal hypermetabolic activity in this region. Incidental CT findings: Left nephrectomy. Atherosclerosis is present, including aortoiliac atherosclerotic disease. Prominent stool throughout the colon favors constipation. Mildly retracted left testicle. SKELETON: No significant abnormal hypermetabolic activity in this region. Incidental CT findings: Dextroconvex lumbar scoliosis. IMPRESSION: 1. Therapy related findings including volume loss along the medial left lower lobe nodule adjacent to the descending thoracic aorta which has decreased in metabolic activity, currently maximum SUV 2.9, previous SUV 7.0. 2. A 2.0 by 0.9 cm nodule anteriorly in the lingula has a maximum SUV of 2.5, previously smaller and with SUV 1.6 on 09/21/2021. The progression is compatible with malignancy. 3. The other progressive subpleural nodularity in the left lung especially along the left lower lobe is not currently substantially hypermetabolic although much of this is below sensitive PET-CT size thresholds. 4. Clustered ground-glass density and branching nodularity in the right middle lobe is observed with maximum SUV 1.5. Favor atypical infectious process based on morphology, although technically nonspecific. 5. Prominent stool throughout the  colon favors constipation. 6. Dextroconvex lumbar scoliosis. 7. Aortic, coronary, and systemic atherosclerosis. Electronically Signed   By: Ryan Salvage M.D.   On: 09/30/2023 15:51   CT Chest Wo Contrast Result Date: 09/12/2023 CLINICAL DATA:  Non-small cell lung cancer, for restaging EXAM: CT CHEST WITHOUT CONTRAST TECHNIQUE: Multidetector CT imaging of the chest was performed following the standard protocol without IV contrast. RADIATION DOSE REDUCTION: This exam was performed according to the departmental dose-optimization program which includes automated exposure control, adjustment of the mA and/or kV according to patient size and/or use of iterative reconstruction technique. COMPARISON:  03/09/2023 FINDINGS: Cardiovascular: The heart is normal in size. No pericardial effusion. No evidence of thoracic aortic aneurysm. Atherosclerotic calcifications of the aortic arch. Moderate three-vessel coronary atherosclerosis. Mediastinum/Nodes: No suspicious mediastinal lymphadenopathy. Visualized thyroid  is unremarkable. Lungs/Pleura: Fiducial marker in the central left lower lobe/perihilar region with radiation changes. Superimposed left lung nodularity is progressive, including: --11 x 9 mm nodule in the medial left upper lobe (series 6/image 97), previously 8 x 5 mm --2.0 x 1.0 cm subpleural nodule in the anterior left lower lobe (series 6/image 111), previously 1.8 x 0.7 cm --2.3 x 1.1 cm irregular nodule in the medial left lower lobe (series 6/image  118), previously 2.1 x 0.8 cm --1.7 x 1.0 cm aggregate nodule in the central left lower lobe (series 6/image 125), previously two discrete adjacent nodules measuring 9 mm each Mild peribronchovascular nodularity in the right middle lobe measuring up to 7 mm (series 6/image 106), overall similar when compared to the prior, possibly reflecting post infectious inflammatory scarring. Mild centrilobular and paraseptal emphysematous changes upper lung predominant. No  pleural effusion or pneumothorax. Upper Abdomen: Visualized upper abdomen is notable for prior left nephrectomy and vascular calcifications. Musculoskeletal: Visualized osseous structures are within normal limits. IMPRESSION: Radiation changes in the left lower lobe/infrahilar region. Progressive left lung nodularity, as above, suspicious for recurrent/metastatic disease. Aortic Atherosclerosis (ICD10-I70.0) and Emphysema (ICD10-J43.9). Electronically Signed   By: Pinkie Pebbles M.D.   On: 09/12/2023 00:08    ASSESSMENT AND PLAN: This is a very pleasant 73 years old white male diagnosed with a stage Ia (T1b, N0, M0) non-small cell lung cancer presented with left lower lobe lung nodule diagnosed in February 2023 status post SBRT to the left lower lobe lung nodule completed on November 25, 2021. He has been in observation since that time. He had repeat CT scan of the chest performed recently.  Unfortunately his scan showed progressive left lung nodularities suspicious for recurrent/metastatic disease. This was followed by a PET scan and it showed a 2.0 x 0.9 cm nodule anteriorly in the lingula with maximum SUV of 2.5 and previously had SUV of 1.6 and suspicious for malignancy.    Recurrent Non-Small Cell Lung Cancer (NSCLC) Recurrent NSCLC, adenocarcinoma subtype, initially diagnosed in February 2023 as stage 1A. Previously received SBRT to a left lower lobe lung nodule with good response. Recent PET scan in January 2025 revealed a new suspicious and active spot in the left lower lobe, indicating potential recurrence. The new lesion is small and in a different area from the original nodule. Discussed treatment options including biopsy for confirmation or direct radiation treatment. Patient prefers to proceed with radiation treatment similar to previous therapy. Explained that the radiation treatment (SBRT) would be similar to the previous treatment, and is expected to be effective as it was for the initial  nodule. Patient understands the risks and benefits and consents to proceed with radiation. - Refer to Dr. Dewey for SBRT to the new left lower lobe lesion - Schedule follow-up appointment in four months to assess response to treatment  Chronic Cough with Mucus Production Chronic cough with significant mucus production, particularly in the mornings. Reports using Mucinex and warm liquids, which provide some relief. No current issues with breathing. Discussed the potential benefit of seeing a pulmonologist for further evaluation and management, but patient declined as he does not feel it is necessary at this time. - Continue Mucinex and warm liquids for mucus management - Consider pulmonology referral if symptoms worsen or if breathing issues develop.   The patient was advised to call immediately if he has any concerning symptoms in the interval. The patient voices understanding of current disease status and treatment options and is in agreement with the current care plan.  All questions were answered. The patient knows to call the clinic with any problems, questions or concerns. We can certainly see the patient much sooner if necessary.  The total time spent in the appointment was 30 minutes.  Disclaimer: This note was dictated with voice recognition software. Similar sounding words can inadvertently be transcribed and may not be corrected upon review.

## 2023-10-10 NOTE — Progress Notes (Signed)
Nursing interview for New hypermetabolic left lower lobe lung nodule.    Patient identity verified x2.  Patient reports ***. Patient denies any other related issues at this time.  Meaningful use complete.  Vitals- ***  This concludes the interaction.  [name], LPN

## 2023-10-11 ENCOUNTER — Ambulatory Visit
Admission: RE | Admit: 2023-10-11 | Discharge: 2023-10-11 | Disposition: A | Discharge status: Home / Self Care | Payer: PPO | Source: Ambulatory Visit | Attending: Radiation Oncology | Admitting: Radiation Oncology

## 2023-10-11 ENCOUNTER — Encounter: Payer: Self-pay | Admitting: Radiation Oncology

## 2023-10-11 VITALS — BP 141/77 | HR 74 | Temp 97.1°F | Resp 18 | Ht 71.0 in | Wt 183.0 lb

## 2023-10-11 DIAGNOSIS — C3432 Malignant neoplasm of lower lobe, left bronchus or lung: Secondary | ICD-10-CM | POA: Diagnosis not present

## 2023-10-11 DIAGNOSIS — J984 Other disorders of lung: Secondary | ICD-10-CM | POA: Insufficient documentation

## 2023-10-11 DIAGNOSIS — M129 Arthropathy, unspecified: Secondary | ICD-10-CM | POA: Insufficient documentation

## 2023-10-11 DIAGNOSIS — N189 Chronic kidney disease, unspecified: Secondary | ICD-10-CM | POA: Insufficient documentation

## 2023-10-11 DIAGNOSIS — Z85528 Personal history of other malignant neoplasm of kidney: Secondary | ICD-10-CM | POA: Diagnosis not present

## 2023-10-11 DIAGNOSIS — R918 Other nonspecific abnormal finding of lung field: Secondary | ICD-10-CM | POA: Diagnosis not present

## 2023-10-11 DIAGNOSIS — I129 Hypertensive chronic kidney disease with stage 1 through stage 4 chronic kidney disease, or unspecified chronic kidney disease: Secondary | ICD-10-CM | POA: Diagnosis not present

## 2023-10-11 DIAGNOSIS — Z79899 Other long term (current) drug therapy: Secondary | ICD-10-CM | POA: Insufficient documentation

## 2023-10-11 DIAGNOSIS — Z905 Acquired absence of kidney: Secondary | ICD-10-CM | POA: Diagnosis not present

## 2023-10-11 DIAGNOSIS — Z923 Personal history of irradiation: Secondary | ICD-10-CM | POA: Diagnosis not present

## 2023-10-11 DIAGNOSIS — Z7982 Long term (current) use of aspirin: Secondary | ICD-10-CM | POA: Diagnosis not present

## 2023-10-11 DIAGNOSIS — Z8673 Personal history of transient ischemic attack (TIA), and cerebral infarction without residual deficits: Secondary | ICD-10-CM | POA: Insufficient documentation

## 2023-10-11 DIAGNOSIS — Z79624 Long term (current) use of inhibitors of nucleotide synthesis: Secondary | ICD-10-CM | POA: Diagnosis not present

## 2023-10-11 DIAGNOSIS — F17210 Nicotine dependence, cigarettes, uncomplicated: Secondary | ICD-10-CM | POA: Insufficient documentation

## 2023-10-11 DIAGNOSIS — I700 Atherosclerosis of aorta: Secondary | ICD-10-CM | POA: Diagnosis not present

## 2023-10-11 NOTE — Progress Notes (Signed)
Radiation Oncology         [phone] ________________________________  Name: Chris Holloway        MRN: [id number]  Date of Service: 10/11/2023 DOB: 20-Mar-1951  WU:[id number], Tawni Pummel, MD  Si Gaul, MD     REFERRING PHYSICIAN: Si Gaul, MD   DIAGNOSIS: The primary encounter diagnosis was Primary malignant neoplasm of left lower lobe of lung (HCC). A diagnosis of Malignant neoplasm of upper lobe of left lung (HCC) was also pertinent to this visit.   HISTORY OF PRESENT ILLNESS: Chris Holloway is a 73 y.o. male with a history of Stage IA2, cT1bN0M0, NSCLC, adenocarcinoma of the LLL. He also has a history of  left renal cell carcinoma treated with robotic radical nephrectomy in 2021. While being followed in surveillance, a CT in December 2022 showed a persistent nodule in the left lower lobe measuring 1.9 cm. A PET scan on 09/21/2021 showed a 2 cm left lower lobe nodule with an SUV max of 7; numerous other smaller nodules were suboptimally evaluated due to size and other numerous nodules were unchanged from prior scans without hypermetabolism.  Bronchoscopy on 10/19/2021 showed malignant cells consistent with adenocarcinoma in the FNA of the left lower lobe.  The patient was not interested in surgical resection and rather, completed an ultrahypofractionated course of ablative radiotherapy as definitive treatment.  He has been followed by medical oncology since, and a CT Chest without contrast on 09/07/2023.  Progressive left lung nodularity was noted including a nodule in the medial left upper lobe measuring 11 mm, previously 8 mm, a subpleural nodule in the anterior left lower lobe measuring 2 cm, previously 1.8 cm, an irregular nodule in the medial left lower lobe measuring 2.3 cm, previously 2.1 cm, and in aggregate nodule in the central left lower lobe measuring 1.7 cm, previously 2 discrete adjacent nodules each measuring 9 mm. With these findings a PET scan was performed on  09/21/2023, and this showed hypermetabolic activity in the lung anterior to the lingula measuring 2 cm x 0.9 cm with an SUV of 2.5 and in retrospect was smaller with an SUV of 1.6 in 2023.  This was concerning for malignancy.  Post radiation treatment related changes were also noted in the medial left lower lobe with an SUV of 2.9, previously 7 in 2023.  Clustered groundglass nodularity in the right middle lobe was noted as well with an SUV of 1.5 favored to be an infectious process.  Given the concerns for the new nodule at the level anterior to the lingula and the left lung, he is seen to consider additional course of stereotactic or ablative radiotherapy.    PREVIOUS RADIATION THERAPY:    11/16/2021 through 11/25/2021 Ultrahypofractionated Radiation Site Technique Total Dose (Gy) Dose per Fx (Gy) Completed Fx Beam Energies  Lung, Left: Lung_L IMRT 60/60 7.5 8/8 6XFFF     PAST MEDICAL HISTORY:  Past Medical History:  Diagnosis Date   Arthritis    hands back,    Chronic kidney disease    Per Dr. Tiburcio Pea but not with Dr. Berneice Heinrich   Hypertension    Stroke [facility]) 1998   mild stroke like symptoms lasted 5 min.       PAST SURGICAL HISTORY: Past Surgical History:  Procedure Laterality Date   BRONCHIAL BIOPSY  10/19/2021   Procedure: BRONCHIAL BIOPSIES;  Surgeon: Josephine Igo, DO;  Location: [facility];  Service: Pulmonary;;   BRONCHIAL NEEDLE ASPIRATION BIOPSY  10/19/2021  Procedure: BRONCHIAL NEEDLE ASPIRATION BIOPSIES;  Surgeon: Josephine Igo, DO;  Location: [facility];  Service: Pulmonary;;   FIDUCIAL MARKER PLACEMENT  10/19/2021   Procedure: FIDUCIAL MARKER PLACEMENT;  Surgeon: Josephine Igo, DO;  Location: [facility];  Service: Pulmonary;;   HAND LIGAMENT RECONSTRUCTION Right 1990s   Saw injury   ROBOTIC ASSITED PARTIAL NEPHRECTOMY Left 07/15/2020   Procedure: XI ROBOTIC ASSITED RADICAL NEPHRECTOMY WITH INTRAOPERAATIVE ULTRASOUND;  Surgeon: Sebastian Ache, MD;  Location:  [facility];  Service: Urology;  Laterality: Left;  3 HRS   SHOULDER ARTHROSCOPY W/ ROTATOR CUFF REPAIR Left 08/2018   VIDEO BRONCHOSCOPY WITH RADIAL ENDOBRONCHIAL ULTRASOUND  10/19/2021   Procedure: RADIAL ENDOBRONCHIAL ULTRASOUND;  Surgeon: Josephine Igo, DO;  Location: [facility];  Service: Pulmonary;;     FAMILY HISTORY: No family history on file.   SOCIAL HISTORY:  reports that he has been smoking cigarettes. He has a 22.5 pack-year smoking history. He has never used smokeless tobacco. He reports that he does not currently use alcohol. He reports that he does not use drugs.  The patient is widowed and lives in [place], but is in a relationship.  He works as a Librarian, academic. He is very active and walks 5+ miles per day and has for more than 30 years.   ALLERGIES: Patient has no known allergies.   MEDICATIONS:  Current Outpatient Medications  Medication Sig Dispense Refill   ALPRAZolam (XANAX) 1 MG tablet Take 1.5 mg by mouth at bedtime.     Ascorbic Acid (VITAMIN C) 1000 MG tablet Take 1,000 mg by mouth daily.     aspirin EC 81 MG tablet Take 81 mg by mouth at bedtime. Swallow whole.     cetirizine (ZYRTEC) 10 MG tablet Take 10 mg by mouth daily.     chlorthalidone (HYGROTON) 25 MG tablet Take 25 mg by mouth at bedtime.     guaifenesin (HUMIBID E) 400 MG TABS tablet Take 400 mg by mouth in the morning and at bedtime.     Polyvinyl Alcohol-Povidone (CLEAR EYES ALL SEASONS) 5-6 MG/ML SOLN Place 1 drop into both eyes daily as needed (Dry eyes).     pravastatin (PRAVACHOL) 40 MG tablet Take 40 mg by mouth at bedtime.     sildenafil (VIAGRA) 100 MG tablet Take 100 mg by mouth daily as needed for erectile dysfunction.     valACYclovir (VALTREX) 500 MG tablet Take 500 mg by mouth daily.     No current facility-administered medications for this encounter.     REVIEW OF SYSTEMS: On review of systems, the patient reports that he is doing well. He continues to struggle with trying  to cut back on smoking, and on average smokes 1/2-3/4 ppd. He denies any chest pain or shortness of breath. He continues to be very physically active. No other complaints are verbalized.      PHYSICAL EXAM:  Wt Readings from Last 3 Encounters:  10/05/23 182 lb 14.4 oz (83 kg)  09/12/23 181 lb 6.4 oz (82.3 kg)  03/13/23 172 lb 5 oz (78.2 kg)   Temp Readings from Last 3 Encounters:  10/05/23 (!) 97.3 F (36.3 C) (Temporal)  09/12/23 (!) 97.3 F (36.3 C) (Temporal)  03/13/23 98.1 F (36.7 C) (Oral)   BP Readings from Last 3 Encounters:  10/05/23 120/76  09/12/23 137/82  03/13/23 123/76   Pulse Readings from Last 3 Encounters:  10/05/23 85  09/12/23 81  03/13/23 78    /10  In general this  is a well appearing caucasian male in no acute distress. He's alert and oriented x4 and appropriate throughout the examination. Cardiopulmonary assessment is negative for acute distress and he exhibits normal effort.     ECOG = 0  0 - Asymptomatic (Fully active, able to carry on all predisease activities without restriction)  1 - Symptomatic but completely ambulatory (Restricted in physically strenuous activity but ambulatory and able to carry out work of a light or sedentary nature. For example, light housework, office work)  2 - Symptomatic, <50% in bed during the day (Ambulatory and capable of all self care but unable to carry out any work activities. Up and about more than 50% of waking hours)  3 - Symptomatic, >50% in bed, but not bedbound (Capable of only limited self-care, confined to bed or chair 50% or more of waking hours)  4 - Bedbound (Completely disabled. Cannot carry on any self-care. Totally confined to bed or chair)  5 - Death   Santiago Glad MM, Creech RH, Tormey DC, et al. [phone]). "Toxicity and response criteria of the [facility]". Am. Evlyn Clines. Oncol. 5 (6): 649-55    LABORATORY DATA:  Lab Results  Component Value Date   WBC 7.0 09/07/2023   HGB  13.9 09/07/2023   HCT 39.6 09/07/2023   MCV 103.9 (H) 09/07/2023   PLT 320 09/07/2023   Lab Results  Component Value Date   NA 131 (L) 09/07/2023   K 3.9 09/07/2023   CL 93 (L) 09/07/2023   CO2 32 09/07/2023   Lab Results  Component Value Date   ALT 18 09/07/2023   AST 23 09/07/2023   ALKPHOS 84 09/07/2023   BILITOT 0.6 09/07/2023      RADIOGRAPHY: NM PET Image Restage (PS) Skull Base to Thigh (F-18 FDG) Result Date: 09/30/2023 CLINICAL DATA:  Subsequent treatment strategy for non-small cell lung cancer. EXAM: NUCLEAR MEDICINE PET SKULL BASE TO THIGH TECHNIQUE: 9.0 mCi F-18 FDG was injected intravenously. Full-ring PET imaging was performed from the skull base to thigh after the radiotracer. CT data was obtained and used for attenuation correction and anatomic localization. Fasting blood glucose: 102 mg/dl COMPARISON:  41/32/4401, and also chest CT from 09/07/2023 FINDINGS: Mediastinal blood pool activity: SUV max 2.0 Liver activity: SUV max NA NECK: Symmetric salivary and lymphoid tissue activity similar to the prior exam and considered benign/physiologic. Incidental CT findings: Small mucous retention cyst in the left maxillary sinus. Bilateral common carotid atheromatous vascular calcifications. CHEST: Volume loss and therapy related findings along with a fiducial in the vicinity of the medial left lower lobe nodule adjacent to the descending thoracic aorta, current maximum SUV 2.9, previous SUV 7.0. A 2.0 by 0.9 cm nodule anteriorly in the lingula on image 98 of series 4 has a maximum SUV of 2.5. This previously measured 1.0 by 0.4 cm with a previous maximum SUV of 1.6 on 09/21/2021. The progression is compatible with malignancy. Clustered ground-glass density and branching nodularity in the right middle lobe on image 99 series 4 is observed with maximum SUV 1.5. Favor atypical infectious process based on morphology, although technically nonspecific. The progressive subpleural nodularity in  the left lung especially along the left lower lobe is not currently substantially hypermetabolic although much of this is below sensitive PET-CT size thresholds. Much of this is progressive compared to 09/21/2021. Incidental CT findings: Coronary, aortic arch, and branch vessel atherosclerotic vascular disease. ABDOMEN/PELVIS: No significant abnormal hypermetabolic activity in this region. Incidental CT findings: Left nephrectomy. Atherosclerosis  is present, including aortoiliac atherosclerotic disease. Prominent stool throughout the colon favors constipation. Mildly retracted left testicle. SKELETON: No significant abnormal hypermetabolic activity in this region. Incidental CT findings: Dextroconvex lumbar scoliosis. IMPRESSION: 1. Therapy related findings including volume loss along the medial left lower lobe nodule adjacent to the descending thoracic aorta which has decreased in metabolic activity, currently maximum SUV 2.9, previous SUV 7.0. 2. A 2.0 by 0.9 cm nodule anteriorly in the lingula has a maximum SUV of 2.5, previously smaller and with SUV 1.6 on 09/21/2021. The progression is compatible with malignancy. 3. The other progressive subpleural nodularity in the left lung especially along the left lower lobe is not currently substantially hypermetabolic although much of this is below sensitive PET-CT size thresholds. 4. Clustered ground-glass density and branching nodularity in the right middle lobe is observed with maximum SUV 1.5. Favor atypical infectious process based on morphology, although technically nonspecific. 5. Prominent stool throughout the colon favors constipation. 6. Dextroconvex lumbar scoliosis. 7. Aortic, coronary, and systemic atherosclerosis. Electronically Signed   By: Gaylyn Rong M.D.   On: 09/30/2023 15:51       IMPRESSION/PLAN: 1. Putative Stage IA3, cT1cN0M0, NSCLC of the LLL. Dr. Mitzi Hansen has reviewed the patient's course and imaging to date. Dr. Mitzi Hansen agrees with  radiology that the PET scan findings are concerning for a new diagnosis of primary lung cancer. He would recommend a definitive course of radiotherapy. The location appears more favorable for stereotactic technique and he anticipates a course of 3-5 fractions of radiation to the LLL target. We discussed the risks, benefits, short, and long term effects of radiotherapy, as well as the curative intent, and the patient is interested in proceeding. Written consent is obtained and placed in the chart, a copy was provided to the patient. The patient will be contacted to coordinate treatment planning by our simulation department.  2. History of Stage IA2, cT1bN0M0, NSCLC, adenocarcinoma of the LLL. The patient has done well since prior ablative radiation and continues to be followed in surveillance. This will continue following treatment of #1.  2. History of Left Renal Cell Carcinoma. He has been NED since surgery and will continue in surveillance with Dr. Berneice Heinrich at [facility].  In a visit lasting 45 minutes, greater than 50% of the time was spent face to face discussing the patient's condition, in preparation for the discussion, and coordinating the patient's care.       Osker Mason, [facility]   **Disclaimer: This note was dictated with voice recognition software. Similar sounding words can inadvertently be transcribed and this note may contain transcription errors which may not have been corrected upon publication of note.**

## 2023-10-12 DIAGNOSIS — F17210 Nicotine dependence, cigarettes, uncomplicated: Secondary | ICD-10-CM | POA: Diagnosis not present

## 2023-10-12 DIAGNOSIS — C3432 Malignant neoplasm of lower lobe, left bronchus or lung: Secondary | ICD-10-CM | POA: Diagnosis not present

## 2023-10-19 ENCOUNTER — Ambulatory Visit: Payer: PPO | Admitting: Radiation Oncology

## 2023-10-23 ENCOUNTER — Ambulatory Visit
Admission: RE | Admit: 2023-10-23 | Discharge: 2023-10-23 | Disposition: A | Discharge status: Home / Self Care | Payer: PPO | Source: Ambulatory Visit | Attending: Radiation Oncology | Admitting: Radiation Oncology

## 2023-10-23 DIAGNOSIS — C3432 Malignant neoplasm of lower lobe, left bronchus or lung: Secondary | ICD-10-CM | POA: Diagnosis not present

## 2023-10-23 DIAGNOSIS — F17210 Nicotine dependence, cigarettes, uncomplicated: Secondary | ICD-10-CM | POA: Diagnosis not present

## 2023-10-27 DIAGNOSIS — C3432 Malignant neoplasm of lower lobe, left bronchus or lung: Secondary | ICD-10-CM | POA: Diagnosis not present

## 2023-10-27 DIAGNOSIS — F17210 Nicotine dependence, cigarettes, uncomplicated: Secondary | ICD-10-CM | POA: Diagnosis not present

## 2023-10-31 ENCOUNTER — Ambulatory Visit
Admission: RE | Admit: 2023-10-31 | Discharge: 2023-10-31 | Disposition: A | Discharge status: Home / Self Care | Payer: PPO | Source: Ambulatory Visit | Attending: Radiation Oncology | Admitting: Radiation Oncology

## 2023-10-31 ENCOUNTER — Other Ambulatory Visit: Payer: Self-pay

## 2023-10-31 DIAGNOSIS — C3432 Malignant neoplasm of lower lobe, left bronchus or lung: Secondary | ICD-10-CM | POA: Diagnosis not present

## 2023-10-31 LAB — RAD ONC ARIA SESSION SUMMARY
Course Elapsed Days: 0
Plan Fractions Treated to Date: 1
Plan Prescribed Dose Per Fraction: 18 Gy
Plan Total Fractions Prescribed: 3
Plan Total Prescribed Dose: 54 Gy
Reference Point Dosage Given to Date: 18 Gy
Reference Point Session Dosage Given: 18 Gy
Session Number: 1

## 2023-11-01 ENCOUNTER — Ambulatory Visit: Payer: PPO | Admitting: Radiation Oncology

## 2023-11-02 ENCOUNTER — Ambulatory Visit
Admission: RE | Admit: 2023-11-02 | Discharge: 2023-11-02 | Disposition: A | Discharge status: Home / Self Care | Payer: PPO | Source: Ambulatory Visit | Attending: Radiation Oncology | Admitting: Radiation Oncology

## 2023-11-02 ENCOUNTER — Other Ambulatory Visit: Payer: Self-pay

## 2023-11-02 DIAGNOSIS — C3432 Malignant neoplasm of lower lobe, left bronchus or lung: Secondary | ICD-10-CM | POA: Diagnosis not present

## 2023-11-02 LAB — RAD ONC ARIA SESSION SUMMARY
Course Elapsed Days: 2
Plan Fractions Treated to Date: 2
Plan Prescribed Dose Per Fraction: 18 Gy
Plan Total Fractions Prescribed: 3
Plan Total Prescribed Dose: 54 Gy
Reference Point Dosage Given to Date: 36 Gy
Reference Point Session Dosage Given: 18 Gy
Session Number: 2

## 2023-11-03 ENCOUNTER — Ambulatory Visit: Payer: PPO | Admitting: Radiation Oncology

## 2023-11-06 ENCOUNTER — Other Ambulatory Visit: Payer: Self-pay

## 2023-11-06 ENCOUNTER — Ambulatory Visit
Admission: RE | Admit: 2023-11-06 | Discharge: 2023-11-06 | Disposition: A | Discharge status: Home / Self Care | Source: Ambulatory Visit | Attending: Radiation Oncology

## 2023-11-06 ENCOUNTER — Ambulatory Visit
Admission: RE | Admit: 2023-11-06 | Discharge: 2023-11-06 | Disposition: A | Discharge status: Home / Self Care | Payer: PPO | Source: Ambulatory Visit | Attending: Radiation Oncology | Admitting: Radiation Oncology

## 2023-11-06 DIAGNOSIS — F17210 Nicotine dependence, cigarettes, uncomplicated: Secondary | ICD-10-CM | POA: Diagnosis not present

## 2023-11-06 DIAGNOSIS — C3432 Malignant neoplasm of lower lobe, left bronchus or lung: Secondary | ICD-10-CM | POA: Diagnosis not present

## 2023-11-06 DIAGNOSIS — Z510 Encounter for antineoplastic radiation therapy: Secondary | ICD-10-CM | POA: Diagnosis not present

## 2023-11-06 LAB — RAD ONC ARIA SESSION SUMMARY
Course Elapsed Days: 6
Plan Fractions Treated to Date: 3
Plan Prescribed Dose Per Fraction: 18 Gy
Plan Total Fractions Prescribed: 3
Plan Total Prescribed Dose: 54 Gy
Reference Point Dosage Given to Date: 54 Gy
Reference Point Session Dosage Given: 18 Gy
Session Number: 3

## 2023-11-07 NOTE — Radiation Completion Notes (Addendum)
  Radiation Oncology         (336) [phone] ________________________________  Name: Chris Holloway MRN: [id number]  Date of Service: 11/06/2023  DOB: 10/13/1950  End of Treatment Note  Diagnosis:  Putative Stage IA3, cT1cN0M0, NSCLC of the LLL  Intent: Curative     ==========DELIVERED PLANS==========  First Treatment Date: 2023-10-31 Last Treatment Date: 2023-11-06   Plan Name: Lung_L_SBRT Site: Lung, Left Technique: SBRT/SRT-IMRT Mode: Photon Dose Per Fraction: 18 Gy Prescribed Dose (Delivered / Prescribed): 54 Gy / 54 Gy Prescribed Fxs (Delivered / Prescribed): 3 / 3     ==========ON TREATMENT VISIT DATES========== 2023-10-31, 2023-11-02, 2023-11-06, 2023-11-06    See weekly On Treatment Notes in Epic for details in the Media tab (listed as Progress notes on the On Treatment Visit Dates listed above). The patient tolerated radiation.    The patient will receive a call in about one month from the radiation oncology department. He will continue follow up with Dr. Arbutus Ped as well.      Osker Mason, PAC

## 2023-11-08 ENCOUNTER — Ambulatory Visit: Payer: PPO | Admitting: Radiation Oncology

## 2023-11-10 ENCOUNTER — Ambulatory Visit: Payer: PPO | Admitting: Radiation Oncology

## 2023-11-28 DIAGNOSIS — N529 Male erectile dysfunction, unspecified: Secondary | ICD-10-CM | POA: Diagnosis not present

## 2023-11-28 DIAGNOSIS — L209 Atopic dermatitis, unspecified: Secondary | ICD-10-CM | POA: Diagnosis not present

## 2023-11-28 DIAGNOSIS — C3490 Malignant neoplasm of unspecified part of unspecified bronchus or lung: Secondary | ICD-10-CM | POA: Diagnosis not present

## 2023-11-28 DIAGNOSIS — E7800 Pure hypercholesterolemia, unspecified: Secondary | ICD-10-CM | POA: Diagnosis not present

## 2023-11-28 DIAGNOSIS — N183 Chronic kidney disease, stage 3 unspecified: Secondary | ICD-10-CM | POA: Diagnosis not present

## 2023-11-28 DIAGNOSIS — F17200 Nicotine dependence, unspecified, uncomplicated: Secondary | ICD-10-CM | POA: Diagnosis not present

## 2023-11-28 DIAGNOSIS — I10 Essential (primary) hypertension: Secondary | ICD-10-CM | POA: Diagnosis not present

## 2023-11-28 DIAGNOSIS — F419 Anxiety disorder, unspecified: Secondary | ICD-10-CM | POA: Diagnosis not present

## 2023-11-28 DIAGNOSIS — J438 Other emphysema: Secondary | ICD-10-CM | POA: Diagnosis not present

## 2023-12-01 NOTE — Progress Notes (Signed)
  Radiation Oncology         (336) [phone] ________________________________  Name: Chris Holloway MRN: [id number]  Date of Service: 12/01/2023  DOB: 1950/09/08  Post Treatment Telephone Note  Diagnosis:  Putative Stage IA3, cT1cN0M0, NSCLC of the LLL (as documented in provider EOT note)  The patient was available for call today.   Symptoms of fatigue have improved since completing therapy.  Symptoms of skin changes have improved since completing therapy.  Symptoms of esophagitis have improved since completing therapy.  Patient reports   The patient has scheduled follow up with his medical oncologist Dr. Arbutus Ped for ongoing care, and was encouraged to call if he develops concerns or questions regarding radiation.   This concludes the interaction.  [name], LPN

## 2023-12-04 ENCOUNTER — Ambulatory Visit
Admission: RE | Admit: 2023-12-04 | Discharge: 2023-12-04 | Disposition: A | Discharge status: Home / Self Care | Source: Ambulatory Visit | Attending: Internal Medicine | Admitting: Internal Medicine

## 2023-12-05 DIAGNOSIS — E871 Hypo-osmolality and hyponatremia: Secondary | ICD-10-CM | POA: Diagnosis not present

## 2023-12-06 DIAGNOSIS — J020 Streptococcal pharyngitis: Secondary | ICD-10-CM | POA: Diagnosis not present

## 2023-12-06 DIAGNOSIS — Z20818 Contact with and (suspected) exposure to other bacterial communicable diseases: Secondary | ICD-10-CM | POA: Diagnosis not present

## 2023-12-13 DIAGNOSIS — E871 Hypo-osmolality and hyponatremia: Secondary | ICD-10-CM | POA: Diagnosis not present

## 2024-01-23 ENCOUNTER — Encounter (HOSPITAL_COMMUNITY): Payer: Self-pay

## 2024-01-23 ENCOUNTER — Inpatient Hospital Stay: Payer: PPO | Attending: Internal Medicine

## 2024-01-23 ENCOUNTER — Ambulatory Visit (HOSPITAL_COMMUNITY)
Admission: RE | Admit: 2024-01-23 | Discharge: 2024-01-23 | Disposition: A | Discharge status: Home / Self Care | Source: Ambulatory Visit | Attending: Internal Medicine | Admitting: Internal Medicine

## 2024-01-23 DIAGNOSIS — Z923 Personal history of irradiation: Secondary | ICD-10-CM | POA: Insufficient documentation

## 2024-01-23 DIAGNOSIS — Z85118 Personal history of other malignant neoplasm of bronchus and lung: Secondary | ICD-10-CM | POA: Diagnosis not present

## 2024-01-23 DIAGNOSIS — I700 Atherosclerosis of aorta: Secondary | ICD-10-CM | POA: Diagnosis not present

## 2024-01-23 DIAGNOSIS — C3490 Malignant neoplasm of unspecified part of unspecified bronchus or lung: Secondary | ICD-10-CM | POA: Insufficient documentation

## 2024-01-23 DIAGNOSIS — J439 Emphysema, unspecified: Secondary | ICD-10-CM | POA: Diagnosis not present

## 2024-01-23 LAB — CMP (CANCER CENTER ONLY)
ALT: 14 U/L (ref 0–44)
AST: 21 U/L (ref 15–41)
Albumin: 4 g/dL (ref 3.5–5.0)
Alkaline Phosphatase: 80 U/L (ref 38–126)
Anion gap: 8 (ref 5–15)
BUN: 18 mg/dL (ref 8–23)
CO2: 28 mmol/L (ref 22–32)
Calcium: 9.6 mg/dL (ref 8.9–10.3)
Chloride: 100 mmol/L (ref 98–111)
Creatinine: 1.58 mg/dL — ABNORMAL HIGH (ref 0.61–1.24)
GFR, Estimated: 47 mL/min — ABNORMAL LOW (ref >60)
Glucose, Bld: 89 mg/dL (ref 70–99)
Potassium: 4 mmol/L (ref 3.5–5.1)
Sodium: 136 mmol/L (ref 135–145)
Total Bilirubin: 0.4 mg/dL (ref 0.0–1.2)
Total Protein: 7.5 g/dL (ref 6.5–8.1)

## 2024-01-23 LAB — CBC WITH DIFFERENTIAL (CANCER CENTER ONLY)
Abs Immature Granulocytes: 0.02 10*3/uL (ref 0.00–0.07)
Basophils Absolute: 0.1 10*3/uL (ref 0.0–0.1)
Basophils Relative: 1 %
Eosinophils Absolute: 0.2 10*3/uL (ref 0.0–0.5)
Eosinophils Relative: 2 %
HCT: 37.6 % — ABNORMAL LOW (ref 39.0–52.0)
Hemoglobin: 13.3 g/dL (ref 13.0–17.0)
Immature Granulocytes: 0 %
Lymphocytes Relative: 24 %
Lymphs Abs: 2.2 10*3/uL (ref 0.7–4.0)
MCH: 36.3 pg — ABNORMAL HIGH (ref 26.0–34.0)
MCHC: 35.4 g/dL (ref 30.0–36.0)
MCV: 102.7 fL — ABNORMAL HIGH (ref 80.0–100.0)
Monocytes Absolute: 0.6 10*3/uL (ref 0.1–1.0)
Monocytes Relative: 7 %
Neutro Abs: 6 10*3/uL (ref 1.7–7.7)
Neutrophils Relative %: 66 %
Platelet Count: 373 10*3/uL (ref 150–400)
RBC: 3.66 MIL/uL — ABNORMAL LOW (ref 4.22–5.81)
RDW: 12.9 % (ref 11.5–15.5)
WBC Count: 9.1 10*3/uL (ref 4.0–10.5)
nRBC: 0 % (ref 0.0–0.2)

## 2024-01-30 ENCOUNTER — Inpatient Hospital Stay: Payer: PPO | Attending: Internal Medicine | Admitting: Internal Medicine

## 2024-01-30 VITALS — BP 116/70 | HR 79 | Temp 98.0°F | Resp 17 | Wt 175.4 lb

## 2024-01-30 DIAGNOSIS — C3490 Malignant neoplasm of unspecified part of unspecified bronchus or lung: Secondary | ICD-10-CM

## 2024-01-30 DIAGNOSIS — Z923 Personal history of irradiation: Secondary | ICD-10-CM | POA: Insufficient documentation

## 2024-01-30 DIAGNOSIS — Z85118 Personal history of other malignant neoplasm of bronchus and lung: Secondary | ICD-10-CM | POA: Insufficient documentation

## 2024-01-30 MED ORDER — METHYLPREDNISOLONE 4 MG PO TBPK: ORAL_TABLET | ORAL | 0 refills | Status: DC

## 2024-01-30 NOTE — Progress Notes (Signed)
 [facility] [phone]   Fax:([phone]) [phone]  OFFICE PROGRESS NOTE  Roselind Congo, MD [address] [place] [address]  DIAGNOSIS:  1) suspicious multifocal adenocarcinoma presented with hypermetabolic left lower lobe lung nodule suspicious for disease recurrence diagnosed in January 2025. 2)Stage IA (T1b, N0, M0) non-small cell lung cancer, adenocarcinoma.  The patient presented with a left lower lobe lung nodule.  She was diagnosed in February 2023.   PRIOR THERAPY:  1) SBRT to the left lower lobe lung nodule under the care of Dr. Jeryl Moris completed on November 25, 2021 2) SBRT to the lingular nodule under the care of Dr. Jeryl Moris in February 2025  CURRENT THERAPY: Observation  INTERVAL HISTORY: Chris Holloway 73 y.o. male returns to the clinic today for 41-month follow-up visit.Discussed the use of AI scribe software for clinical note transcription with the patient, who gave verbal consent to proceed.  History of Present Illness   Chris Holloway is a 73 year old male with stage Ia non-small cell lung cancer who presents for evaluation and repeat blood work.  He has a history of stage Ia non-small cell lung cancer with a left lower lobe lung nodule identified in February 2023, treated with Stereotactic Body Radiation Therapy (SPRT). In January 2025, another lung nodule in the lingula was found to be positive on PET scan and was also treated with SPRT. Current imaging shows multiple stable nodules, with the lingula nodule decreasing in size post-radiation.  He has experienced a chronic cough for the past month, describing it as 'sort of raw like in my throat' and associated with coughing up 'a lot of phlegm' for months. He was prescribed a cough medicine by his family doctor, which he administers using a syringe. He believes the medication is called 'Promethazine -DM'.  He also notes experiencing a 'little shortness of breath here  lately', which is likely related to his chronic obstructive pulmonary disease (COPD).       MEDICAL HISTORY: Past Medical History:  Diagnosis Date   Arthritis    hands back,    Chronic kidney disease    Per Dr. Raquel Cables but not with Dr. Secundino Dach   Hypertension    lung ca 2023   Renal cancer (HCC) 2021   Stroke (HCC) 1998   mild stroke like symptoms lasted 5 min.    ALLERGIES:  has no known allergies.  MEDICATIONS:  Current Outpatient Medications  Medication Sig Dispense Refill   promethazine -dextromethorphan (PROMETHAZINE -DM) 6.25-15 MG/5ML syrup 5 mL as needed Orally every 6 hrs for 7 days     ALPRAZolam  (XANAX ) 1 MG tablet Take 1.5 mg by mouth at bedtime.     Ascorbic Acid (VITAMIN C) 1000 MG tablet Take 1,000 mg by mouth daily.     aspirin EC 81 MG tablet Take 81 mg by mouth at bedtime. Swallow whole.     buPROPion (WELLBUTRIN) 75 MG tablet Take 10 mg by mouth 2 (two) times daily.     cetirizine (ZYRTEC) 10 MG tablet Take 10 mg by mouth daily.     chlorthalidone (HYGROTON) 25 MG tablet Take 25 mg by mouth at bedtime.     guaifenesin (HUMIBID E) 400 MG TABS tablet Take 400 mg by mouth in the morning and at bedtime.     Polyvinyl Alcohol-Povidone (CLEAR EYES ALL SEASONS) 5-6 MG/ML SOLN Place 1 drop into both eyes daily as needed (Dry eyes).     pravastatin (  PRAVACHOL) 40 MG tablet Take 40 mg by mouth at bedtime.     sildenafil (VIAGRA) 100 MG tablet Take 100 mg by mouth daily as needed for erectile dysfunction.     valACYclovir (VALTREX) 500 MG tablet Take 500 mg by mouth daily.     No current facility-administered medications for this visit.    SURGICAL HISTORY:  Past Surgical History:  Procedure Laterality Date   BRONCHIAL BIOPSY  10/19/2021   Procedure: BRONCHIAL BIOPSIES;  Surgeon: Prudy Brownie, DO;  Location: [facility];  Service: Pulmonary;;   BRONCHIAL NEEDLE ASPIRATION BIOPSY  10/19/2021   Procedure: BRONCHIAL NEEDLE ASPIRATION BIOPSIES;  Surgeon: Prudy Brownie, DO;  Location: [facility];  Service: Pulmonary;;   FIDUCIAL MARKER PLACEMENT  10/19/2021   Procedure: FIDUCIAL MARKER PLACEMENT;  Surgeon: Prudy Brownie, DO;  Location: [facility];  Service: Pulmonary;;   HAND LIGAMENT RECONSTRUCTION Right 1990s   Saw injury   ROBOTIC ASSITED PARTIAL NEPHRECTOMY Left 07/15/2020   Procedure: XI ROBOTIC ASSITED RADICAL NEPHRECTOMY WITH INTRAOPERAATIVE ULTRASOUND;  Surgeon: Osborn Blaze, MD;  Location: [facility];  Service: Urology;  Laterality: Left;  3 HRS   SHOULDER ARTHROSCOPY W/ ROTATOR CUFF REPAIR Left 08/2018   VIDEO BRONCHOSCOPY WITH RADIAL ENDOBRONCHIAL ULTRASOUND  10/19/2021   Procedure: RADIAL ENDOBRONCHIAL ULTRASOUND;  Surgeon: Prudy Brownie, DO;  Location: [facility];  Service: Pulmonary;;    REVIEW OF SYSTEMS:  Constitutional: positive for fatigue Eyes: negative Ears, nose, mouth, throat, and face: negative Respiratory: positive for cough and sputum Cardiovascular: negative Gastrointestinal: negative Genitourinary:negative Integument/breast: negative Hematologic/lymphatic: negative Musculoskeletal:negative Neurological: negative Behavioral/Psych: negative Endocrine: negative Allergic/Immunologic: negative   PHYSICAL EXAMINATION: General appearance: alert, cooperative, fatigued, and no distress Head: Normocephalic, without obvious abnormality, atraumatic Neck: no adenopathy, no JVD, supple, symmetrical, trachea midline, and thyroid  not enlarged, symmetric, no tenderness/mass/nodules Lymph nodes: Cervical, supraclavicular, and axillary nodes normal. Resp: clear to auscultation bilaterally Back: symmetric, no curvature. ROM normal. No CVA tenderness. Cardio: regular rate and rhythm, S1, S2 normal, no murmur, click, rub or gallop GI: soft, non-tender; bowel sounds normal; no masses,  no organomegaly Extremities: extremities normal, atraumatic, no cyanosis or edema Neurologic: Alert and oriented X 3, normal strength and tone.  Normal symmetric reflexes. Normal coordination and gait  ECOG PERFORMANCE STATUS: 1 - Symptomatic but completely ambulatory  Blood pressure 116/70, pulse 79, temperature 98 F (36.7 C), temperature source Temporal, resp. rate 17, weight 175 lb 6.4 oz (79.6 kg), SpO2 99%.  LABORATORY DATA: Lab Results  Component Value Date   WBC 9.1 01/23/2024   HGB 13.3 01/23/2024   HCT 37.6 (L) 01/23/2024   MCV 102.7 (H) 01/23/2024   PLT 373 01/23/2024      Chemistry      Component Value Date/Time   NA 136 01/23/2024 0933   K 4.0 01/23/2024 0933   CL 100 01/23/2024 0933   CO2 28 01/23/2024 0933   BUN 18 01/23/2024 0933   CREATININE 1.58 (H) 01/23/2024 0933      Component Value Date/Time   CALCIUM 9.6 01/23/2024 0933   ALKPHOS 80 01/23/2024 0933   AST 21 01/23/2024 0933   ALT 14 01/23/2024 0933   BILITOT 0.4 01/23/2024 0933       RADIOGRAPHIC STUDIES: CT Chest Wo Contrast Result Date: 01/29/2024 CLINICAL DATA:  Non-small cell lung cancer (NSCLC), staging. * Tracking Code: BO * EXAM: CT CHEST WITHOUT CONTRAST TECHNIQUE: Multidetector CT imaging of the chest was performed following the standard protocol without IV contrast. RADIATION DOSE  REDUCTION: This exam was performed according to the departmental dose-optimization program which includes automated exposure control, adjustment of the mA and/or kV according to patient size and/or use of iterative reconstruction technique. COMPARISON:  09/07/2023, 03/09/2023, 09/08/2022, and 10/15/2021. PET CT 09/21/2023 FINDINGS: Cardiovascular: Calcification of the aortic valve leaflets. Extensive multi-vessel coronary artery calcification. Global cardiac size within normal limits. No pericardial effusion. Central pulmonary arteries are of normal caliber. Mild atherosclerotic calcification within the thoracic aorta. No aortic aneurysm. Mediastinum/Nodes: No enlarged mediastinal or axillary lymph nodes. Thyroid  gland, trachea, and esophagus demonstrate no  significant findings. Lungs/Pleura: Mild emphysema. Stable bronchial wall thickening diffusely keeping with chronic airway inflammation. Findings suspicious for multifocal bronchial adenocarcinoma are again identified. Individual lesion measurements are is not listed below: Left upper lobe (94/8), 9 x 10 mm, stable Left upper lobe (105/8, noted to be hypermetabolic on prior PET-CT examination, 6 x 17 mm, decreased Medial left lower lobe, (112/8) 11 x 22 mm, stable when measured in similar fashion Central left lower lobe (122/8) 10 x 18 mm, stable. It is noteworthy, however, that this nodule demonstrates slow, but progressive enlargement when compared to remote prior examination of 10/15/2021 and is in keeping with a indolent neoplasm. Additional scattered perilymphatic nodules throughout the left lung are grossly stable. No new focal pulmonary nodules are identified. Stable soft tissue adjacent to the fiducial marker within the left perihilar region reflecting the original treated lesion. Small area of parenchymal scarring with bronchiolectasis within the basilar right middle lobe is unchanged, likely post inflammatory in nature. No pneumothorax or pleural effusion. No central obstructing lesion Upper Abdomen: No acute abnormality. Musculoskeletal: No chest wall mass or suspicious bone lesions identified. IMPRESSION: 1. Findings suspicious for multifocal bronchial adenocarcinoma are again identified. Overall stable disease with interval response to focal therapy of the target lesion within the anterior lingula noted to be hypermetabolic on prior PET CT of 09/21/2023. 2. Extensive multi-vessel coronary artery calcification. Aortic valvular calcification. 3. Mild emphysema.  Stable changes of chronic airway inflammation. Aortic Atherosclerosis (ICD10-I70.0) and Emphysema (ICD10-J43.9). Electronically Signed   By: Worthy Heads M.D.   On: 01/29/2024 22:52    ASSESSMENT AND PLAN: This is a very pleasant 73 years old  white male diagnosed with a stage Ia (T1b, N0, M0) non-small cell lung cancer presented with left lower lobe lung nodule diagnosed in February 2023 status post SBRT to the left lower lobe lung nodule completed on November 25, 2021. He has been in observation since that time. He had repeat CT scan of the chest performed recently.  Unfortunately his scan showed progressive left lung nodularities suspicious for recurrent/metastatic disease. This was followed by a PET scan and it showed a 2.0 x 0.9 cm nodule anteriorly in the lingula with maximum SUV of 2.5 and previously had SUV of 1.6 and suspicious for malignancy. He was treated with SBRT to the lingular nodule under the care of Dr. Jeryl Moris February 2025. He is currently on observation.    Non-small cell lung cancer, stage Ia Stage Ia non-small cell lung cancer with multiple lung nodules. Recent SPRT treatment of left lower lobe lingula nodule resulted in decreased size. Other nodules in the left upper lobe remain unchanged. No current evidence of growth, but malignancy risk persists. - Follow-up in four months to assess nodule status.  Chronic cough Chronic cough for one month with throat irritation and phlegm production, likely related to COPD. Cough medicine, possibly Promethazine -DM, prescribed by family doctor. No significant side effects from recent radiation  therapy. - Prescribe Medrol dose pack for cough and inflammation. - Coordinate with pharmacy for prescription fulfillment.  Chronic obstructive pulmonary disease (COPD) Shortness of breath likely due to COPD. No acute exacerbation. Medrol dose pack prescribed for inflammation and respiratory support. - Prescribe Medrol dose pack for inflammation and respiratory support.   The patient was advised to call immediately if he has any concerning symptoms in the interval. The patient voices understanding of current disease status and treatment options and is in agreement with the current care  plan.  All questions were answered. The patient knows to call the clinic with any problems, questions or concerns. We can certainly see the patient much sooner if necessary.  The total time spent in the appointment was 30 minutes.  Disclaimer: This note was dictated with voice recognition software. Similar sounding words can inadvertently be transcribed and may not be corrected upon review.

## 2024-02-13 DIAGNOSIS — N4821 Abscess of corpus cavernosum and penis: Secondary | ICD-10-CM | POA: Diagnosis not present

## 2024-02-13 DIAGNOSIS — A6002 Herpesviral infection of other male genital organs: Secondary | ICD-10-CM | POA: Diagnosis not present

## 2024-05-18 DIAGNOSIS — R0781 Pleurodynia: Secondary | ICD-10-CM | POA: Diagnosis not present

## 2024-05-22 DIAGNOSIS — N529 Male erectile dysfunction, unspecified: Secondary | ICD-10-CM | POA: Diagnosis not present

## 2024-05-22 DIAGNOSIS — R0789 Other chest pain: Secondary | ICD-10-CM | POA: Diagnosis not present

## 2024-05-22 DIAGNOSIS — Z0000 Encounter for general adult medical examination without abnormal findings: Secondary | ICD-10-CM | POA: Diagnosis not present

## 2024-05-22 DIAGNOSIS — J438 Other emphysema: Secondary | ICD-10-CM | POA: Diagnosis not present

## 2024-05-22 DIAGNOSIS — N1831 Chronic kidney disease, stage 3a: Secondary | ICD-10-CM | POA: Diagnosis not present

## 2024-05-22 DIAGNOSIS — E7800 Pure hypercholesterolemia, unspecified: Secondary | ICD-10-CM | POA: Diagnosis not present

## 2024-05-22 DIAGNOSIS — F419 Anxiety disorder, unspecified: Secondary | ICD-10-CM | POA: Diagnosis not present

## 2024-05-22 DIAGNOSIS — F5101 Primary insomnia: Secondary | ICD-10-CM | POA: Diagnosis not present

## 2024-05-27 ENCOUNTER — Ambulatory Visit (HOSPITAL_COMMUNITY)
Admission: RE | Admit: 2024-05-27 | Discharge: 2024-05-27 | Disposition: A | Discharge status: Home / Self Care | Payer: Self-pay | Source: Ambulatory Visit | Attending: Internal Medicine | Admitting: Internal Medicine

## 2024-05-27 ENCOUNTER — Inpatient Hospital Stay: Attending: Internal Medicine

## 2024-05-27 DIAGNOSIS — I700 Atherosclerosis of aorta: Secondary | ICD-10-CM | POA: Insufficient documentation

## 2024-05-27 DIAGNOSIS — C3490 Malignant neoplasm of unspecified part of unspecified bronchus or lung: Secondary | ICD-10-CM

## 2024-05-27 DIAGNOSIS — C3412 Malignant neoplasm of upper lobe, left bronchus or lung: Secondary | ICD-10-CM | POA: Diagnosis not present

## 2024-05-27 DIAGNOSIS — Z79899 Other long term (current) drug therapy: Secondary | ICD-10-CM | POA: Insufficient documentation

## 2024-05-27 DIAGNOSIS — J90 Pleural effusion, not elsewhere classified: Secondary | ICD-10-CM | POA: Diagnosis not present

## 2024-05-27 DIAGNOSIS — R918 Other nonspecific abnormal finding of lung field: Secondary | ICD-10-CM | POA: Diagnosis not present

## 2024-05-27 LAB — CBC WITH DIFFERENTIAL (CANCER CENTER ONLY)
Abs Immature Granulocytes: 0.05 K/uL (ref 0.00–0.07)
Basophils Absolute: 0 K/uL (ref 0.0–0.1)
Basophils Relative: 0 %
Eosinophils Absolute: 0 K/uL (ref 0.0–0.5)
Eosinophils Relative: 0 %
HCT: 38.8 % — ABNORMAL LOW (ref 39.0–52.0)
Hemoglobin: 13.9 g/dL (ref 13.0–17.0)
Immature Granulocytes: 0 %
Lymphocytes Relative: 12 %
Lymphs Abs: 1.5 K/uL (ref 0.7–4.0)
MCH: 36.7 pg — ABNORMAL HIGH (ref 26.0–34.0)
MCHC: 35.8 g/dL (ref 30.0–36.0)
MCV: 102.4 fL — ABNORMAL HIGH (ref 80.0–100.0)
Monocytes Absolute: 0.2 K/uL (ref 0.1–1.0)
Monocytes Relative: 2 %
Neutro Abs: 11.1 K/uL — ABNORMAL HIGH (ref 1.7–7.7)
Neutrophils Relative %: 86 %
Platelet Count: 388 K/uL (ref 150–400)
RBC: 3.79 MIL/uL — ABNORMAL LOW (ref 4.22–5.81)
RDW: 12.4 % (ref 11.5–15.5)
WBC Count: 12.8 K/uL — ABNORMAL HIGH (ref 4.0–10.5)
nRBC: 0 % (ref 0.0–0.2)

## 2024-05-27 LAB — CMP (CANCER CENTER ONLY)
ALT: 16 U/L (ref 0–44)
AST: 18 U/L (ref 15–41)
Albumin: 4.1 g/dL (ref 3.5–5.0)
Alkaline Phosphatase: 88 U/L (ref 38–126)
Anion gap: 8 (ref 5–15)
BUN: 21 mg/dL (ref 8–23)
CO2: 30 mmol/L (ref 22–32)
Calcium: 9.8 mg/dL (ref 8.9–10.3)
Chloride: 96 mmol/L — ABNORMAL LOW (ref 98–111)
Creatinine: 1.52 mg/dL — ABNORMAL HIGH (ref 0.61–1.24)
GFR, Estimated: 49 mL/min — ABNORMAL LOW (ref >60)
Glucose, Bld: 115 mg/dL — ABNORMAL HIGH (ref 70–99)
Potassium: 4.3 mmol/L (ref 3.5–5.1)
Sodium: 134 mmol/L — ABNORMAL LOW (ref 135–145)
Total Bilirubin: 0.4 mg/dL (ref 0.0–1.2)
Total Protein: 7.7 g/dL (ref 6.5–8.1)

## 2024-05-27 MED ORDER — IOHEXOL 300 MG/ML  SOLN
75.0000 mL | Freq: Once | INTRAMUSCULAR | Status: AC | PRN
  Administered 2024-05-27: 75 mL via INTRAVENOUS

## 2024-05-27 MED ORDER — SODIUM CHLORIDE (PF) 0.9 % IJ SOLN
INTRAMUSCULAR | Status: AC
  Filled 2024-05-27: qty 50

## 2024-06-03 ENCOUNTER — Inpatient Hospital Stay: Attending: Internal Medicine | Admitting: Internal Medicine

## 2024-06-03 ENCOUNTER — Inpatient Hospital Stay

## 2024-06-03 VITALS — BP 136/74 | HR 96 | Temp 97.0°F | Resp 17 | Ht 71.0 in | Wt 175.7 lb

## 2024-06-03 DIAGNOSIS — Z923 Personal history of irradiation: Secondary | ICD-10-CM | POA: Insufficient documentation

## 2024-06-03 DIAGNOSIS — I129 Hypertensive chronic kidney disease with stage 1 through stage 4 chronic kidney disease, or unspecified chronic kidney disease: Secondary | ICD-10-CM | POA: Insufficient documentation

## 2024-06-03 DIAGNOSIS — Z85528 Personal history of other malignant neoplasm of kidney: Secondary | ICD-10-CM | POA: Insufficient documentation

## 2024-06-03 DIAGNOSIS — Z5111 Encounter for antineoplastic chemotherapy: Secondary | ICD-10-CM | POA: Insufficient documentation

## 2024-06-03 DIAGNOSIS — N1832 Chronic kidney disease, stage 3b: Secondary | ICD-10-CM | POA: Diagnosis not present

## 2024-06-03 DIAGNOSIS — C3432 Malignant neoplasm of lower lobe, left bronchus or lung: Secondary | ICD-10-CM

## 2024-06-03 DIAGNOSIS — Z905 Acquired absence of kidney: Secondary | ICD-10-CM | POA: Insufficient documentation

## 2024-06-03 DIAGNOSIS — Z79899 Other long term (current) drug therapy: Secondary | ICD-10-CM | POA: Insufficient documentation

## 2024-06-03 DIAGNOSIS — C3412 Malignant neoplasm of upper lobe, left bronchus or lung: Secondary | ICD-10-CM | POA: Diagnosis not present

## 2024-06-03 MED ORDER — FOLIC ACID 1 MG PO TABS
1.0000 mg | ORAL_TABLET | Freq: Every day | ORAL | 3 refills | Status: AC
Start: 2024-06-03 — End: ?

## 2024-06-03 MED ORDER — ONDANSETRON HCL 8 MG PO TABS
8.0000 mg | ORAL_TABLET | Freq: Three times a day (TID) | ORAL | 1 refills | Status: AC | PRN
Start: 2024-06-03 — End: ?

## 2024-06-03 MED ORDER — CYANOCOBALAMIN 1000 MCG/ML IJ SOLN
1000.0000 ug | Freq: Once | INTRAMUSCULAR | Status: AC
  Administered 2024-06-03: 1000 ug via INTRAMUSCULAR
  Filled 2024-06-03: qty 1

## 2024-06-03 MED ORDER — PROCHLORPERAZINE MALEATE 10 MG PO TABS
10.0000 mg | ORAL_TABLET | Freq: Four times a day (QID) | ORAL | 1 refills | Status: AC | PRN
Start: 2024-06-03 — End: ?

## 2024-06-03 NOTE — Progress Notes (Signed)
 START ON PATHWAY REGIMEN - Non-Small Cell Lung     A cycle is every 21 days:     Pembrolizumab      Pemetrexed      Carboplatin   **Always confirm dose/schedule in your pharmacy ordering system**  Patient Characteristics: Stage IV Metastatic, Nonsquamous, Molecular Analysis Completed, Molecular Alteration Present and Targeted Therapy Exhausted, OR EGFR/KRAS/HER2/NRG1/c-Met Present and Standard Chemotherapy/Immunotherapy Planned, OR No Alteration Present, Initial  Chemotherapy/Immunotherapy, PS = 0, 1, No Alteration Present, No Alteration Present, Candidate for Immunotherapy, PD-L1 Expression Positive 1-49% (TPS) / Negative / Not Tested / Awaiting Test Results and Immunotherapy Candidate Therapeutic Status: Stage IV Metastatic Histology: Nonsquamous Cell Broad Molecular Profiling Status: Molecular Analysis Completed Molecular Analysis Results: No Alteration Present ECOG Performance Status: 1 Chemotherapy/Immunotherapy Line of Therapy: Initial Chemotherapy/Immunotherapy EGFR Exons 18-21 Mutation Testing Status: Completed and Negative c-Met Overexpression (EGFR Wildtype) Testing Status: Completed and Negative ALK Fusion/Rearrangement Testing Status: Completed and Negative BRAF V600 Mutation Testing Status: Completed and Negative KRAS G12C Mutation Testing Status: Completed and Negative MET Exon 14 Mutation Testing Status: Completed and Negative RET Fusion/Rearrangement Testing Status: Completed and Negative NRG1 Fusion/Rearrangement Testing Status: Completed and Negative HER2 Mutation Testing Status: Completed and Negative NTRK Fusion/Rearrangement Testing Status: Completed and Negative ROS1 Fusion/Rearrangement Testing Status: Completed and Negative Immunotherapy Candidate Status: Candidate for Immunotherapy PD-L1 Expression Status: PD-L1 Negative Intent of Therapy: Non-Curative / Palliative Intent, Discussed with Patient

## 2024-06-03 NOTE — Progress Notes (Signed)
 [facility] [phone]   Fax:([phone]  OFFICE PROGRESS NOTE  [name], MD [address] [place] [address]  DIAGNOSIS:  1) suspicious multifocal adenocarcinoma presented with hypermetabolic left lower lobe lung nodule suspicious for disease recurrence diagnosed in January 2025. 2)Stage IA (T1b, N0, M0) non-small cell lung cancer, adenocarcinoma.  The patient presented with a left lower lobe lung nodule.  She was diagnosed in February 2023.   Biomarker Findings Microsatellite status - Cannot Be Determined ? Tumor Mutational Burden - Cannot Be Determined Genomic Findings For a complete list of the genes assayed, please refer to the Appendix. KEAP1 W252C KRAS G12V STK11 E33* MUTYH G382D 7 Disease relevant genes with no reportable alterations: ALK, BRAF, EGFR, ERBB2, MET, RET, ROS1  PDL1 Expression: 0%  PRIOR THERAPY:  1) SBRT to the left lower lobe lung nodule under the care of Dr. Dewey completed on November 25, 2021 2) SBRT to the lingular nodule under the care of Dr. Dewey in February 2025  CURRENT THERAPY: Observation  INTERVAL HISTORY: Chris Holloway 73 y.o. male returns to the clinic today for 38-month follow-up visit by his friend Consuelo Simpers. Discussed the use of AI scribe software for clinical note transcription with the patient, who gave verbal consent to proceed.  History of Present Illness Chris Holloway is a 73 year old male with multifocal adenocarcinoma who presents for evaluation with repeat CT scan of the chest for restaging of his disease. He is accompanied by his wife.  He has been experiencing severe pain on the left side, radiating to the back shoulder blade, which persisted continuously for about a week and a half. He sought care at a walk-in clinic where an x-ray revealed an enlarged air bubble. He was prescribed prednisone, which provided significant relief, and he recently obtained a  refill for prednisone.  He has no current chest pain, breathing issues, or hemoptysis. However, he experiences a productive cough with yellowish sputum and takes Mucinex daily to manage this symptom.  He has not experienced any weight loss; in fact, he has gained five pounds recently. He maintains an active lifestyle, walking about three miles a day, and works in [employer], building services. He is concerned about the impact of treatment on his ability to work.    MEDICAL HISTORY: Past Medical History:  Diagnosis Date   Arthritis    hands back,    Chronic kidney disease    Per Dr. Arloa but not with Dr. Alvaro   Hypertension    lung ca 2023   Renal cancer (HCC) 2021   Stroke (HCC) 1998   mild stroke like symptoms lasted 5 min.    ALLERGIES:  has no known allergies.  MEDICATIONS:  Current Outpatient Medications  Medication Sig Dispense Refill   ALPRAZolam  (XANAX ) 1 MG tablet Take 1.5 mg by mouth at bedtime.     Ascorbic Acid (VITAMIN C) 1000 MG tablet Take 1,000 mg by mouth daily.     aspirin EC 81 MG tablet Take 81 mg by mouth at bedtime. Swallow whole.     buPROPion (WELLBUTRIN) 75 MG tablet Take 10 mg by mouth 2 (two) times daily.     cetirizine (ZYRTEC) 10 MG tablet Take 10 mg by mouth daily.     chlorthalidone (HYGROTON) 25 MG tablet Take 25 mg by mouth at bedtime.     guaifenesin (HUMIBID E) 400 MG TABS tablet Take 400 mg by  mouth in the morning and at bedtime.     methylPREDNISolone  (MEDROL  DOSEPAK) 4 MG TBPK tablet Use as instructed 21 tablet 0   Polyvinyl Alcohol-Povidone (CLEAR EYES ALL SEASONS) 5-6 MG/ML SOLN Place 1 drop into both eyes daily as needed (Dry eyes).     pravastatin (PRAVACHOL) 40 MG tablet Take 40 mg by mouth at bedtime.     promethazine -dextromethorphan (PROMETHAZINE -DM) 6.25-15 MG/5ML syrup 5 mL as needed Orally every 6 hrs for 7 days     sildenafil (VIAGRA) 100 MG tablet Take 100 mg by mouth daily as needed for erectile dysfunction.     valACYclovir (VALTREX)  500 MG tablet Take 500 mg by mouth daily.     No current facility-administered medications for this visit.    SURGICAL HISTORY:  Past Surgical History:  Procedure Laterality Date   BRONCHIAL BIOPSY  10/19/2021   Procedure: BRONCHIAL BIOPSIES;  Surgeon: Brenna Adine CROME, DO;  Location: [facility];  Service: Pulmonary;;   BRONCHIAL NEEDLE ASPIRATION BIOPSY  10/19/2021   Procedure: BRONCHIAL NEEDLE ASPIRATION BIOPSIES;  Surgeon: Brenna Adine CROME, DO;  Location: [facility];  Service: Pulmonary;;   FIDUCIAL MARKER PLACEMENT  10/19/2021   Procedure: FIDUCIAL MARKER PLACEMENT;  Surgeon: Brenna Adine CROME, DO;  Location: [facility];  Service: Pulmonary;;   HAND LIGAMENT RECONSTRUCTION Right 1990s   Saw injury   ROBOTIC ASSITED PARTIAL NEPHRECTOMY Left 07/15/2020   Procedure: XI ROBOTIC ASSITED RADICAL NEPHRECTOMY WITH INTRAOPERAATIVE ULTRASOUND;  Surgeon: Alvaro Hummer, MD;  Location: [facility];  Service: Urology;  Laterality: Left;  3 HRS   SHOULDER ARTHROSCOPY W/ ROTATOR CUFF REPAIR Left 08/2018   VIDEO BRONCHOSCOPY WITH RADIAL ENDOBRONCHIAL ULTRASOUND  10/19/2021   Procedure: RADIAL ENDOBRONCHIAL ULTRASOUND;  Surgeon: Brenna Adine CROME, DO;  Location: [facility];  Service: Pulmonary;;    REVIEW OF SYSTEMS:  Constitutional: positive for fatigue Eyes: negative Ears, nose, mouth, throat, and face: negative Respiratory: positive for cough, pleurisy/chest pain, and sputum Cardiovascular: negative Gastrointestinal: negative Genitourinary:negative Integument/breast: negative Hematologic/lymphatic: negative Musculoskeletal:negative Neurological: negative Behavioral/Psych: negative Endocrine: negative Allergic/Immunologic: negative   PHYSICAL EXAMINATION: General appearance: alert, cooperative, fatigued, and no distress Head: Normocephalic, without obvious abnormality, atraumatic Neck: no adenopathy, no JVD, supple, symmetrical, trachea midline, and thyroid  not enlarged, symmetric, no  tenderness/mass/nodules Lymph nodes: Cervical, supraclavicular, and axillary nodes normal. Resp: clear to auscultation bilaterally Back: symmetric, no curvature. ROM normal. No CVA tenderness. Cardio: regular rate and rhythm, S1, S2 normal, no murmur, click, rub or gallop GI: soft, non-tender; bowel sounds normal; no masses,  no organomegaly Extremities: extremities normal, atraumatic, no cyanosis or edema Neurologic: Alert and oriented X 3, normal strength and tone. Normal symmetric reflexes. Normal coordination and gait  ECOG PERFORMANCE STATUS: 1 - Symptomatic but completely ambulatory  Blood pressure 136/74, pulse 96, temperature (!) 97 F (36.1 C), resp. rate 17, height 5' 11 (1.803 m), weight 175 lb 11.2 oz (79.7 kg), SpO2 99%.  LABORATORY DATA: Lab Results  Component Value Date   WBC 12.8 (H) 05/27/2024   HGB 13.9 05/27/2024   HCT 38.8 (L) 05/27/2024   MCV 102.4 (H) 05/27/2024   PLT 388 05/27/2024      Chemistry      Component Value Date/Time   NA 134 (L) 05/27/2024 1226   K 4.3 05/27/2024 1226   CL 96 (L) 05/27/2024 1226   CO2 30 05/27/2024 1226   BUN 21 05/27/2024 1226   CREATININE 1.52 (H) 05/27/2024 1226      Component Value Date/Time   CALCIUM  9.8 05/27/2024 1226   ALKPHOS 88 05/27/2024 1226   AST 18 05/27/2024 1226   ALT 16 05/27/2024 1226   BILITOT 0.4 05/27/2024 1226       RADIOGRAPHIC STUDIES: CT Chest W Contrast Result Date: 05/28/2024 CLINICAL DATA:  Non-small cell lung cancer (NSCLC), staging. * Tracking Code: BO * EXAM: CT CHEST WITH CONTRAST TECHNIQUE: Multidetector CT imaging of the chest was performed during intravenous contrast administration. RADIATION DOSE REDUCTION: This exam was performed according to the departmental dose-optimization program which includes automated exposure control, adjustment of the mA and/or kV according to patient size and/or use of iterative reconstruction technique. CONTRAST:  75mL OMNIPAQUE  IOHEXOL  300 MG/ML  SOLN  COMPARISON:  CT scan chest from 01/23/2024. FINDINGS: Cardiovascular: Normal cardiac size. No pericardial effusion. No aortic aneurysm. There are coronary artery calcifications, in keeping with coronary artery disease. There are also mild-to-moderate peripheral atherosclerotic vascular calcifications of thoracic aorta and its major branches. Mediastinum/Nodes: Visualized thyroid  gland appears grossly unremarkable. No solid / cystic mediastinal masses. The esophagus is nondistended precluding optimal assessment. No axillary, mediastinal or hilar lymphadenopathy by size criteria. Lungs/Pleura: The central tracheo-bronchial tree is patent. Redemonstration of pre-existing heterogeneous mass in the left inferior hilum abutting the left inferomedial mediastinal pleura which contains small fiducial marker along the anterior aspect. The mass currently measures 1.5 x 4.4 cm, which previously measured up to 1.4 x 3.6 cm, when remeasured in similar fashion. There is associated small left pleural effusion, which appears slightly increased since the prior study. There are multiple irregular nodules predominantly in the left hemithorax which abut the visceral pleura and has increased in size and number since the prior study and highly concerning for worsening pleural metastases. No consolidation, lung collapse or pneumothorax. No right pleural effusion. Upper Abdomen: Visualized upper abdominal viscera within normal limits. Musculoskeletal: The visualized soft tissues of the chest wall are grossly unremarkable. No suspicious osseous lesions. There are mild multilevel degenerative changes in the visualized spine. IMPRESSION: 1. There is slight interval increase in the size of left inferior hilar mass, as described above. There is also interval increase in the size and number of multiple pleural-based nodules in the left hemithorax, highly concerning for worsening pleural metastases. There is also interval increase in the size of  small left pleural effusion. 2. Multiple other nonacute observations, as described above. Aortic Atherosclerosis (ICD10-I70.0). Electronically Signed   By: Ree Molt M.D.   On: 05/28/2024 10:30    ASSESSMENT AND PLAN: This is a very pleasant 73 years old white male diagnosed with a stage Ia (T1b, N0, M0) non-small cell lung cancer presented with left lower lobe lung nodule diagnosed in February 2023 status post SBRT to the left lower lobe lung nodule completed on November 25, 2021. He has been in observation since that time. He had repeat CT scan of the chest performed recently.  Unfortunately his scan showed progressive left lung nodularities suspicious for recurrent/metastatic disease. This was followed by a PET scan and it showed a 2.0 x 0.9 cm nodule anteriorly in the lingula with maximum SUV of 2.5 and previously had SUV of 1.6 and suspicious for malignancy. He was treated with SBRT to the lingular nodule under the care of Dr. Dewey February 2025. He had repeat CT scan of the chest performed recently.  I personally independently reviewed the scan and discussed the result with the patient and his friend.  The scan showed interval increase in the size and number of multiple pleural-based nodules in the  left hemothorax highly concerning for worsening pleural metastasis.  There was also interval increase in the size of the small left pleural effusion. I had a lengthy discussion with the patient and his friend about his current condition and treatment options.  I explained to the patient that he has incurable condition and all the treatment will be of palliative nature with the goal of treatment for prolongation of his survival and palliation of his symptoms. Assessment and Plan Assessment & Plan Progressive multifocal adenocarcinoma of the left lung Progressive multifocal adenocarcinoma of the left lung with multiple hypermetabolic nodules, increasing in size since the last scan in May. The disease  is not amenable to radiation due to the number of nodules. Chemotherapy combined with immunotherapy is recommended to shrink the nodules or prevent further growth. He is concerned about the side effects of chemotherapy, particularly fatigue, but is willing to proceed with treatment. The treatment plan is adjusted for his chronic kidney disease to minimize renal impact. Without treatment, the prognosis is approximately six to nine months, while treatment could extend life expectancy to around two years. Immune therapy alone is not an option due to negative PD-L1 expression. - Initiate chemotherapy and immunotherapy regimen next Thursday. - Administer B12 injection today as part of treatment preparation. - Prescribe folic acid to be taken daily. - Prescribe nausea medication for use as needed. - Schedule a chemotherapy class for patient education. - Monitor kidney function with weekly labs during the first 12 weeks of treatment. - Adjust chemotherapy dosage based on kidney function. - Ensure hydration and avoid NSAIDs like ibuprofen and Aleve.  Chronic kidney disease stage 3b, status post left nephrectomy for renal cell carcinoma Chronic kidney disease stage 3b with a current kidney filtration rate of 49, which is above the threshold for chemotherapy administration. The treatment plan for adenocarcinoma is adjusted to account for renal function, with a reduced dose of chemotherapy to minimize renal impact. Regular monitoring of kidney function is necessary to ensure safety during treatment. - Monitor kidney function with weekly labs during the first 12 weeks of treatment. - Adjust chemotherapy dosage based on kidney function. - Ensure hydration and avoid NSAIDs like ibuprofen and Aleve.  Chronic left chest and shoulder pain Chronic left chest and shoulder pain, previously managed with prednisone, which provided relief. The pain is likely related to the progression of lung cancer, with nodules close to  the lung surface causing discomfort. Prednisone is not recommended during chemotherapy treatment.  Chronic cough with sputum production Chronic cough with yellowish sputum production. He takes Mucinex daily, which may help manage symptoms. He was advised to call immediately if he has any other concerning symptoms in the interval.  The patient voices understanding of current disease status and treatment options and is in agreement with the current care plan.  All questions were answered. The patient knows to call the clinic with any problems, questions or concerns. We can certainly see the patient much sooner if necessary.  The total time spent in the appointment was 55 minutes.  Disclaimer: This note was dictated with voice recognition software. Similar sounding words can inadvertently be transcribed and may not be corrected upon review.

## 2024-06-05 ENCOUNTER — Telehealth: Payer: Self-pay

## 2024-06-05 ENCOUNTER — Encounter: Payer: Self-pay | Admitting: Internal Medicine

## 2024-06-05 DIAGNOSIS — I878 Other specified disorders of veins: Secondary | ICD-10-CM

## 2024-06-05 DIAGNOSIS — C3432 Malignant neoplasm of lower lobe, left bronchus or lung: Secondary | ICD-10-CM

## 2024-06-05 NOTE — Telephone Encounter (Signed)
 Pt called to see about getting PAC placed after first treatment. Order placed, pt aware to look out for call to have it scheduled. Pt was concerned about what to do after he receives treatment, if he gets sick or cannot drink as he should. Notified pt of [facility] clinic, home nausea meds and to call us  if he is having difficulty after treatment.

## 2024-06-06 ENCOUNTER — Other Ambulatory Visit: Payer: Self-pay

## 2024-06-06 ENCOUNTER — Encounter: Payer: Self-pay | Admitting: Internal Medicine

## 2024-06-06 NOTE — Progress Notes (Signed)
 Pharmacist Chemotherapy Monitoring - Initial Assessment    Anticipated start date: 06/13/24   The following has been reviewed per standard work regarding the patient's treatment regimen: The patient's diagnosis, treatment plan and drug doses, and organ/hematologic function Lab orders and baseline tests specific to treatment regimen  The treatment plan start date, drug sequencing, and pre-medications Prior authorization status  Patient's documented medication list, including drug-drug interaction screen and prescriptions for anti-emetics and supportive care specific to the treatment regimen The drug concentrations, fluid compatibility, administration routes, and timing of the medications to be used The patient's access for treatment and lifetime cumulative dose history, if applicable  The patient's medication allergies and previous infusion related reactions, if applicable   Changes made to treatment plan:  N/A  Follow up needed:  [facility] placement scheduled 06/18/24  Chris Holloway, RPH, 06/06/2024  11:40 AM

## 2024-06-09 ENCOUNTER — Other Ambulatory Visit: Payer: Self-pay

## 2024-06-12 ENCOUNTER — Other Ambulatory Visit: Payer: Self-pay | Admitting: Medical Oncology

## 2024-06-12 ENCOUNTER — Other Ambulatory Visit: Payer: Self-pay

## 2024-06-12 ENCOUNTER — Inpatient Hospital Stay

## 2024-06-12 DIAGNOSIS — Z95828 Presence of other vascular implants and grafts: Secondary | ICD-10-CM

## 2024-06-12 MED ORDER — LIDOCAINE-PRILOCAINE 2.5-2.5 % EX CREA: 1.0000 | TOPICAL_CREAM | CUTANEOUS | 0 refills | Status: DC | PRN

## 2024-06-12 MED FILL — Fosaprepitant Dimeglumine For IV Infusion 150 MG (Base Eq): INTRAVENOUS | Qty: 5 | Status: AC

## 2024-06-13 ENCOUNTER — Inpatient Hospital Stay

## 2024-06-13 ENCOUNTER — Encounter: Payer: Self-pay | Admitting: Internal Medicine

## 2024-06-13 VITALS — BP 125/70 | HR 92 | Temp 97.7°F | Resp 20 | Ht 71.0 in | Wt 178.0 lb

## 2024-06-13 DIAGNOSIS — C3432 Malignant neoplasm of lower lobe, left bronchus or lung: Secondary | ICD-10-CM

## 2024-06-13 DIAGNOSIS — Z5111 Encounter for antineoplastic chemotherapy: Secondary | ICD-10-CM | POA: Diagnosis not present

## 2024-06-13 LAB — CBC WITH DIFFERENTIAL (CANCER CENTER ONLY)
Abs Immature Granulocytes: 0.08 K/uL — ABNORMAL HIGH (ref 0.00–0.07)
Basophils Absolute: 0.1 K/uL (ref 0.0–0.1)
Basophils Relative: 1 %
Eosinophils Absolute: 0.3 K/uL (ref 0.0–0.5)
Eosinophils Relative: 3 %
HCT: 35.7 % — ABNORMAL LOW (ref 39.0–52.0)
Hemoglobin: 13 g/dL (ref 13.0–17.0)
Immature Granulocytes: 1 %
Lymphocytes Relative: 25 %
Lymphs Abs: 2.3 K/uL (ref 0.7–4.0)
MCH: 36.3 pg — ABNORMAL HIGH (ref 26.0–34.0)
MCHC: 36.4 g/dL — ABNORMAL HIGH (ref 30.0–36.0)
MCV: 99.7 fL (ref 80.0–100.0)
Monocytes Absolute: 0.5 K/uL (ref 0.1–1.0)
Monocytes Relative: 5 %
Neutro Abs: 6 K/uL (ref 1.7–7.7)
Neutrophils Relative %: 65 %
Platelet Count: 312 K/uL (ref 150–400)
RBC: 3.58 MIL/uL — ABNORMAL LOW (ref 4.22–5.81)
RDW: 12.2 % (ref 11.5–15.5)
WBC Count: 9.2 K/uL (ref 4.0–10.5)
nRBC: 0 % (ref 0.0–0.2)

## 2024-06-13 LAB — CMP (CANCER CENTER ONLY)
ALT: 13 U/L (ref 0–44)
AST: 21 U/L (ref 15–41)
Albumin: 3.9 g/dL (ref 3.5–5.0)
Alkaline Phosphatase: 84 U/L (ref 38–126)
Anion gap: 8 (ref 5–15)
BUN: 17 mg/dL (ref 8–23)
CO2: 26 mmol/L (ref 22–32)
Calcium: 9.8 mg/dL (ref 8.9–10.3)
Chloride: 95 mmol/L — ABNORMAL LOW (ref 98–111)
Creatinine: 1.43 mg/dL — ABNORMAL HIGH (ref 0.61–1.24)
GFR, Estimated: 52 mL/min — ABNORMAL LOW (ref >60)
Glucose, Bld: 101 mg/dL — ABNORMAL HIGH (ref 70–99)
Potassium: 4.1 mmol/L (ref 3.5–5.1)
Sodium: 129 mmol/L — ABNORMAL LOW (ref 135–145)
Total Bilirubin: 0.6 mg/dL (ref 0.0–1.2)
Total Protein: 7.5 g/dL (ref 6.5–8.1)

## 2024-06-13 LAB — TSH: TSH: 1.39 u[IU]/mL (ref 0.350–4.500)

## 2024-06-13 MED ORDER — SODIUM CHLORIDE 0.9 % IV SOLN
150.0000 mg | Freq: Once | INTRAVENOUS | Status: AC
  Administered 2024-06-13: 150 mg via INTRAVENOUS
  Filled 2024-06-13: qty 150

## 2024-06-13 MED ORDER — SODIUM CHLORIDE 0.9 % IV SOLN
375.0000 mg/m2 | Freq: Once | INTRAVENOUS | Status: AC
  Administered 2024-06-13: 800 mg via INTRAVENOUS
  Filled 2024-06-13: qty 20

## 2024-06-13 MED ORDER — DEXAMETHASONE SOD PHOSPHATE PF 10 MG/ML IJ SOLN
10.0000 mg | Freq: Once | INTRAMUSCULAR | Status: AC
  Administered 2024-06-13: 10 mg via INTRAVENOUS

## 2024-06-13 MED ORDER — PALONOSETRON HCL INJECTION 0.25 MG/5ML
0.2500 mg | Freq: Once | INTRAVENOUS | Status: AC
  Administered 2024-06-13: 0.25 mg via INTRAVENOUS
  Filled 2024-06-13: qty 5

## 2024-06-13 MED ORDER — SODIUM CHLORIDE 0.9 % IV SOLN
200.0000 mg | Freq: Once | INTRAVENOUS | Status: AC
  Administered 2024-06-13: 200 mg via INTRAVENOUS
  Filled 2024-06-13: qty 200

## 2024-06-13 MED ORDER — SODIUM CHLORIDE 0.9 % IV SOLN
300.8000 mg | Freq: Once | INTRAVENOUS | Status: AC
  Administered 2024-06-13: 300 mg via INTRAVENOUS
  Filled 2024-06-13: qty 30

## 2024-06-13 MED ORDER — SODIUM CHLORIDE 0.9 % IV SOLN: INTRAVENOUS | Status: DC

## 2024-06-13 NOTE — Patient Instructions (Signed)
 [facility] - A DEPT OF [facility]  Discharge Instructions: Thank you for choosing [facility] to provide your oncology and hematology care.   If you have a lab appointment with the Cancer Center, please go directly to the Cancer Center and check in at the registration area.   Wear comfortable clothing and clothing appropriate for easy access to any Portacath or PICC line.   We strive to give you quality time with your provider. You may need to reschedule your appointment if you arrive late (15 or more minutes).  Arriving late affects you and other patients whose appointments are after yours.  Also, if you miss three or more appointments without notifying the office, you may be dismissed from the clinic at the provider's discretion.      For prescription refill requests, have your pharmacy contact our office and allow 72 hours for refills to be completed.    Today you received the following chemotherapy and/or immunotherapy agents : Keytruda (pembrolizumab), Alimta (Pemetrexed), Carboplatin (paraplatin),     To help prevent nausea and vomiting after your treatment, we encourage you to take your nausea medication as directed.  BELOW ARE SYMPTOMS THAT SHOULD BE REPORTED IMMEDIATELY: *FEVER GREATER THAN 100.4 F (38 C) OR HIGHER *CHILLS OR SWEATING *NAUSEA AND VOMITING THAT IS NOT CONTROLLED WITH YOUR NAUSEA MEDICATION *UNUSUAL SHORTNESS OF BREATH *UNUSUAL BRUISING OR BLEEDING *URINARY PROBLEMS (pain or burning when urinating, or frequent urination) *BOWEL PROBLEMS (unusual diarrhea, constipation, pain near the anus) TENDERNESS IN MOUTH AND THROAT WITH OR WITHOUT PRESENCE OF ULCERS (sore throat, sores in mouth, or a toothache) UNUSUAL RASH, SWELLING OR PAIN  UNUSUAL VAGINAL DISCHARGE OR ITCHING   Items with * indicate a potential emergency and should be followed up as soon as possible or go to the Emergency Department if any problems should  occur.  Please show the CHEMOTHERAPY ALERT CARD or IMMUNOTHERAPY ALERT CARD at check-in to the Emergency Department and triage nurse.  Should you have questions after your visit or need to cancel or reschedule your appointment, please contact [facility] - A DEPT OF [facility]  Dept: [phone]  and follow the prompts.  Office hours are 8:00 a.m. to 4:30 p.m. Monday - Friday. Please note that voicemails left after 4:00 p.m. may not be returned until the following business day.  We are closed weekends and major holidays. You have access to a nurse at all times for urgent questions. Please call the main number to the clinic Dept: [phone] and follow the prompts.   For any non-urgent questions, you may also contact your provider using MyChart. We now offer e-Visits for anyone 38 and older to request care online for non-urgent symptoms. For details visit [email or web address].   Also download the MyChart app! Go to the app store, search MyChart, open the app, select [facility], and log in with your MyChart username and password.

## 2024-06-14 ENCOUNTER — Other Ambulatory Visit: Payer: Self-pay | Admitting: Radiology

## 2024-06-14 LAB — T4: T4, Total: 8.6 ug/dL (ref 4.5–12.0)

## 2024-06-17 ENCOUNTER — Encounter: Payer: Self-pay | Admitting: Internal Medicine

## 2024-06-17 ENCOUNTER — Telehealth: Payer: Self-pay | Admitting: *Deleted

## 2024-06-17 NOTE — Telephone Encounter (Signed)
  error

## 2024-06-17 NOTE — Progress Notes (Signed)
 Returned call from patient whom left a voicemail after receiving my card per my request. Introduced myself as Dance movement psychotherapist and to ask if he had any financial questions or concerns regarding his treatment. He states he pays monthly towards his bills from [facility] but he received a text message with a balance and then one from collections. Advised him to contact the number on the bill to have resolved. He verbalized understanding.  Screened for [employer] and he does not qualify to apply.  He has my card for any additional financial questions or concerns.

## 2024-06-17 NOTE — H&P (Signed)
 Chief Complaint: Progressive multifocal adenocarcinoma of the left lung; poor venous access; referred for Port-A-Cath placement to assist with treatment  Referring Provider(s): Mohamed,M  Supervising Physician: Hughes Simmonds  Patient Status: [facility] - Out-pt  History of Present Illness: Chris Holloway is a 73 y.o. male with past medical history significant for arthritis, chronic kidney disease, hypertension, left renal cancer, remote mild stroke/TIA as well as progressive multifocal adenocarcinoma of the left lung.  He has poor venous access and is scheduled today for Port-A-Cath placement to assist with palliative treatment.  *** Patient is Full Code  Past Medical History:  Diagnosis Date   Arthritis    hands back,    Chronic kidney disease    Per Dr. Arloa but not with Dr. Alvaro   Hypertension    lung ca 2023   Renal cancer (HCC) 2021   Stroke (HCC) 1998   mild stroke like symptoms lasted 5 min.    Past Surgical History:  Procedure Laterality Date   BRONCHIAL BIOPSY  10/19/2021   Procedure: BRONCHIAL BIOPSIES;  Surgeon: Brenna Adine CROME, DO;  Location: [facility];  Service: Pulmonary;;   BRONCHIAL NEEDLE ASPIRATION BIOPSY  10/19/2021   Procedure: BRONCHIAL NEEDLE ASPIRATION BIOPSIES;  Surgeon: Brenna Adine CROME, DO;  Location: [facility];  Service: Pulmonary;;   FIDUCIAL MARKER PLACEMENT  10/19/2021   Procedure: FIDUCIAL MARKER PLACEMENT;  Surgeon: Brenna Adine CROME, DO;  Location: [facility];  Service: Pulmonary;;   HAND LIGAMENT RECONSTRUCTION Right 1990s   Saw injury   ROBOTIC ASSITED PARTIAL NEPHRECTOMY Left 07/15/2020   Procedure: XI ROBOTIC ASSITED RADICAL NEPHRECTOMY WITH INTRAOPERAATIVE ULTRASOUND;  Surgeon: Alvaro Hummer, MD;  Location: [facility];  Service: Urology;  Laterality: Left;  3 HRS   SHOULDER ARTHROSCOPY W/ ROTATOR CUFF REPAIR Left 08/2018   VIDEO BRONCHOSCOPY WITH RADIAL ENDOBRONCHIAL ULTRASOUND  10/19/2021   Procedure: RADIAL ENDOBRONCHIAL  ULTRASOUND;  Surgeon: Brenna Adine CROME, DO;  Location: [facility];  Service: Pulmonary;;    Allergies: Patient has no known allergies.  Medications: Prior to Admission medications   Medication Sig Start Date End Date Taking? Authorizing Provider  ALPRAZolam  (XANAX ) 1 MG tablet Take 1.5 mg by mouth at bedtime. 07/05/20   [provider]  Ascorbic Acid (VITAMIN C) 1000 MG tablet Take 1,000 mg by mouth daily. Patient not taking: Reported on 06/03/2024    [provider]  aspirin EC 81 MG tablet Take 81 mg by mouth at bedtime. Swallow whole.    [provider]  buPROPion (WELLBUTRIN) 75 MG tablet Take 10 mg by mouth 2 (two) times daily.    [provider]  cetirizine (ZYRTEC) 10 MG tablet Take 10 mg by mouth daily.    [provider]  chlorthalidone (HYGROTON) 25 MG tablet Take 25 mg by mouth at bedtime. 04/30/20   [provider]  folic acid (FOLVITE) 1 MG tablet Take 1 tablet (1 mg total) by mouth daily. Start 7 days before pemetrexed chemotherapy. Continue until 21 days after pemetrexed completed. 06/03/24   Sherrod Sherrod, MD  guaifenesin (HUMIBID E) 400 MG TABS tablet Take 400 mg by mouth in the morning and at bedtime.    [provider]  lidocaine -prilocaine (EMLA) cream Apply 1 Application topically as needed. Apply 1-2 hours before lab appointment. 06/12/24   Sherrod Sherrod, MD  methylPREDNISolone  (MEDROL  DOSEPAK) 4 MG TBPK tablet Use as instructed 01/30/24   Sherrod Sherrod, MD  ondansetron  (ZOFRAN ) 8 MG tablet Take 1 tablet (8 mg total) by  mouth every 8 (eight) hours as needed for nausea or vomiting. Start on the third day after carboplatin. 06/03/24   Sherrod Sherrod, MD  Polyvinyl Alcohol-Povidone (CLEAR EYES ALL SEASONS) 5-6 MG/ML SOLN Place 1 drop into both eyes daily as needed (Dry eyes).    [provider]  pravastatin (PRAVACHOL) 40 MG tablet Take 40 mg by mouth at bedtime. 04/30/20   [provider]   prochlorperazine (COMPAZINE) 10 MG tablet Take 1 tablet (10 mg total) by mouth every 6 (six) hours as needed for nausea or vomiting. 06/03/24   Sherrod Sherrod, MD  promethazine -dextromethorphan (PROMETHAZINE -DM) 6.25-15 MG/5ML syrup 5 mL as needed Orally every 6 hrs for 7 days 01/29/24   [provider]  sildenafil (VIAGRA) 100 MG tablet Take 100 mg by mouth daily as needed for erectile dysfunction. 01/12/20   [provider]  valACYclovir (VALTREX) 500 MG tablet Take 500 mg by mouth daily. 01/27/22   [provider]     No family history on file.  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   Drug use: Never   Sexual activity: Not on file  Other Topics Concern   Not on file  Social History Narrative   Not on file   Social Drivers of Health   Financial Resource Strain: Not on file  Food Insecurity: No Food Insecurity (10/11/2023)   Hunger Vital Sign    Worried About Running Out of Food in the Last Year: Never true    Ran Out of Food in the Last Year: Never true  Transportation Needs: No Transportation Needs (10/11/2023)   PRAPARE - Administrator, Civil Service (Medical): No    Lack of Transportation (Non-Medical): No  Physical Activity: Not on file  Stress: Not on file  Social Connections: Not on file       Review of Systems  Vital Signs:   Advance Care Plan: No documents on file  Physical Exam  Imaging: CT Chest  W Contrast Result Date: 05/28/2024 CLINICAL DATA:  Non-small cell lung cancer (NSCLC), staging. * Tracking Code: BO * EXAM: CT CHEST WITH CONTRAST TECHNIQUE: Multidetector CT imaging of the chest was performed during intravenous contrast administration. RADIATION DOSE REDUCTION: This exam was performed according to the departmental dose-optimization program which includes automated exposure control, adjustment of the mA and/or kV according to patient size and/or use of iterative reconstruction technique. CONTRAST:  75mL OMNIPAQUE  IOHEXOL  300 MG/ML  SOLN COMPARISON:  CT scan chest from 01/23/2024. FINDINGS: Cardiovascular: Normal cardiac size. No pericardial effusion. No aortic aneurysm. There are coronary artery calcifications, in keeping with coronary artery disease. There are also mild-to-moderate peripheral atherosclerotic vascular calcifications of thoracic aorta and its major branches. Mediastinum/Nodes: Visualized thyroid  gland appears grossly unremarkable. No solid / cystic mediastinal masses. The esophagus is nondistended precluding optimal assessment. No axillary, mediastinal or hilar  lymphadenopathy by size criteria. Lungs/Pleura: The central tracheo-bronchial tree is patent. Redemonstration of pre-existing heterogeneous mass in the left inferior hilum abutting the left inferomedial mediastinal pleura which contains small fiducial marker along the anterior aspect. The mass currently measures 1.5 x 4.4 cm, which previously measured up to 1.4 x 3.6 cm, when remeasured in similar fashion. There is associated small left pleural effusion, which appears slightly increased since the prior study. There are multiple irregular nodules predominantly in the left hemithorax which abut the visceral pleura and has increased in size and number since the prior study and highly concerning for worsening pleural metastases. No consolidation, lung collapse or pneumothorax. No right pleural effusion. Upper Abdomen: Visualized  upper abdominal viscera within normal limits. Musculoskeletal: The visualized soft tissues of the chest wall are grossly unremarkable. No suspicious osseous lesions. There are mild multilevel degenerative changes in the visualized spine. IMPRESSION: 1. There is slight interval increase in the size of left inferior hilar mass, as described above. There is also interval increase in the size and number of multiple pleural-based nodules in the left hemithorax, highly concerning for worsening pleural metastases. There is also interval increase in the size of small left pleural effusion. 2. Multiple other nonacute observations, as described above. Aortic Atherosclerosis (ICD10-I70.0). Electronically Signed   By: Ree Molt M.D.   On: 05/28/2024 10:30    Labs:  CBC: Recent Labs    09/07/23 0904 01/23/24 0933 05/27/24 1226 06/13/24 1346  WBC 7.0 9.1 12.8* 9.2  HGB 13.9 13.3 13.9 13.0  HCT 39.6 37.6* 38.8* 35.7*  PLT 320 373 388 312    COAGS: No results for input(s): INR, APTT in the last 8760 hours.  BMP: Recent Labs    09/07/23 0904 01/23/24 0933 05/27/24 1226 06/13/24 1346  NA 131* 136 134* 129*  K 3.9 4.0 4.3 4.1  CL 93* 100 96* 95*  CO2 32 28 30 26   GLUCOSE 81 89 115* 101*  BUN 21 18 21 17   CALCIUM 10.0 9.6 9.8 9.8  CREATININE 1.74* 1.58* 1.52* 1.43*  GFRNONAA 42* 47* 49* 52*    LIVER FUNCTION TESTS: Recent Labs    09/07/23 0904 01/23/24 0933 05/27/24 1226 06/13/24 1346  BILITOT 0.6 0.4 0.4 0.6  AST 23 21 18 21   ALT 18 14 16 13   ALKPHOS 84 80 88 84  PROT 7.6 7.5 7.7 7.5  ALBUMIN  3.9 4.0 4.1 3.9    TUMOR MARKERS: No results for input(s): AFPTM, CEA, CA199, CHROMGRNA in the last 8760 hours.  Assessment and Plan: 73 y.o. male with past medical history significant for arthritis, chronic kidney disease, hypertension, left renal cancer, remote mild stroke/TIA as well as progressive multifocal adenocarcinoma of the left lung.  He has poor venous access  and is scheduled today for Port-A-Cath placement to assist with palliative treatment.Risks and benefits of image guided port-a-catheter placement was discussed with the patient including, but not limited to bleeding, infection, pneumothorax, or fibrin sheath development and need for additional procedures.  All of the patient's questions were answered, patient is agreeable to proceed. Consent signed and in chart.    Thank you for allowing our service to participate in Chris Holloway 's care.  Electronically Signed: D. Franky Rakers, PA-C   06/17/2024, 4:54 PM      I spent a total of  20 minutes   in face to face in clinical consultation, greater than 50% of which was counseling/coordinating care for port a cath placement

## 2024-06-17 NOTE — Telephone Encounter (Signed)
 Returned pt. call in regards to compazine. He was taking Compazine daily and states it was interfering with his sleep. Instructed pt. to take prn and also informed him he could try the Zofran  prn. Informed pt. may be a side effect of the Decadron  and to let us  know if sleep interruption continues.

## 2024-06-18 ENCOUNTER — Other Ambulatory Visit: Payer: Self-pay

## 2024-06-18 ENCOUNTER — Encounter (HOSPITAL_COMMUNITY): Payer: Self-pay

## 2024-06-18 ENCOUNTER — Ambulatory Visit (HOSPITAL_COMMUNITY)
Admission: RE | Admit: 2024-06-18 | Discharge: 2024-06-18 | Disposition: A | Discharge status: Home / Self Care | Source: Ambulatory Visit | Attending: Internal Medicine | Admitting: Internal Medicine

## 2024-06-18 ENCOUNTER — Ambulatory Visit (HOSPITAL_COMMUNITY)
Admission: RE | Admit: 2024-06-18 | Discharge: 2024-06-18 | Disposition: A | Source: Ambulatory Visit | Attending: Internal Medicine

## 2024-06-18 DIAGNOSIS — F17210 Nicotine dependence, cigarettes, uncomplicated: Secondary | ICD-10-CM | POA: Insufficient documentation

## 2024-06-18 DIAGNOSIS — I878 Other specified disorders of veins: Secondary | ICD-10-CM

## 2024-06-18 DIAGNOSIS — Z452 Encounter for adjustment and management of vascular access device: Secondary | ICD-10-CM | POA: Diagnosis not present

## 2024-06-18 DIAGNOSIS — C3492 Malignant neoplasm of unspecified part of left bronchus or lung: Secondary | ICD-10-CM | POA: Diagnosis not present

## 2024-06-18 DIAGNOSIS — Z85528 Personal history of other malignant neoplasm of kidney: Secondary | ICD-10-CM | POA: Insufficient documentation

## 2024-06-18 DIAGNOSIS — N189 Chronic kidney disease, unspecified: Secondary | ICD-10-CM | POA: Diagnosis not present

## 2024-06-18 DIAGNOSIS — C3432 Malignant neoplasm of lower lobe, left bronchus or lung: Secondary | ICD-10-CM

## 2024-06-18 DIAGNOSIS — Z8673 Personal history of transient ischemic attack (TIA), and cerebral infarction without residual deficits: Secondary | ICD-10-CM | POA: Insufficient documentation

## 2024-06-18 DIAGNOSIS — I129 Hypertensive chronic kidney disease with stage 1 through stage 4 chronic kidney disease, or unspecified chronic kidney disease: Secondary | ICD-10-CM | POA: Insufficient documentation

## 2024-06-18 MED ORDER — MIDAZOLAM HCL 2 MG/2ML IJ SOLN
INTRAMUSCULAR | Status: AC
  Filled 2024-06-18: qty 2

## 2024-06-18 MED ORDER — FENTANYL CITRATE (PF) 100 MCG/2ML IJ SOLN
INTRAMUSCULAR | Status: AC
  Filled 2024-06-18: qty 2

## 2024-06-18 MED ORDER — FENTANYL CITRATE (PF) 100 MCG/2ML IJ SOLN
INTRAMUSCULAR | Status: AC | PRN
  Administered 2024-06-18: 50 ug via INTRAVENOUS

## 2024-06-18 MED ORDER — HEPARIN SOD (PORK) LOCK FLUSH 100 UNIT/ML IV SOLN: 500.0000 [IU] | Freq: Once | INTRAVENOUS | Status: DC

## 2024-06-18 MED ORDER — MIDAZOLAM HCL (PF) 2 MG/2ML IJ SOLN
INTRAMUSCULAR | Status: AC | PRN
  Administered 2024-06-18: 1 mg via INTRAVENOUS

## 2024-06-18 MED ORDER — LIDOCAINE-EPINEPHRINE 1 %-1:100000 IJ SOLN
INTRAMUSCULAR | Status: AC
  Filled 2024-06-18: qty 1

## 2024-06-18 MED ORDER — SODIUM CHLORIDE 0.9 % IV SOLN: INTRAVENOUS | Status: DC

## 2024-06-18 MED ORDER — HEPARIN SOD (PORK) LOCK FLUSH 100 UNIT/ML IV SOLN
INTRAVENOUS | Status: AC
  Filled 2024-06-18: qty 5

## 2024-06-18 MED ORDER — LIDOCAINE-EPINEPHRINE 1 %-1:100000 IJ SOLN
20.0000 mL | Freq: Once | INTRAMUSCULAR | Status: AC
Start: 2024-06-18 — End: 2024-06-18
  Administered 2024-06-18: 20 mL via INTRADERMAL

## 2024-06-18 NOTE — Progress Notes (Unsigned)
 [facility] OFFICE PROGRESS NOTE  [name], MD [address] [place] [address]  DIAGNOSIS: 1) suspicious multifocal adenocarcinoma presented with hypermetabolic left lower lobe lung nodule suspicious for disease recurrence diagnosed in January 2025. 2)Stage IA (T1b, N0, M0) non-small cell lung cancer, adenocarcinoma.  The patient presented with a left lower lobe lung nodule.  She was diagnosed in February 2023.    Biomarker Findings Microsatellite status - Cannot Be Determined ? Tumor Mutational Burden - Cannot Be Determined Genomic Findings For a complete list of the genes assayed, please refer to the Appendix. KEAP1 W252C KRAS G12V STK11 E33* MUTYH G382D 7 Disease relevant genes with no reportable alterations: ALK, BRAF, EGFR, ERBB2, MET, RET, ROS1   PDL1 Expression: 0%  PRIOR THERAPY: 1) SBRT to the left lower lobe lung nodule under the care of Dr. Dewey completed on November 25, 2021 2) SBRT to the lingular nodule under the care of Dr. Dewey in February 2025  CURRENT THERAPY: Palliative systemic chemotherapy with carboplatin for an AUC of 5, alimta 500 mg/m2, and keytruda 200 mg IV every 3 weeks. First dose on 06/13/24  INTERVAL HISTORY: Chris Holloway 73 y.o. male returns to the clinic for a follow up visit. The patient recently found to have disease progression. He was started on chemo/immunotherapy.n The patient is feeling well today without any concerning complaints. The patient continues to tolerate treatment with chemo/immunotherapy well without any adverse effects. Denies any fever, chills, night sweats, or weight loss. Denies any chest pain, shortness of breath,  or hemoptysis. He has a chronic cough. . He has mild dyspnea on exertion. ***MM gave steroids? He also takes promethazine . Denies any nausea, vomiting, diarrhea, or constipation. Denies any headache or visual changes. Denies any rashes or skin changes. Port.  The patient is  here today for evaluation and repeat blood work.    MEDICAL HISTORY: Past Medical History:  Diagnosis Date   Arthritis    hands back,    Chronic kidney disease    Per Dr. Arloa but not with Dr. Alvaro   Hypertension    lung ca 2023   Renal cancer (HCC) 2021   Stroke (HCC) 1998   mild stroke like symptoms lasted 5 min.    ALLERGIES:  has no known allergies.  MEDICATIONS:  Current Outpatient Medications  Medication Sig Dispense Refill   ALPRAZolam  (XANAX ) 1 MG tablet Take 1.5 mg by mouth at bedtime.     Ascorbic Acid (VITAMIN C) 1000 MG tablet Take 1,000 mg by mouth daily. (Patient not taking: Reported on 06/03/2024)     aspirin EC 81 MG tablet Take 81 mg by mouth at bedtime. Swallow whole.     buPROPion (WELLBUTRIN) 75 MG tablet Take 10 mg by mouth 2 (two) times daily.     cetirizine (ZYRTEC) 10 MG tablet Take 10 mg by mouth daily.     chlorthalidone (HYGROTON) 25 MG tablet Take 25 mg by mouth at bedtime.     folic acid (FOLVITE) 1 MG tablet Take 1 tablet (1 mg total) by mouth daily. Start 7 days before pemetrexed chemotherapy. Continue until 21 days after pemetrexed completed. 100 tablet 3   guaifenesin (HUMIBID E) 400 MG TABS tablet Take 400 mg by mouth in the morning and at bedtime.     lidocaine -prilocaine (EMLA) cream Apply 1 Application topically as needed. Apply 1-2 hours before lab appointment. 30 g 0   methylPREDNISolone  (MEDROL  DOSEPAK) 4 MG TBPK tablet  Use as instructed 21 tablet 0   ondansetron  (ZOFRAN ) 8 MG tablet Take 1 tablet (8 mg total) by mouth every 8 (eight) hours as needed for nausea or vomiting. Start on the third day after carboplatin. 30 tablet 1   Polyvinyl Alcohol-Povidone (CLEAR EYES ALL SEASONS) 5-6 MG/ML SOLN Place 1 drop into both eyes daily as needed (Dry eyes).     pravastatin (PRAVACHOL) 40 MG tablet Take 40 mg by mouth at bedtime.     prochlorperazine (COMPAZINE) 10 MG tablet Take 1 tablet (10 mg total) by mouth every 6 (six) hours as needed for  nausea or vomiting. 30 tablet 1   promethazine -dextromethorphan (PROMETHAZINE -DM) 6.25-15 MG/5ML syrup 5 mL as needed Orally every 6 hrs for 7 days     sildenafil (VIAGRA) 100 MG tablet Take 100 mg by mouth daily as needed for erectile dysfunction.     valACYclovir (VALTREX) 500 MG tablet Take 500 mg by mouth daily.     No current facility-administered medications for this visit.   Facility-Administered Medications Ordered in Other Visits  Medication Dose Route Frequency Provider Last Rate Last Admin   0.9 %  sodium chloride  infusion   Intravenous Continuous Allred, Darrell K, PA-C   Stopped at 06/18/24 1318   heparin lock flush 100 unit/mL  500 Units Intravenous Once Mugweru, Jon, MD        SURGICAL HISTORY:  Past Surgical History:  Procedure Laterality Date   BRONCHIAL BIOPSY  10/19/2021   Procedure: BRONCHIAL BIOPSIES;  Surgeon: Brenna Adine CROME, DO;  Location: [facility];  Service: Pulmonary;;   BRONCHIAL NEEDLE ASPIRATION BIOPSY  10/19/2021   Procedure: BRONCHIAL NEEDLE ASPIRATION BIOPSIES;  Surgeon: Brenna Adine CROME, DO;  Location: [facility];  Service: Pulmonary;;   FIDUCIAL MARKER PLACEMENT  10/19/2021   Procedure: FIDUCIAL MARKER PLACEMENT;  Surgeon: Brenna Adine CROME, DO;  Location: [facility];  Service: Pulmonary;;   HAND LIGAMENT RECONSTRUCTION Right 1990s   Saw injury   IR IMAGING GUIDED PORT INSERTION  06/18/2024   ROBOTIC ASSITED PARTIAL NEPHRECTOMY Left 07/15/2020   Procedure: XI ROBOTIC ASSITED RADICAL NEPHRECTOMY WITH INTRAOPERAATIVE ULTRASOUND;  Surgeon: Alvaro Hummer, MD;  Location: [facility];  Service: Urology;  Laterality: Left;  3 HRS   SHOULDER ARTHROSCOPY W/ ROTATOR CUFF REPAIR Left 08/2018   VIDEO BRONCHOSCOPY WITH RADIAL ENDOBRONCHIAL ULTRASOUND  10/19/2021   Procedure: RADIAL ENDOBRONCHIAL ULTRASOUND;  Surgeon: Brenna Adine CROME, DO;  Location: [facility];  Service: Pulmonary;;    REVIEW OF SYSTEMS:   Review of Systems  Constitutional: Negative for  appetite change, chills, fatigue, fever and unexpected weight change.  HENT:   Negative for mouth sores, nosebleeds, sore throat and trouble swallowing.   Eyes: Negative for eye problems and icterus.  Respiratory: Negative for cough, hemoptysis, shortness of breath and wheezing.   Cardiovascular: Negative for chest pain and leg swelling.  Gastrointestinal: Negative for abdominal pain, constipation, diarrhea, nausea and vomiting.  Genitourinary: Negative for bladder incontinence, difficulty urinating, dysuria, frequency and hematuria.   Musculoskeletal: Negative for back pain, gait problem, neck pain and neck stiffness.  Skin: Negative for itching and rash.  Neurological: Negative for dizziness, extremity weakness, gait problem, headaches, light-headedness and seizures.  Hematological: Negative for adenopathy. Does not bruise/bleed easily.  Psychiatric/Behavioral: Negative for confusion, depression and sleep disturbance. The patient is not nervous/anxious.     PHYSICAL EXAMINATION:  There were no vitals taken for this visit.  ECOG PERFORMANCE STATUS: {CHL ONC ECOG [id number]  Physical Exam  Constitutional: Oriented to person,  place, and time and well-developed, well-nourished, and in no distress. No distress.  HENT:  Head: Normocephalic and atraumatic.  Mouth/Throat: Oropharynx is clear and moist. No oropharyngeal exudate.  Eyes: Conjunctivae are normal. Right eye exhibits no discharge. Left eye exhibits no discharge. No scleral icterus.  Neck: Normal range of motion. Neck supple.  Cardiovascular: Normal rate, regular rhythm, normal heart sounds and intact distal pulses.   Pulmonary/Chest: Effort normal and breath sounds normal. No respiratory distress. No wheezes. No rales.  Abdominal: Soft. Bowel sounds are normal. Exhibits no distension and no mass. There is no tenderness.  Musculoskeletal: Normal range of motion. Exhibits no edema.  Lymphadenopathy:    No cervical adenopathy.   Neurological: Alert and oriented to person, place, and time. Exhibits normal muscle tone. Gait normal. Coordination normal.  Skin: Skin is warm and dry. No rash noted. Not diaphoretic. No erythema. No pallor.  Psychiatric: Mood, memory and judgment normal.  Vitals reviewed.  LABORATORY DATA: Lab Results  Component Value Date   WBC 9.2 06/13/2024   HGB 13.0 06/13/2024   HCT 35.7 (L) 06/13/2024   MCV 99.7 06/13/2024   PLT 312 06/13/2024      Chemistry      Component Value Date/Time   NA 129 (L) 06/13/2024 1346   K 4.1 06/13/2024 1346   CL 95 (L) 06/13/2024 1346   CO2 26 06/13/2024 1346   BUN 17 06/13/2024 1346   CREATININE 1.43 (H) 06/13/2024 1346      Component Value Date/Time   CALCIUM 9.8 06/13/2024 1346   ALKPHOS 84 06/13/2024 1346   AST 21 06/13/2024 1346   ALT 13 06/13/2024 1346   BILITOT 0.6 06/13/2024 1346       RADIOGRAPHIC STUDIES:  IR IMAGING GUIDED PORT INSERTION Result Date: 06/18/2024 INDICATION: poor iv access, needs port for chemo.  History of LEFT lung cancer. EXAM: IMPLANTED PORT A CATH PLACEMENT WITH ULTRASOUND AND FLUOROSCOPIC GUIDANCE MEDICATIONS: None ANESTHESIA/SEDATION: Moderate (conscious) sedation was employed during this procedure. A total of Versed  2 mg and Fentanyl  100 mcg was administered intravenously. Moderate Sedation Time: 20 minutes. The patient's level of consciousness and vital signs were monitored continuously by radiology nursing throughout the procedure under my direct supervision. FLUOROSCOPY: Radiation Exposure Index and estimated peak skin dose (PSD); Reference air kerma (RAK), 0 mGy. COMPLICATIONS: None immediate. PROCEDURE: The procedure, risks, benefits, and alternatives were explained to the patient. Questions regarding the procedure were encouraged and answered. The patient understands and consents to the procedure. The RIGHT neck and chest were prepped with chlorhexidine  in a sterile fashion, and a sterile drape was applied  covering the operative field. Maximum barrier sterile technique with sterile gowns and gloves were used for the procedure. A timeout was performed prior to the initiation of the procedure. Local anesthesia was provided with 1% lidocaine  with epinephrine. After creating a small venotomy incision, a micropuncture kit was utilized to access the internal jugular vein under direct, real-time ultrasound guidance. Ultrasound image documentation was performed. The microwire was kinked to measure appropriate catheter length. A subcutaneous port pocket was then created along the upper chest wall utilizing a combination of sharp and blunt dissection. The pocket was irrigated with sterile saline. A single lumen power injectable port was chosen for placement. The 8 Fr catheter was tunneled from the port pocket site to the venotomy incision. The port was placed in the pocket. The external catheter was trimmed to appropriate length. At the venotomy, an 8 Fr peel-away sheath was placed over  a guidewire under fluoroscopic guidance. The catheter was then placed through the sheath and the sheath was removed. Final catheter positioning was confirmed and documented with a fluoroscopic spot radiograph. The port was accessed with a Huber needle, aspirated and flushed with heparinized saline. The port pocket incision was closed with interrupted 3-0 Vicryl suture then Dermabond was applied, including at the venotomy incision. Dressings were placed. The patient tolerated the procedure well without immediate post procedural complication. IMPRESSION: Successful placement of a RIGHT internal jugular approach power injectable Port-A-Cath. The tip of the catheter is positioned within the proximal RIGHT atrium. The catheter is ready for immediate use. Thom Hall, MD [facility] Electronically Signed   By: Thom Hall M.D.   On: 06/18/2024 12:54   CT Chest W Contrast Result Date:  05/28/2024 CLINICAL DATA:  Non-small cell lung cancer (NSCLC), staging. * Tracking Code: BO * EXAM: CT CHEST WITH CONTRAST TECHNIQUE: Multidetector CT imaging of the chest was performed during intravenous contrast administration. RADIATION DOSE REDUCTION: This exam was performed according to the departmental dose-optimization program which includes automated exposure control, adjustment of the mA and/or kV according to patient size and/or use of iterative reconstruction technique. CONTRAST:  75mL OMNIPAQUE  IOHEXOL  300 MG/ML  SOLN COMPARISON:  CT scan chest from 01/23/2024. FINDINGS: Cardiovascular: Normal cardiac size. No pericardial effusion. No aortic aneurysm. There are coronary artery calcifications, in keeping with coronary artery disease. There are also mild-to-moderate peripheral atherosclerotic vascular calcifications of thoracic aorta and its major branches. Mediastinum/Nodes: Visualized thyroid  gland appears grossly unremarkable. No solid / cystic mediastinal masses. The esophagus is nondistended precluding optimal assessment. No axillary, mediastinal or hilar lymphadenopathy by size criteria. Lungs/Pleura: The central tracheo-bronchial tree is patent. Redemonstration of pre-existing heterogeneous mass in the left inferior hilum abutting the left inferomedial mediastinal pleura which contains small fiducial marker along the anterior aspect. The mass currently measures 1.5 x 4.4 cm, which previously measured up to 1.4 x 3.6 cm, when remeasured in similar fashion. There is associated small left pleural effusion, which appears slightly increased since the prior study. There are multiple irregular nodules predominantly in the left hemithorax which abut the visceral pleura and has increased in size and number since the prior study and highly concerning for worsening pleural metastases. No consolidation, lung collapse or pneumothorax. No right pleural effusion. Upper Abdomen: Visualized upper abdominal viscera  within normal limits. Musculoskeletal: The visualized soft tissues of the chest wall are grossly unremarkable. No suspicious osseous lesions. There are mild multilevel degenerative changes in the visualized spine. IMPRESSION: 1. There is slight interval increase in the size of left inferior hilar mass, as described above. There is also interval increase in the size and number of multiple pleural-based nodules in the left hemithorax, highly concerning for worsening pleural metastases. There is also interval increase in the size of small left pleural effusion. 2. Multiple other nonacute observations, as described above. Aortic Atherosclerosis (ICD10-I70.0). Electronically Signed   By: Ree Molt M.D.   On: 05/28/2024 10:30     ASSESSMENT/PLAN:   This is a very pleasant 73 year old Caucasian male diagnosed with a stage Ia (T1b, N0, M0) non-small cell lung cancer presented with left lower lobe lung nodule diagnosed in February 2023 status post SBRT to the left lower lobe lung nodule completed on November 25, 2021.   He had repeat CT scan of the chest performed  Unfortunately his scan showed progressive left lung nodularities suspicious for recurrent/metastatic disease. This was  followed by a PET scan and it showed a 2.0 x 0.9 cm nodule anteriorly in the lingula with maximum SUV of 2.5 and previously had SUV of 1.6 and suspicious for malignancy. He was treated with SBRT to the lingular nodule under the care of Dr. Dewey February 2025.   He had another CT scan. The scan showed interval increase in the size and number of multiple pleural-based nodules in the left hemothorax highly concerning for worsening pleural metastasis. There was also interval increase in the size of the small left pleural effusion.   Dr. Sherrod started him on systemic chemotherapy/immunotherapy with carboplatin for an AUC of 5, Alima 500 mg/m2, and keytruda 200 mg IV every 3 weeks. He is status post 1 cycle.   Labs were reviewed. He  tolerated ***   Labs were reviewed. Recommend he *** with cycle #2 in 2 weeks as scheduled.   ***Cough  The patient was advised to call immediately if he has any concerning symptoms in the interval. The patient voices understanding of current disease status and treatment options and is in agreement with the current care plan. All questions were answered. The patient knows to call the clinic with any problems, questions or concerns. We can certainly see the patient much sooner if necessary      No orders of the defined types were placed in this encounter.    I spent {CHL ONC TIME VISIT - DTPQU:[id number]} counseling the patient face to face. The total time spent in the appointment was {CHL ONC TIME VISIT - DTPQU:[id number]}.  Alphia Behanna L Eldena Dede, PA-C 06/18/24

## 2024-06-18 NOTE — Procedures (Signed)
 Vascular and Interventional Radiology Procedure Note  Patient: Chris Holloway DOB: [birth year] Medical Record Number: [id number] Note Date/Time: 06/18/24 10:22 AM   Performing Physician: Thom Hall, MD Assistant(s): None  Diagnosis: Lung cancer  Procedure: PORT PLACEMENT  Anesthesia: Conscious Sedation Complications: None Estimated Blood Loss: Minimal  Findings:  Successful right-sided port placement, with the tip of the catheter in the proximal right atrium.  Plan: Catheter ready for use.  See detailed procedure note with images in PACS. The patient tolerated the procedure well without incident or complication and was returned to Recovery in stable condition.    Thom Hall, MD [facility]   Pager. [phone] Clinic. [phone]

## 2024-06-18 NOTE — Discharge Instructions (Signed)
 No driving, do not make legal decisions, no operating any machinery, drink plenty of fluids to keep yourself hydrated, eat a light meal for your first meal after your procedure today, rest for the next 24 hours If you have concerns about your dressing or the procedure site please call the [facility] Radiology Dept at [phone] Keep the dressing on until tomorrow afternoon You may shower tomorrow after you remove your dressing Watch for signs of infection at the procedure site (redness, swelling, drainage, fever/ chills)

## 2024-06-18 NOTE — Sedation Documentation (Signed)
 RN Adyan Palau pulled 2mg  Versed  and 100mcg Fentanyl  in IR room pysix.Pt. Received 2mg  Versed  and 100 mcg Fentanyl  throughout the procedure.

## 2024-06-19 ENCOUNTER — Other Ambulatory Visit: Payer: Self-pay | Admitting: *Deleted

## 2024-06-19 DIAGNOSIS — C3432 Malignant neoplasm of lower lobe, left bronchus or lung: Secondary | ICD-10-CM

## 2024-06-20 ENCOUNTER — Inpatient Hospital Stay

## 2024-06-20 ENCOUNTER — Inpatient Hospital Stay (HOSPITAL_BASED_OUTPATIENT_CLINIC_OR_DEPARTMENT_OTHER): Admitting: Physician Assistant

## 2024-06-20 VITALS — BP 121/74 | HR 100 | Temp 97.7°F | Resp 16 | Ht 71.0 in | Wt 172.2 lb

## 2024-06-20 DIAGNOSIS — R059 Cough, unspecified: Secondary | ICD-10-CM

## 2024-06-20 DIAGNOSIS — Z5111 Encounter for antineoplastic chemotherapy: Secondary | ICD-10-CM | POA: Diagnosis not present

## 2024-06-20 DIAGNOSIS — C3432 Malignant neoplasm of lower lobe, left bronchus or lung: Secondary | ICD-10-CM

## 2024-06-20 LAB — CBC WITH DIFFERENTIAL (CANCER CENTER ONLY)
Abs Immature Granulocytes: 0.02 K/uL (ref 0.00–0.07)
Basophils Absolute: 0 K/uL (ref 0.0–0.1)
Basophils Relative: 1 %
Eosinophils Absolute: 0.2 K/uL (ref 0.0–0.5)
Eosinophils Relative: 9 %
HCT: 33.5 % — ABNORMAL LOW (ref 39.0–52.0)
Hemoglobin: 12.1 g/dL — ABNORMAL LOW (ref 13.0–17.0)
Immature Granulocytes: 1 %
Lymphocytes Relative: 50 %
Lymphs Abs: 1.3 K/uL (ref 0.7–4.0)
MCH: 36.7 pg — ABNORMAL HIGH (ref 26.0–34.0)
MCHC: 36.1 g/dL — ABNORMAL HIGH (ref 30.0–36.0)
MCV: 101.5 fL — ABNORMAL HIGH (ref 80.0–100.0)
Monocytes Absolute: 0.2 K/uL (ref 0.1–1.0)
Monocytes Relative: 7 %
Neutro Abs: 0.8 K/uL — ABNORMAL LOW (ref 1.7–7.7)
Neutrophils Relative %: 32 %
Platelet Count: 233 K/uL (ref 150–400)
RBC: 3.3 MIL/uL — ABNORMAL LOW (ref 4.22–5.81)
RDW: 12 % (ref 11.5–15.5)
WBC Count: 2.6 K/uL — ABNORMAL LOW (ref 4.0–10.5)
nRBC: 0 % (ref 0.0–0.2)

## 2024-06-20 LAB — CMP (CANCER CENTER ONLY)
ALT: 18 U/L (ref 0–44)
AST: 23 U/L (ref 15–41)
Albumin: 3.8 g/dL (ref 3.5–5.0)
Alkaline Phosphatase: 85 U/L (ref 38–126)
Anion gap: 8 (ref 5–15)
BUN: 26 mg/dL — ABNORMAL HIGH (ref 8–23)
CO2: 29 mmol/L (ref 22–32)
Calcium: 9.3 mg/dL (ref 8.9–10.3)
Chloride: 97 mmol/L — ABNORMAL LOW (ref 98–111)
Creatinine: 1.53 mg/dL — ABNORMAL HIGH (ref 0.61–1.24)
GFR, Estimated: 48 mL/min — ABNORMAL LOW (ref >60)
Glucose, Bld: 84 mg/dL (ref 70–99)
Potassium: 4.1 mmol/L (ref 3.5–5.1)
Sodium: 134 mmol/L — ABNORMAL LOW (ref 135–145)
Total Bilirubin: 0.4 mg/dL (ref 0.0–1.2)
Total Protein: 7.4 g/dL (ref 6.5–8.1)

## 2024-06-20 MED ORDER — BENZONATATE 100 MG PO CAPS: 100.0000 mg | ORAL_CAPSULE | Freq: Three times a day (TID) | ORAL | 2 refills | Status: AC | PRN

## 2024-06-21 LAB — T4: T4, Total: 9.3 ug/dL (ref 4.5–12.0)

## 2024-06-21 LAB — TSH: TSH: 1.59 u[IU]/mL (ref 0.350–4.500)

## 2024-06-25 ENCOUNTER — Inpatient Hospital Stay

## 2024-06-25 DIAGNOSIS — C3432 Malignant neoplasm of lower lobe, left bronchus or lung: Secondary | ICD-10-CM

## 2024-06-25 DIAGNOSIS — Z5111 Encounter for antineoplastic chemotherapy: Secondary | ICD-10-CM | POA: Diagnosis not present

## 2024-06-25 LAB — CMP (CANCER CENTER ONLY)
ALT: 36 U/L (ref 0–44)
AST: 35 U/L (ref 15–41)
Albumin: 3.6 g/dL (ref 3.5–5.0)
Alkaline Phosphatase: 81 U/L (ref 38–126)
Anion gap: 5 (ref 5–15)
BUN: 14 mg/dL (ref 8–23)
CO2: 29 mmol/L (ref 22–32)
Calcium: 9.1 mg/dL (ref 8.9–10.3)
Chloride: 99 mmol/L (ref 98–111)
Creatinine: 1.54 mg/dL — ABNORMAL HIGH (ref 0.61–1.24)
GFR, Estimated: 48 mL/min — ABNORMAL LOW (ref >60)
Glucose, Bld: 108 mg/dL — ABNORMAL HIGH (ref 70–99)
Potassium: 3.9 mmol/L (ref 3.5–5.1)
Sodium: 133 mmol/L — ABNORMAL LOW (ref 135–145)
Total Bilirubin: 0.4 mg/dL (ref 0.0–1.2)
Total Protein: 7.1 g/dL (ref 6.5–8.1)

## 2024-06-25 LAB — CBC WITH DIFFERENTIAL (CANCER CENTER ONLY)
Abs Immature Granulocytes: 0 K/uL (ref 0.00–0.07)
Basophils Absolute: 0 K/uL (ref 0.0–0.1)
Basophils Relative: 0 %
Eosinophils Absolute: 0.1 K/uL (ref 0.0–0.5)
Eosinophils Relative: 5 %
HCT: 30.6 % — ABNORMAL LOW (ref 39.0–52.0)
Hemoglobin: 11.1 g/dL — ABNORMAL LOW (ref 13.0–17.0)
Immature Granulocytes: 0 %
Lymphocytes Relative: 61 %
Lymphs Abs: 1.5 K/uL (ref 0.7–4.0)
MCH: 36.4 pg — ABNORMAL HIGH (ref 26.0–34.0)
MCHC: 36.3 g/dL — ABNORMAL HIGH (ref 30.0–36.0)
MCV: 100.3 fL — ABNORMAL HIGH (ref 80.0–100.0)
Monocytes Absolute: 0.4 K/uL (ref 0.1–1.0)
Monocytes Relative: 15 %
Neutro Abs: 0.5 K/uL — ABNORMAL LOW (ref 1.7–7.7)
Neutrophils Relative %: 19 %
Platelet Count: 125 K/uL — ABNORMAL LOW (ref 150–400)
RBC: 3.05 MIL/uL — ABNORMAL LOW (ref 4.22–5.81)
RDW: 11.8 % (ref 11.5–15.5)
Smear Review: NORMAL
WBC Count: 2.5 K/uL — ABNORMAL LOW (ref 4.0–10.5)
nRBC: 0 % (ref 0.0–0.2)

## 2024-06-26 ENCOUNTER — Other Ambulatory Visit: Payer: Self-pay | Admitting: Physician Assistant

## 2024-06-26 ENCOUNTER — Inpatient Hospital Stay

## 2024-06-26 ENCOUNTER — Telehealth: Payer: Self-pay

## 2024-06-26 VITALS — BP 142/82 | HR 90 | Temp 97.9°F | Resp 16

## 2024-06-26 DIAGNOSIS — Z5111 Encounter for antineoplastic chemotherapy: Secondary | ICD-10-CM | POA: Diagnosis not present

## 2024-06-26 DIAGNOSIS — D709 Neutropenia, unspecified: Secondary | ICD-10-CM

## 2024-06-26 LAB — T4: T4, Total: 9.6 ug/dL (ref 4.5–12.0)

## 2024-06-26 LAB — TSH: TSH: 1.52 u[IU]/mL (ref 0.350–4.500)

## 2024-06-26 MED ORDER — TBO-FILGRASTIM 300 MCG/0.5ML ~~LOC~~ SOSY: 300.0000 ug | PREFILLED_SYRINGE | Freq: Once | SUBCUTANEOUS | Status: DC

## 2024-06-26 MED ORDER — FILGRASTIM-SNDZ 300 MCG/0.5ML IJ SOSY
300.0000 ug | PREFILLED_SYRINGE | Freq: Once | INTRAMUSCULAR | Status: AC
  Administered 2024-06-26: 300 ug via SUBCUTANEOUS
  Filled 2024-06-26: qty 0.5

## 2024-06-26 NOTE — Telephone Encounter (Signed)
 Spoke with patient regarding recent lab results. Per [name], PA, WBC is low, and patient will benefit from 3 days of Granix injections. Reviewed neutropenic precautions with patient and educated him on the use of Claritin to help manage potential bone pain associated with Granix. Scheduled patient for injection today @ 330, tomorrow @ 245 and Friday at 930 AM. He voiced understanding.

## 2024-06-27 ENCOUNTER — Inpatient Hospital Stay

## 2024-06-27 VITALS — BP 131/76 | HR 91 | Temp 98.2°F | Resp 16

## 2024-06-27 DIAGNOSIS — Z5111 Encounter for antineoplastic chemotherapy: Secondary | ICD-10-CM | POA: Diagnosis not present

## 2024-06-27 DIAGNOSIS — D709 Neutropenia, unspecified: Secondary | ICD-10-CM

## 2024-06-27 MED ORDER — FILGRASTIM-SNDZ 300 MCG/0.5ML IJ SOSY
300.0000 ug | PREFILLED_SYRINGE | Freq: Once | INTRAMUSCULAR | Status: AC
  Administered 2024-06-27: 300 ug via SUBCUTANEOUS
  Filled 2024-06-27: qty 0.5

## 2024-06-28 ENCOUNTER — Inpatient Hospital Stay

## 2024-06-28 VITALS — BP 116/69 | HR 85 | Temp 97.7°F | Resp 17

## 2024-06-28 DIAGNOSIS — Z5111 Encounter for antineoplastic chemotherapy: Secondary | ICD-10-CM | POA: Diagnosis not present

## 2024-06-28 DIAGNOSIS — D709 Neutropenia, unspecified: Secondary | ICD-10-CM

## 2024-06-28 MED ORDER — FILGRASTIM-SNDZ 300 MCG/0.5ML IJ SOSY
300.0000 ug | PREFILLED_SYRINGE | Freq: Once | INTRAMUSCULAR | Status: AC
  Administered 2024-06-28: 300 ug via SUBCUTANEOUS
  Filled 2024-06-28: qty 0.5

## 2024-06-28 NOTE — Progress Notes (Signed)
 [facility] OFFICE PROGRESS NOTE  [name], MD [address] [place] [address]  DIAGNOSIS:  1) suspicious multifocal adenocarcinoma presented with hypermetabolic left lower lobe lung nodule suspicious for disease recurrence diagnosed in January 2025. 2)Stage IA (T1b, N0, M0) non-small cell lung cancer, adenocarcinoma.  The patient presented with a left lower lobe lung nodule.  She was diagnosed in February 2023.    Biomarker Findings Microsatellite status - Cannot Be Determined ? Tumor Mutational Burden - Cannot Be Determined Genomic Findings For a complete list of the genes assayed, please refer to the Appendix. KEAP1 W252C KRAS G12V STK11 E33* MUTYH G382D 7 Disease relevant genes with no reportable alterations: ALK, BRAF, EGFR, ERBB2, MET, RET, ROS1   PDL1 Expression: 0%  PRIOR THERAPY: 1) SBRT to the left lower lobe lung nodule under the care of Dr. Dewey completed on November 25, 2021 2) SBRT to the lingular nodule under the care of Dr. Dewey in February 2025  CURRENT THERAPY: Palliative systemic chemotherapy with carboplatin for an AUC of 4, alimta 375 mg/m2, and keytruda 200 mg IV every 3 weeks. First dose on 06/13/24. He is status post one cycle. His doses are reduced because he has one kidney.   INTERVAL HISTORY: Nevan Creighton 73 y.o. male returns to the clinic for a follow up visit accompanied by his girlfriend . The patient recently found to have disease progression. He was started on chemo/immunotherapy. He tolerated this well without any significant adverse side effects except he had some neutropenia which required GCSF injections. He denies any signs or symptoms of infection. He experiences leg weakness due to the injections. He still tries to be active and works and walks.   He has watery and itchy eyes, which have improved with Refresh eye drops and an allergy pill.  With only one kidney, his chemotherapy dose is adjusted.  His creatinine level is 1.54, and his GFR is 48, stable over the past two weeks.  No fevers, chills, skin infections, burning with urination, diarrhea, or upper respiratory infections. He has a cough worse in the mornings, improving throughout the day. Tessalon is effective.   He maintains a stable appetite and weight, with a recent loss of only one pound. He experiences some shortness of breath but no significant changes in breathing since the last visit.  He uses Miralax for constipation, taking it as needed to maintain regular bowel movements.  He experiences fatigue, more noticeable as the day progresses. He remains active, walking two miles daily.   He is concerned about the potential risks of close contact with others due to chemotherapy, including the use of separate toilets and protection during physical intimacy with his partner.    He is here today for evaluation and repeat blood work before undergoing cycle #2.     MEDICAL HISTORY: Past Medical History:  Diagnosis Date   Arthritis    hands back,    Chronic kidney disease    Per Dr. Arloa but not with Dr. Alvaro   Hypertension    lung ca 2023   Renal cancer (HCC) 2021   Stroke (HCC) 1998   mild stroke like symptoms lasted 5 min.    ALLERGIES:  has no known allergies.  MEDICATIONS:  Current Outpatient Medications  Medication Sig Dispense Refill   ALPRAZolam  (XANAX ) 1 MG tablet Take 1.5 mg by mouth at bedtime.     Ascorbic Acid (VITAMIN C) 1000 MG tablet Take 1,000 mg  by mouth daily. (Patient not taking: Reported on 06/03/2024)     aspirin EC 81 MG tablet Take 81 mg by mouth at bedtime. Swallow whole.     benzonatate (TESSALON) 100 MG capsule Take 1 capsule (100 mg total) by mouth 3 (three) times daily as needed. 30 capsule 2   buPROPion (WELLBUTRIN) 75 MG tablet Take 10 mg by mouth 2 (two) times daily.     cetirizine (ZYRTEC) 10 MG tablet Take 10 mg by mouth daily.     chlorthalidone (HYGROTON) 25 MG tablet Take 25 mg  by mouth at bedtime.     folic acid (FOLVITE) 1 MG tablet Take 1 tablet (1 mg total) by mouth daily. Start 7 days before pemetrexed chemotherapy. Continue until 21 days after pemetrexed completed. 100 tablet 3   guaifenesin (HUMIBID E) 400 MG TABS tablet Take 400 mg by mouth in the morning and at bedtime.     lidocaine -prilocaine (EMLA) cream Apply 1 Application topically as needed. Apply 1-2 hours before lab appointment. 30 g 0   methylPREDNISolone  (MEDROL  DOSEPAK) 4 MG TBPK tablet Use as instructed 21 tablet 0   ondansetron  (ZOFRAN ) 8 MG tablet Take 1 tablet (8 mg total) by mouth every 8 (eight) hours as needed for nausea or vomiting. Start on the third day after carboplatin. 30 tablet 1   Polyvinyl Alcohol-Povidone (CLEAR EYES ALL SEASONS) 5-6 MG/ML SOLN Place 1 drop into both eyes daily as needed (Dry eyes).     pravastatin (PRAVACHOL) 40 MG tablet Take 40 mg by mouth at bedtime.     prochlorperazine (COMPAZINE) 10 MG tablet Take 1 tablet (10 mg total) by mouth every 6 (six) hours as needed for nausea or vomiting. 30 tablet 1   promethazine -dextromethorphan (PROMETHAZINE -DM) 6.25-15 MG/5ML syrup 5 mL as needed Orally every 6 hrs for 7 days     sildenafil (VIAGRA) 100 MG tablet Take 100 mg by mouth daily as needed for erectile dysfunction.     valACYclovir (VALTREX) 500 MG tablet Take 500 mg by mouth daily.     No current facility-administered medications for this visit.   Facility-Administered Medications Ordered in Other Visits  Medication Dose Route Frequency Provider Last Rate Last Admin   0.9 %  sodium chloride  infusion   Intravenous Continuous Sherrod Sherrod, MD 10 mL/hr at 07/04/24 1446 New Bag at 07/04/24 1446   CARBOplatin (PARAPLATIN) 300 mg in sodium chloride  0.9 % 100 mL chemo infusion  300 mg Intravenous Once Sherrod Sherrod, MD       PEMEtrexed Disodium (ALIMTA) 800 mg in sodium chloride  0.9 % 100 mL chemo infusion  375 mg/m2 (Treatment Plan Recorded) Intravenous Once Sherrod Sherrod, MD        SURGICAL HISTORY:  Past Surgical History:  Procedure Laterality Date   BRONCHIAL BIOPSY  10/19/2021   Procedure: BRONCHIAL BIOPSIES;  Surgeon: Brenna Adine CROME, DO;  Location: [facility];  Service: Pulmonary;;   BRONCHIAL NEEDLE ASPIRATION BIOPSY  10/19/2021   Procedure: BRONCHIAL NEEDLE ASPIRATION BIOPSIES;  Surgeon: Brenna Adine CROME, DO;  Location: [facility];  Service: Pulmonary;;   FIDUCIAL MARKER PLACEMENT  10/19/2021   Procedure: FIDUCIAL MARKER PLACEMENT;  Surgeon: Brenna Adine CROME, DO;  Location: [facility];  Service: Pulmonary;;   HAND LIGAMENT RECONSTRUCTION Right 1990s   Saw injury   IR IMAGING GUIDED PORT INSERTION  06/18/2024   ROBOTIC ASSITED PARTIAL NEPHRECTOMY Left 07/15/2020   Procedure: XI ROBOTIC ASSITED RADICAL NEPHRECTOMY WITH INTRAOPERAATIVE ULTRASOUND;  Surgeon: Alvaro Hummer, MD;  Location: [facility];  Service: Urology;  Laterality: Left;  3 HRS   SHOULDER ARTHROSCOPY W/ ROTATOR CUFF REPAIR Left 08/2018   VIDEO BRONCHOSCOPY WITH RADIAL ENDOBRONCHIAL ULTRASOUND  10/19/2021   Procedure: RADIAL ENDOBRONCHIAL ULTRASOUND;  Surgeon: Brenna Adine CROME, DO;  Location: [facility];  Service: Pulmonary;;    REVIEW OF SYSTEMS:   Review of Systems  Constitutional: Positive for fatigue towards the end of the day. Negative for appetite change, chills, fever and unexpected weight change.  HENT: Negative for mouth sores, nosebleeds, sore throat and trouble swallowing.   Eyes: Improved watery and itchy eyes. Negative for eye problems and icterus.  Respiratory: Stable mild dyspnea on exertion and cough. Negative for hemoptysis and wheezing.     Cardiovascular: Negative for chest pain and leg swelling.  Gastrointestinal: Negative for abdominal pain, constipation (none at this time), diarrhea, nausea and vomiting.  Genitourinary: Negative for bladder incontinence, difficulty urinating, dysuria, frequency and hematuria.   Musculoskeletal: Negative for back pain,  gait problem, neck pain and neck stiffness.  Skin: Negative for itching and rash.  Neurological: Negative for dizziness, extremity weakness, gait problem, headaches, light-headedness and seizures.  Hematological: Negative for adenopathy. Does not bruise/bleed easily.  Psychiatric/Behavioral: Negative for confusion, depression and sleep disturbance. The patient is not nervous/anxious.     PHYSICAL EXAMINATION:  Blood pressure 125/72, pulse (!) 102, temperature 98.3 F (36.8 C), temperature source Temporal, resp. rate 18, height 5' 11 (1.803 m), weight 171 lb 4.8 oz (77.7 kg), SpO2 99%.  ECOG PERFORMANCE STATUS: 1  Physical Exam  Constitutional: Oriented to person, place, and time and well-developed, well-nourished, and in no distress.  HENT:  Head: Normocephalic and atraumatic.  Mouth/Throat: Oropharynx is clear and moist. No oropharyngeal exudate.  Eyes: Conjunctivae are normal. Right eye exhibits no discharge. Left eye exhibits no discharge. No scleral icterus.  Neck: Normal range of motion. Neck supple.  Cardiovascular: Normal rate, regular rhythm, normal heart sounds and intact distal pulses.   Pulmonary/Chest: Effort normal and breath sounds normal. No respiratory distress. No wheezes. No rales.  Abdominal: Soft. Bowel sounds are normal. Exhibits no distension and no mass. There is no tenderness.  Musculoskeletal: Normal range of motion. Exhibits no edema.  Lymphadenopathy:    No cervical adenopathy.  Neurological: Alert and oriented to person, place, and time. Exhibits normal muscle tone. Gait normal. Coordination normal.  Skin: Skin is warm and dry. No rash noted. Not diaphoretic. No erythema. No pallor.  Psychiatric: Mood, memory and judgment normal.  Vitals reviewed.  LABORATORY DATA: Lab Results  Component Value Date   WBC 8.1 07/04/2024   HGB 11.8 (L) 07/04/2024   HCT 32.8 (L) 07/04/2024   MCV 101.5 (H) 07/04/2024   PLT 523 (H) 07/04/2024      Chemistry       Component Value Date/Time   NA 133 (L) 07/04/2024 1323   K 4.0 07/04/2024 1323   CL 98 07/04/2024 1323   CO2 28 07/04/2024 1323   BUN 14 07/04/2024 1323   CREATININE 1.54 (H) 07/04/2024 1323      Component Value Date/Time   CALCIUM 9.5 07/04/2024 1323   ALKPHOS 94 07/04/2024 1323   AST 26 07/04/2024 1323   ALT 21 07/04/2024 1323   BILITOT 0.3 07/04/2024 1323       RADIOGRAPHIC STUDIES:  IR IMAGING GUIDED PORT INSERTION Result Date: 06/18/2024 INDICATION: poor iv access, needs port for chemo.  History of LEFT lung cancer. EXAM: IMPLANTED PORT A CATH PLACEMENT WITH ULTRASOUND AND FLUOROSCOPIC GUIDANCE  MEDICATIONS: None ANESTHESIA/SEDATION: Moderate (conscious) sedation was employed during this procedure. A total of Versed  2 mg and Fentanyl  100 mcg was administered intravenously. Moderate Sedation Time: 20 minutes. The patient's level of consciousness and vital signs were monitored continuously by radiology nursing throughout the procedure under my direct supervision. FLUOROSCOPY: Radiation Exposure Index and estimated peak skin dose (PSD); Reference air kerma (RAK), 0 mGy. COMPLICATIONS: None immediate. PROCEDURE: The procedure, risks, benefits, and alternatives were explained to the patient. Questions regarding the procedure were encouraged and answered. The patient understands and consents to the procedure. The RIGHT neck and chest were prepped with chlorhexidine  in a sterile fashion, and a sterile drape was applied covering the operative field. Maximum barrier sterile technique with sterile gowns and gloves were used for the procedure. A timeout was performed prior to the initiation of the procedure. Local anesthesia was provided with 1% lidocaine  with epinephrine. After creating a small venotomy incision, a micropuncture kit was utilized to access the internal jugular vein under direct, real-time ultrasound guidance. Ultrasound image documentation was performed. The microwire was kinked to  measure appropriate catheter length. A subcutaneous port pocket was then created along the upper chest wall utilizing a combination of sharp and blunt dissection. The pocket was irrigated with sterile saline. A single lumen power injectable port was chosen for placement. The 8 Fr catheter was tunneled from the port pocket site to the venotomy incision. The port was placed in the pocket. The external catheter was trimmed to appropriate length. At the venotomy, an 8 Fr peel-away sheath was placed over a guidewire under fluoroscopic guidance. The catheter was then placed through the sheath and the sheath was removed. Final catheter positioning was confirmed and documented with a fluoroscopic spot radiograph. The port was accessed with a Huber needle, aspirated and flushed with heparinized saline. The port pocket incision was closed with interrupted 3-0 Vicryl suture then Dermabond was applied, including at the venotomy incision. Dressings were placed. The patient tolerated the procedure well without immediate post procedural complication. IMPRESSION: Successful placement of a RIGHT internal jugular approach power injectable Port-A-Cath. The tip of the catheter is positioned within the proximal RIGHT atrium. The catheter is ready for immediate use. Thom Hall, MD [facility] Electronically Signed   By: Thom Hall M.D.   On: 06/18/2024 12:54     ASSESSMENT/PLAN:  This is a very pleasant 73 year old Caucasian male diagnosed with a stage Ia (T1b, N0, M0) non-small cell lung cancer presented with left lower lobe lung nodule diagnosed in February 2023 status post SBRT to the left lower lobe lung nodule completed on November 25, 2021.    He had repeat CT scan of the chest performed  Unfortunately his scan showed progressive left lung nodularities suspicious for recurrent/metastatic disease. This was followed by a PET scan and it showed a 2.0 x 0.9 cm nodule  anteriorly in the lingula with maximum SUV of 2.5 and previously had SUV of 1.6 and suspicious for malignancy. He was treated with SBRT to the lingular nodule under the care of Dr. Dewey February 2025.   He had another CT scan. The scan showed interval increase in the size and number of multiple pleural-based nodules in the left hemothorax highly concerning for worsening pleural metastasis. There was also interval increase in the size of the small left pleural effusion.    Dr. Sherrod started him on systemic chemotherapy/immunotherapy with carboplatin for an AUC of 4, Alima 375 mg/m2, and  Keytruda 200 mg IV every 3 weeks. He is status post 1 cycle.    Labs were reviewed. His creatinine is 1.54. Reviewed with Dr. Sherrod. His GFR is 48. He has one kidney. His doses of chemo are reduced to carbo AUC of 4 and Alimta 375 mg/m2.  Recommend he proceed with cycle #2 today as scheduled. His significant other was inquireing about IVF with treatment to prevent kidney damage. We will arrange for 1/2 L over 1 hour with treatment but he was advised to hydrate well at home.   We reviewed the chemotherapy/immunotherapy plan for the future as the patient and his significant other had questions about the goals of treatment, duration, and imaging.    We will monitor his labs on a weekly basis and arrange for G-CSF injections on his as needed basis.  We did recommend arranging for G-CSF injections if his ANC is around 0.6 or less.    Constipation Mild constipation managed with Miralax.   Tobacco use Continued tobacco use affecting cough and phlegm. Advised on smoking reduction benefits. - Advise on the benefits of reducing smoking.  Chemotherapy-induced decreased appetite and weight loss Appetite low, minimal weight loss. Encouraged caloric intake through small, frequent meals. - Encouraged small, frequent meals to maintain caloric intake. - Instructed to monitor weight and flag nutritionist if weight loss  exceeds two pounds in a week.  He will continue tessalon for cough if needed.   Chemotherapy-induced ocular irritation and epiphora Ocular irritation and epiphora due to chemotherapy. Improved with artificial tears and antihistamines. - Continue using artificial tears and antihistamines as needed.  Discussed with pharmacy regarding the questions related to exposure to close contacts. For intercourse, protection would be required. They will use separate toilets for 72 hours.   The patient was advised to call immediately if he has any concerning symptoms in the interval. The patient voices understanding of current disease status and treatment options and is in agreement with the current care plan. All questions were answered. The patient knows to call the clinic with any problems, questions or concerns. We can certainly see the patient much sooner if necessary    No orders of the defined types were placed in this encounter.     The total time spent in the appointment was 30-39 minutes  Mehak Roskelley L Kinslee Dalpe, PA-C 07/04/24

## 2024-07-01 ENCOUNTER — Other Ambulatory Visit: Payer: Self-pay

## 2024-07-03 ENCOUNTER — Telehealth: Payer: Self-pay

## 2024-07-03 MED FILL — Fosaprepitant Dimeglumine For IV Infusion 150 MG (Base Eq): INTRAVENOUS | Qty: 5 | Status: AC

## 2024-07-03 NOTE — Telephone Encounter (Signed)
 Received call from patient regarding watery eyes. Pt states that his right eye itches some. Informed pt that Alimta can cause watery eyes. Per [name] H., PA pt should take antihistamine and use refresh eye drops. Notified pt about the refresh eye drops. Pt confirms he takes cetirizine daily. Pt knows to call if he has any additional concerns.

## 2024-07-04 ENCOUNTER — Inpatient Hospital Stay

## 2024-07-04 ENCOUNTER — Inpatient Hospital Stay: Admitting: Physician Assistant

## 2024-07-04 ENCOUNTER — Inpatient Hospital Stay: Attending: Internal Medicine | Admitting: Physician Assistant

## 2024-07-04 VITALS — BP 125/72 | HR 102 | Temp 98.3°F | Resp 18 | Ht 71.0 in | Wt 171.3 lb

## 2024-07-04 DIAGNOSIS — T451X5A Adverse effect of antineoplastic and immunosuppressive drugs, initial encounter: Secondary | ICD-10-CM | POA: Diagnosis not present

## 2024-07-04 DIAGNOSIS — J942 Hemothorax: Secondary | ICD-10-CM | POA: Diagnosis not present

## 2024-07-04 DIAGNOSIS — H04209 Unspecified epiphora, unspecified lacrimal gland: Secondary | ICD-10-CM | POA: Insufficient documentation

## 2024-07-04 DIAGNOSIS — Z905 Acquired absence of kidney: Secondary | ICD-10-CM | POA: Diagnosis not present

## 2024-07-04 DIAGNOSIS — J90 Pleural effusion, not elsewhere classified: Secondary | ICD-10-CM | POA: Diagnosis not present

## 2024-07-04 DIAGNOSIS — C3432 Malignant neoplasm of lower lobe, left bronchus or lung: Secondary | ICD-10-CM | POA: Insufficient documentation

## 2024-07-04 DIAGNOSIS — Z85528 Personal history of other malignant neoplasm of kidney: Secondary | ICD-10-CM | POA: Insufficient documentation

## 2024-07-04 DIAGNOSIS — Z923 Personal history of irradiation: Secondary | ICD-10-CM | POA: Insufficient documentation

## 2024-07-04 DIAGNOSIS — Z5111 Encounter for antineoplastic chemotherapy: Secondary | ICD-10-CM | POA: Insufficient documentation

## 2024-07-04 DIAGNOSIS — K5900 Constipation, unspecified: Secondary | ICD-10-CM | POA: Insufficient documentation

## 2024-07-04 DIAGNOSIS — Z79899 Other long term (current) drug therapy: Secondary | ICD-10-CM | POA: Insufficient documentation

## 2024-07-04 DIAGNOSIS — Z5112 Encounter for antineoplastic immunotherapy: Secondary | ICD-10-CM | POA: Insufficient documentation

## 2024-07-04 LAB — CMP (CANCER CENTER ONLY)
ALT: 21 U/L (ref 0–44)
AST: 26 U/L (ref 15–41)
Albumin: 3.8 g/dL (ref 3.5–5.0)
Alkaline Phosphatase: 94 U/L (ref 38–126)
Anion gap: 7 (ref 5–15)
BUN: 14 mg/dL (ref 8–23)
CO2: 28 mmol/L (ref 22–32)
Calcium: 9.5 mg/dL (ref 8.9–10.3)
Chloride: 98 mmol/L (ref 98–111)
Creatinine: 1.54 mg/dL — ABNORMAL HIGH (ref 0.61–1.24)
GFR, Estimated: 48 mL/min — ABNORMAL LOW (ref >60)
Glucose, Bld: 138 mg/dL — ABNORMAL HIGH (ref 70–99)
Potassium: 4 mmol/L (ref 3.5–5.1)
Sodium: 133 mmol/L — ABNORMAL LOW (ref 135–145)
Total Bilirubin: 0.3 mg/dL (ref 0.0–1.2)
Total Protein: 7.5 g/dL (ref 6.5–8.1)

## 2024-07-04 LAB — CBC WITH DIFFERENTIAL (CANCER CENTER ONLY)
Abs Immature Granulocytes: 0.22 K/uL — ABNORMAL HIGH (ref 0.00–0.07)
Basophils Absolute: 0.1 K/uL (ref 0.0–0.1)
Basophils Relative: 1 %
Eosinophils Absolute: 0.1 K/uL (ref 0.0–0.5)
Eosinophils Relative: 2 %
HCT: 32.8 % — ABNORMAL LOW (ref 39.0–52.0)
Hemoglobin: 11.8 g/dL — ABNORMAL LOW (ref 13.0–17.0)
Immature Granulocytes: 3 %
Lymphocytes Relative: 27 %
Lymphs Abs: 2.2 K/uL (ref 0.7–4.0)
MCH: 36.5 pg — ABNORMAL HIGH (ref 26.0–34.0)
MCHC: 36 g/dL (ref 30.0–36.0)
MCV: 101.5 fL — ABNORMAL HIGH (ref 80.0–100.0)
Monocytes Absolute: 0.9 K/uL (ref 0.1–1.0)
Monocytes Relative: 11 %
Neutro Abs: 4.6 K/uL (ref 1.7–7.7)
Neutrophils Relative %: 56 %
Platelet Count: 523 K/uL — ABNORMAL HIGH (ref 150–400)
RBC: 3.23 MIL/uL — ABNORMAL LOW (ref 4.22–5.81)
RDW: 12.9 % (ref 11.5–15.5)
WBC Count: 8.1 K/uL (ref 4.0–10.5)
nRBC: 0 % (ref 0.0–0.2)

## 2024-07-04 LAB — TSH: TSH: 1.8 u[IU]/mL (ref 0.350–4.500)

## 2024-07-04 MED ORDER — SODIUM CHLORIDE 0.9 % IV SOLN
150.0000 mg | Freq: Once | INTRAVENOUS | Status: AC
  Administered 2024-07-04: 150 mg via INTRAVENOUS
  Filled 2024-07-04: qty 150

## 2024-07-04 MED ORDER — SODIUM CHLORIDE 0.9 % IV SOLN
375.0000 mg/m2 | Freq: Once | INTRAVENOUS | Status: AC
  Administered 2024-07-04: 800 mg via INTRAVENOUS
  Filled 2024-07-04: qty 20

## 2024-07-04 MED ORDER — SODIUM CHLORIDE 0.9 % IV SOLN: INTRAVENOUS | Status: DC

## 2024-07-04 MED ORDER — SODIUM CHLORIDE 0.9 % IV SOLN
200.0000 mg | Freq: Once | INTRAVENOUS | Status: AC
  Administered 2024-07-04: 200 mg via INTRAVENOUS
  Filled 2024-07-04: qty 200

## 2024-07-04 MED ORDER — PALONOSETRON HCL INJECTION 0.25 MG/5ML
0.2500 mg | Freq: Once | INTRAVENOUS | Status: AC
  Administered 2024-07-04: 0.25 mg via INTRAVENOUS
  Filled 2024-07-04: qty 5

## 2024-07-04 MED ORDER — DEXAMETHASONE SOD PHOSPHATE PF 10 MG/ML IJ SOLN
10.0000 mg | Freq: Once | INTRAMUSCULAR | Status: AC
  Administered 2024-07-04: 10 mg via INTRAVENOUS

## 2024-07-04 MED ORDER — SODIUM CHLORIDE 0.9 % IV SOLN
300.8000 mg | Freq: Once | INTRAVENOUS | Status: AC
  Administered 2024-07-04: 300 mg via INTRAVENOUS
  Filled 2024-07-04: qty 30

## 2024-07-04 MED ORDER — SODIUM CHLORIDE 0.9 % IV SOLN: Freq: Once | INTRAVENOUS | Status: AC

## 2024-07-04 NOTE — Patient Instructions (Addendum)
 [facility] - A DEPT OF [facility]  Discharge Instructions: Thank you for choosing [facility] to provide your oncology and hematology care.   If you have a lab appointment with the Cancer Center, please go directly to the Cancer Center and check in at the registration area.   Wear comfortable clothing and clothing appropriate for easy access to any Portacath or PICC line.   We strive to give you quality time with your provider. You may need to reschedule your appointment if you arrive late (15 or more minutes).  Arriving late affects you and other patients whose appointments are after yours.  Also, if you miss three or more appointments without notifying the office, you may be dismissed from the clinic at the provider's discretion.      For prescription refill requests, have your pharmacy contact our office and allow 72 hours for refills to be completed.    Today you received the following chemotherapy and/or immunotherapy agents : Keytruda (pembrolizumab), Alimta (Pemetrexed), Carboplatin (paraplatin),     To help prevent nausea and vomiting after your treatment, we encourage you to take your nausea medication as directed.  BELOW ARE SYMPTOMS THAT SHOULD BE REPORTED IMMEDIATELY: *FEVER GREATER THAN 100.4 F (38 C) OR HIGHER *CHILLS OR SWEATING *NAUSEA AND VOMITING THAT IS NOT CONTROLLED WITH YOUR NAUSEA MEDICATION *UNUSUAL SHORTNESS OF BREATH *UNUSUAL BRUISING OR BLEEDING *URINARY PROBLEMS (pain or burning when urinating, or frequent urination) *BOWEL PROBLEMS (unusual diarrhea, constipation, pain near the anus) TENDERNESS IN MOUTH AND THROAT WITH OR WITHOUT PRESENCE OF ULCERS (sore throat, sores in mouth, or a toothache) UNUSUAL RASH, SWELLING OR PAIN  UNUSUAL VAGINAL DISCHARGE OR ITCHING   Items with * indicate a potential emergency and should be followed up as soon as possible or go to the Emergency Department if any problems should  occur.  Please show the CHEMOTHERAPY ALERT CARD or IMMUNOTHERAPY ALERT CARD at check-in to the Emergency Department and triage nurse.  Should you have questions after your visit or need to cancel or reschedule your appointment, please contact [facility] - A DEPT OF [facility]  Dept: [phone]  and follow the prompts.  Office hours are 8:00 a.m. to 4:30 p.m. Monday - Friday. Please note that voicemails left after 4:00 p.m. may not be returned until the following business day.  We are closed weekends and major holidays. You have access to a nurse at all times for urgent questions. Please call the main number to the clinic Dept: [phone] and follow the prompts.   For any non-urgent questions, you may also contact your provider using MyChart. We now offer e-Visits for anyone 38 and older to request care online for non-urgent symptoms. For details visit [email or web address].   Also download the MyChart app! Go to the app store, search MyChart, open the app, select [facility], and log in with your MyChart username and password.

## 2024-07-05 ENCOUNTER — Other Ambulatory Visit: Payer: Self-pay

## 2024-07-05 LAB — T4: T4, Total: 9.4 ug/dL (ref 4.5–12.0)

## 2024-07-08 ENCOUNTER — Telehealth: Payer: Self-pay | Admitting: *Deleted

## 2024-07-08 NOTE — Telephone Encounter (Signed)
 Spoke with pt. called stating he is feeling weak but a little better today. He wanted to know if he should take any B12. I informed pt. that he was given a B12 injection with his last treatment and will get again every third cycle. Encouraged pt. to rest frequently and and contact us 

## 2024-07-10 ENCOUNTER — Encounter: Payer: Self-pay | Admitting: Internal Medicine

## 2024-07-10 ENCOUNTER — Other Ambulatory Visit: Payer: Self-pay

## 2024-07-12 ENCOUNTER — Inpatient Hospital Stay

## 2024-07-12 ENCOUNTER — Other Ambulatory Visit: Payer: Self-pay

## 2024-07-12 DIAGNOSIS — Z5111 Encounter for antineoplastic chemotherapy: Secondary | ICD-10-CM | POA: Diagnosis not present

## 2024-07-12 DIAGNOSIS — C3432 Malignant neoplasm of lower lobe, left bronchus or lung: Secondary | ICD-10-CM

## 2024-07-12 LAB — CMP (CANCER CENTER ONLY)
ALT: 20 U/L (ref 0–44)
AST: 25 U/L (ref 15–41)
Albumin: 3.6 g/dL (ref 3.5–5.0)
Alkaline Phosphatase: 81 U/L (ref 38–126)
Anion gap: 7 (ref 5–15)
BUN: 27 mg/dL — ABNORMAL HIGH (ref 8–23)
CO2: 27 mmol/L (ref 22–32)
Calcium: 9.2 mg/dL (ref 8.9–10.3)
Chloride: 97 mmol/L — ABNORMAL LOW (ref 98–111)
Creatinine: 1.5 mg/dL — ABNORMAL HIGH (ref 0.61–1.24)
GFR, Estimated: 49 mL/min — ABNORMAL LOW (ref >60)
Glucose, Bld: 90 mg/dL (ref 70–99)
Potassium: 4.2 mmol/L (ref 3.5–5.1)
Sodium: 131 mmol/L — ABNORMAL LOW (ref 135–145)
Total Bilirubin: 0.4 mg/dL (ref 0.0–1.2)
Total Protein: 6.9 g/dL (ref 6.5–8.1)

## 2024-07-12 LAB — CBC WITH DIFFERENTIAL (CANCER CENTER ONLY)
Abs Immature Granulocytes: 0 K/uL (ref 0.00–0.07)
Basophils Absolute: 0 K/uL (ref 0.0–0.1)
Basophils Relative: 1 %
Eosinophils Absolute: 0.1 K/uL (ref 0.0–0.5)
Eosinophils Relative: 2 %
HCT: 27 % — ABNORMAL LOW (ref 39.0–52.0)
Hemoglobin: 9.8 g/dL — ABNORMAL LOW (ref 13.0–17.0)
Immature Granulocytes: 0 %
Lymphocytes Relative: 42 %
Lymphs Abs: 1.2 K/uL (ref 0.7–4.0)
MCH: 37.1 pg — ABNORMAL HIGH (ref 26.0–34.0)
MCHC: 36.3 g/dL — ABNORMAL HIGH (ref 30.0–36.0)
MCV: 102.3 fL — ABNORMAL HIGH (ref 80.0–100.0)
Monocytes Absolute: 0.2 K/uL (ref 0.1–1.0)
Monocytes Relative: 6 %
Neutro Abs: 1.4 K/uL — ABNORMAL LOW (ref 1.7–7.7)
Neutrophils Relative %: 49 %
Platelet Count: 225 K/uL (ref 150–400)
RBC: 2.64 MIL/uL — ABNORMAL LOW (ref 4.22–5.81)
RDW: 12.7 % (ref 11.5–15.5)
Smear Review: NORMAL
WBC Count: 2.9 K/uL — ABNORMAL LOW (ref 4.0–10.5)
nRBC: 0 % (ref 0.0–0.2)

## 2024-07-15 ENCOUNTER — Other Ambulatory Visit: Payer: Self-pay

## 2024-07-18 ENCOUNTER — Inpatient Hospital Stay

## 2024-07-18 DIAGNOSIS — Z5111 Encounter for antineoplastic chemotherapy: Secondary | ICD-10-CM | POA: Diagnosis not present

## 2024-07-18 DIAGNOSIS — D709 Neutropenia, unspecified: Secondary | ICD-10-CM

## 2024-07-18 LAB — CBC WITH DIFFERENTIAL (CANCER CENTER ONLY)
Abs Immature Granulocytes: 0 K/uL (ref 0.00–0.07)
Basophils Absolute: 0 K/uL (ref 0.0–0.1)
Basophils Relative: 0 %
Eosinophils Absolute: 0.1 K/uL (ref 0.0–0.5)
Eosinophils Relative: 4 %
HCT: 26.2 % — ABNORMAL LOW (ref 39.0–52.0)
Hemoglobin: 9.6 g/dL — ABNORMAL LOW (ref 13.0–17.0)
Immature Granulocytes: 0 %
Lymphocytes Relative: 59 %
Lymphs Abs: 1.7 K/uL (ref 0.7–4.0)
MCH: 37.1 pg — ABNORMAL HIGH (ref 26.0–34.0)
MCHC: 36.6 g/dL — ABNORMAL HIGH (ref 30.0–36.0)
MCV: 101.2 fL — ABNORMAL HIGH (ref 80.0–100.0)
Monocytes Absolute: 0.4 K/uL (ref 0.1–1.0)
Monocytes Relative: 14 %
Neutro Abs: 0.7 K/uL — ABNORMAL LOW (ref 1.7–7.7)
Neutrophils Relative %: 23 %
Platelet Count: 155 K/uL (ref 150–400)
RBC: 2.59 MIL/uL — ABNORMAL LOW (ref 4.22–5.81)
RDW: 12.6 % (ref 11.5–15.5)
Smear Review: NORMAL
WBC Count: 2.9 K/uL — ABNORMAL LOW (ref 4.0–10.5)
nRBC: 0 % (ref 0.0–0.2)

## 2024-07-18 NOTE — Progress Notes (Signed)
 [facility] OFFICE PROGRESS NOTE  [name], MD [address] [place] [address]  DIAGNOSIS:  1) suspicious multifocal adenocarcinoma presented with hypermetabolic left lower lobe lung nodule suspicious for disease recurrence diagnosed in January 2025. 2)Stage IA (T1b, N0, M0) non-small cell lung cancer, adenocarcinoma.  The patient presented with a left lower lobe lung nodule.  She was diagnosed in February 2023.    Biomarker Findings Microsatellite status - Cannot Be Determined ? Tumor Mutational Burden - Cannot Be Determined Genomic Findings For a complete list of the genes assayed, please refer to the Appendix. KEAP1 W252C KRAS G12V STK11 E33* MUTYH G382D 7 Disease relevant genes with no reportable alterations: ALK, BRAF, EGFR, ERBB2, MET, RET, ROS1   PDL1 Expression: 0%  PRIOR THERAPY: 1) SBRT to the left lower lobe lung nodule under the care of Dr. Dewey completed on November 25, 2021 2) SBRT to the lingular nodule under the care of Dr. Dewey in February 2025  CURRENT THERAPY: Palliative systemic chemotherapy with carboplatin  for an AUC of 4, Alimta  375 mg/m2, and keytruda  200 mg IV every 3 weeks. First dose on 06/13/24. He is status post 2 cycles. His doses are reduced because he has one kidney.   INTERVAL HISTORY: Mahamud Metts 73 y.o. male returns to the clinic for a follow up visit. The patient recently found to have disease progression. He was started on chemo/immunotherapy. He tolerated this well without any significant adverse side effects except he more fatigue on Sunday/Monday after treatment.   He sometimes develops neutropenia. He denies any signs or symptoms of infection. He still tries to be active and works and walks. He walked 4.5 miles a few days ago.   No infections since his last visit, including no upper respiratory infections, skin infections, or urinary symptoms. No new or worsening cough, shortness of breath, or  fever.  He did not attend church and felt 'wiped out'. He reports improvement after Monday.  He has a history of having one kidney, which is being monitored during his treatments. He received a full bag of fluids with his last treatment, which he found helpful for hydration.  He experiences itchy, watery eyes and uses allergy pills and Refresh tears, although he forgot to use the tears recently due to fatigue. No nausea, vomiting, diarrhea, constipation, or headaches. He occasionally coughs and uses Tessalon , noting a slight improvement in his symptoms.  He has not used Miralax in the past four to five days and reports that it effectively manages his constipation when needed. He is here today for evaluation and repeat blood work before undergoing cycle #3.   MEDICAL HISTORY: Past Medical History:  Diagnosis Date   Arthritis    hands back,    Chronic kidney disease    Per Dr. Arloa but not with Dr. Alvaro   Hypertension    lung ca 2023   Renal cancer (HCC) 2021   Stroke (HCC) 1998   mild stroke like symptoms lasted 5 min.    ALLERGIES:  has no known allergies.  MEDICATIONS:  Current Outpatient Medications  Medication Sig Dispense Refill   ALPRAZolam  (XANAX ) 1 MG tablet Take 1.5 mg by mouth at bedtime.     Ascorbic Acid (VITAMIN C) 1000 MG tablet Take 1,000 mg by mouth daily. (Patient not taking: Reported on 06/03/2024)     aspirin EC 81 MG tablet Take 81 mg by mouth at bedtime. Swallow whole.     benzonatate  (  TESSALON ) 100 MG capsule Take 1 capsule (100 mg total) by mouth 3 (three) times daily as needed. 30 capsule 2   buPROPion (WELLBUTRIN) 75 MG tablet Take 10 mg by mouth 2 (two) times daily.     cetirizine (ZYRTEC) 10 MG tablet Take 10 mg by mouth daily.     chlorthalidone (HYGROTON) 25 MG tablet Take 25 mg by mouth at bedtime.     folic acid  (FOLVITE ) 1 MG tablet Take 1 tablet (1 mg total) by mouth daily. Start 7 days before pemetrexed  chemotherapy. Continue until 21 days after  pemetrexed  completed. 100 tablet 3   guaifenesin (HUMIBID E) 400 MG TABS tablet Take 400 mg by mouth in the morning and at bedtime.     ondansetron  (ZOFRAN ) 8 MG tablet Take 1 tablet (8 mg total) by mouth every 8 (eight) hours as needed for nausea or vomiting. Start on the third day after carboplatin . 30 tablet 1   Polyvinyl Alcohol-Povidone (CLEAR EYES ALL SEASONS) 5-6 MG/ML SOLN Place 1 drop into both eyes daily as needed (Dry eyes).     pravastatin (PRAVACHOL) 40 MG tablet Take 40 mg by mouth at bedtime.     prochlorperazine  (COMPAZINE ) 10 MG tablet Take 1 tablet (10 mg total) by mouth every 6 (six) hours as needed for nausea or vomiting. 30 tablet 1   sildenafil (VIAGRA) 100 MG tablet Take 100 mg by mouth daily as needed for erectile dysfunction.     valACYclovir (VALTREX) 500 MG tablet Take 500 mg by mouth daily.     No current facility-administered medications for this visit.    SURGICAL HISTORY:  Past Surgical History:  Procedure Laterality Date   BRONCHIAL BIOPSY  10/19/2021   Procedure: BRONCHIAL BIOPSIES;  Surgeon: Brenna Adine CROME, DO;  Location: [facility];  Service: Pulmonary;;   BRONCHIAL NEEDLE ASPIRATION BIOPSY  10/19/2021   Procedure: BRONCHIAL NEEDLE ASPIRATION BIOPSIES;  Surgeon: Brenna Adine CROME, DO;  Location: [facility];  Service: Pulmonary;;   FIDUCIAL MARKER PLACEMENT  10/19/2021   Procedure: FIDUCIAL MARKER PLACEMENT;  Surgeon: Brenna Adine CROME, DO;  Location: [facility];  Service: Pulmonary;;   HAND LIGAMENT RECONSTRUCTION Right 1990s   Saw injury   IR IMAGING GUIDED PORT INSERTION  06/18/2024   ROBOTIC ASSITED PARTIAL NEPHRECTOMY Left 07/15/2020   Procedure: XI ROBOTIC ASSITED RADICAL NEPHRECTOMY WITH INTRAOPERAATIVE ULTRASOUND;  Surgeon: Alvaro Hummer, MD;  Location: [facility];  Service: Urology;  Laterality: Left;  3 HRS   SHOULDER ARTHROSCOPY W/ ROTATOR CUFF REPAIR Left 08/2018   VIDEO BRONCHOSCOPY WITH RADIAL ENDOBRONCHIAL ULTRASOUND  10/19/2021    Procedure: RADIAL ENDOBRONCHIAL ULTRASOUND;  Surgeon: Brenna Adine CROME, DO;  Location: [facility];  Service: Pulmonary;;    REVIEW OF SYSTEMS:   Review of Systems  Constitutional: Positive for fatigue. Negative for appetite change, chills, fever and unexpected weight change.  HENT: Negative for mouth sores, nosebleeds, sore throat and trouble swallowing.   Eyes: Improved watery and itchy eyes. Negative for eye problems and icterus.  Respiratory: Denies significant shortness of breath and cough. Negative for hemoptysis and wheezing.     Cardiovascular: Negative for chest pain and leg swelling.  Gastrointestinal: Negative for abdominal pain, constipation (none at this time), diarrhea, nausea and vomiting.  Genitourinary: Negative for bladder incontinence, difficulty urinating, dysuria, frequency and hematuria.   Musculoskeletal: Negative for back pain, gait problem, neck pain and neck stiffness.  Skin: Negative for itching and rash.  Neurological: Negative for dizziness, extremity weakness, gait problem, headaches, light-headedness and seizures.  Hematological: Negative for adenopathy. Does not bruise/bleed easily.  Psychiatric/Behavioral: Negative for confusion, depression and sleep disturbance. The patient is not nervous/anxious.       PHYSICAL EXAMINATION:  Blood pressure 134/73, pulse 78, temperature (!) 97.2 F (36.2 C), temperature source Temporal, resp. rate 16, height 5' 11 (1.803 m), weight 173 lb (78.5 kg), SpO2 99%.  ECOG PERFORMANCE STATUS: 1  Physical Exam  Constitutional: Oriented to person, place, and time and well-developed, well-nourished, and in no distress.  HENT:  Head: Normocephalic and atraumatic.  Mouth/Throat: Oropharynx is clear and moist. No oropharyngeal exudate.  Eyes: Conjunctivae are normal. Right eye exhibits no discharge. Left eye exhibits no discharge. No scleral icterus.  Neck: Normal range of motion. Neck supple.  Cardiovascular: Normal rate,  regular rhythm, normal heart sounds and intact distal pulses.   Pulmonary/Chest: Effort normal and breath sounds normal. No respiratory distress. No wheezes. No rales.  Abdominal: Soft. Bowel sounds are normal. Exhibits no distension and no mass. There is no tenderness.  Musculoskeletal: Normal range of motion. Exhibits no edema.  Lymphadenopathy:    No cervical adenopathy.  Neurological: Alert and oriented to person, place, and time. Exhibits normal muscle tone. Gait normal. Coordination normal.  Skin: Skin is warm and dry. No rash noted. Not diaphoretic. No erythema. No pallor.  Psychiatric: Mood, memory and judgment normal.  Vitals reviewed.  LABORATORY DATA: Lab Results  Component Value Date   WBC 5.6 07/23/2024   HGB 9.6 (L) 07/23/2024   HCT 27.6 (L) 07/23/2024   MCV 105.7 (H) 07/23/2024   PLT 514 (H) 07/23/2024      Chemistry      Component Value Date/Time   NA 138 07/23/2024 0857   K 4.2 07/23/2024 0857   CL 103 07/23/2024 0857   CO2 26 07/23/2024 0857   BUN 14 07/23/2024 0857   CREATININE 1.48 (H) 07/23/2024 0857      Component Value Date/Time   CALCIUM 9.4 07/23/2024 0857   ALKPHOS 96 07/23/2024 0857   AST 31 07/23/2024 0857   ALT 25 07/23/2024 0857   BILITOT <0.2 07/23/2024 0857       RADIOGRAPHIC STUDIES:  No results found.   ASSESSMENT/PLAN:  This is a very pleasant 73 year old Caucasian male diagnosed with a stage Ia (T1b, N0, M0) non-small cell lung cancer presented with left lower lobe lung nodule diagnosed in February 2023 status post SBRT to the left lower lobe lung nodule completed on November 25, 2021.    He had repeat CT scan of the chest performed  Unfortunately his scan showed progressive left lung nodularities suspicious for recurrent/metastatic disease. This was followed by a PET scan and it showed a 2.0 x 0.9 cm nodule anteriorly in the lingula with maximum SUV of 2.5 and previously had SUV of 1.6 and suspicious for malignancy. He was treated  with SBRT to the lingular nodule under the care of Dr. Dewey February 2025.   He had another CT scan. The scan showed interval increase in the size and number of multiple pleural-based nodules in the left hemothorax highly concerning for worsening pleural metastasis. There was also interval increase in the size of the small left pleural effusion.    Dr. Sherrod started him on systemic chemotherapy/immunotherapy with carboplatin  for an AUC of 4, Alima 375 mg/m2, and Keytruda  200 mg IV every 3 weeks. He is status post 2 cycles.    Labs were reviewed. His creatinine is 1.48. Reviewed with Dr. Sherrod. His GFR is 48.  He has one kidney. His doses of chemo are reduced to carbo AUC of 4 and Alimta  375 mg/m2.  Recommend he proceed with cycle #3 tomorrow as scheduled. His significant other was inquireing about IVF with treatment to prevent kidney damage. We will arrange for 1/2 L over 1 hour with treatment but he was advised to hydrate well at home.   Due to the neutropenia following treatment, will add onpro with cycle #3 and 4.    I will arrange for a restaging CT of the CAP prior to his next treatment. I have ordered without contrast due to CKD.   Constipation Mild constipation managed with Miralax.  He will continue tessalon  for cough if needed.    Chemotherapy-induced ocular irritation and epiphora Ocular irritation and epiphora due to chemotherapy. Improved with artificial tears and antihistamines. - Continue using artificial tears and antihistamines as needed.   The patient was advised to call immediately if she has any concerning symptoms in the interval. The patient voices understanding of current disease status and treatment options and is in agreement with the current care plan. All questions were answered. The patient knows to call the clinic with any problems, questions or concerns. We can certainly see the patient much sooner if necessary    Orders Placed This Encounter  Procedures    CT CHEST ABDOMEN PELVIS WO CONTRAST    Standing Status:   Future    Expected Date:   08/06/2024    Expiration Date:   07/23/2025    Preferred imaging location?:   [facility]    If indicated for the ordered procedure, I authorize the administration of oral contrast media per Radiology protocol:   No    Reason for no oral contrast::   One kidney     The total time spent in the appointment was 20-29 minutes  Eyla Tallon L Tyon Cerasoli, PA-C 07/23/24

## 2024-07-19 ENCOUNTER — Other Ambulatory Visit: Payer: Self-pay | Admitting: Physician Assistant

## 2024-07-19 ENCOUNTER — Telehealth: Payer: Self-pay

## 2024-07-19 ENCOUNTER — Inpatient Hospital Stay

## 2024-07-19 VITALS — BP 128/75 | HR 103 | Resp 18 | Wt 174.0 lb

## 2024-07-19 DIAGNOSIS — Z5111 Encounter for antineoplastic chemotherapy: Secondary | ICD-10-CM | POA: Diagnosis not present

## 2024-07-19 DIAGNOSIS — D709 Neutropenia, unspecified: Secondary | ICD-10-CM

## 2024-07-19 MED ORDER — FILGRASTIM-SNDZ 300 MCG/0.5ML IJ SOSY
300.0000 ug | PREFILLED_SYRINGE | Freq: Once | INTRAMUSCULAR | Status: AC
  Administered 2024-07-19: 300 ug via SUBCUTANEOUS
  Filled 2024-07-19: qty 0.5

## 2024-07-19 NOTE — Telephone Encounter (Signed)
 Spoke with patient in regards to lab results.  Per [name], PA, patients WBC/ANC is trending down and offer patient Zarxio  injections for 2 days.  Patient stated that he can only come in today for 1 injection but he will be out of town tomorrow.  Appt made for 3 PM today for injection.  He voiced understanding.

## 2024-07-23 ENCOUNTER — Encounter: Payer: Self-pay | Admitting: Internal Medicine

## 2024-07-23 ENCOUNTER — Inpatient Hospital Stay

## 2024-07-23 ENCOUNTER — Inpatient Hospital Stay (HOSPITAL_BASED_OUTPATIENT_CLINIC_OR_DEPARTMENT_OTHER): Admitting: Physician Assistant

## 2024-07-23 VITALS — BP 134/73 | HR 78 | Temp 97.2°F | Resp 16 | Ht 71.0 in | Wt 173.0 lb

## 2024-07-23 DIAGNOSIS — Z5112 Encounter for antineoplastic immunotherapy: Secondary | ICD-10-CM

## 2024-07-23 DIAGNOSIS — C3432 Malignant neoplasm of lower lobe, left bronchus or lung: Secondary | ICD-10-CM | POA: Diagnosis not present

## 2024-07-23 DIAGNOSIS — Z5111 Encounter for antineoplastic chemotherapy: Secondary | ICD-10-CM | POA: Diagnosis not present

## 2024-07-23 LAB — CBC WITH DIFFERENTIAL (CANCER CENTER ONLY)
Abs Immature Granulocytes: 0.13 K/uL — ABNORMAL HIGH (ref 0.00–0.07)
Basophils Absolute: 0.1 K/uL (ref 0.0–0.1)
Basophils Relative: 1 %
Eosinophils Absolute: 0.3 K/uL (ref 0.0–0.5)
Eosinophils Relative: 6 %
HCT: 27.6 % — ABNORMAL LOW (ref 39.0–52.0)
Hemoglobin: 9.6 g/dL — ABNORMAL LOW (ref 13.0–17.0)
Immature Granulocytes: 2 %
Lymphocytes Relative: 33 %
Lymphs Abs: 1.8 K/uL (ref 0.7–4.0)
MCH: 36.8 pg — ABNORMAL HIGH (ref 26.0–34.0)
MCHC: 34.8 g/dL (ref 30.0–36.0)
MCV: 105.7 fL — ABNORMAL HIGH (ref 80.0–100.0)
Monocytes Absolute: 1.1 K/uL — ABNORMAL HIGH (ref 0.1–1.0)
Monocytes Relative: 19 %
Neutro Abs: 2.2 K/uL (ref 1.7–7.7)
Neutrophils Relative %: 39 %
Platelet Count: 514 K/uL — ABNORMAL HIGH (ref 150–400)
RBC: 2.61 MIL/uL — ABNORMAL LOW (ref 4.22–5.81)
RDW: 14.2 % (ref 11.5–15.5)
Smear Review: NORMAL
WBC Count: 5.6 K/uL (ref 4.0–10.5)
nRBC: 0.4 % — ABNORMAL HIGH (ref 0.0–0.2)

## 2024-07-23 LAB — CMP (CANCER CENTER ONLY)
ALT: 25 U/L (ref 0–44)
AST: 31 U/L (ref 15–41)
Albumin: 3.8 g/dL (ref 3.5–5.0)
Alkaline Phosphatase: 96 U/L (ref 38–126)
Anion gap: 9 (ref 5–15)
BUN: 14 mg/dL (ref 8–23)
CO2: 26 mmol/L (ref 22–32)
Calcium: 9.4 mg/dL (ref 8.9–10.3)
Chloride: 103 mmol/L (ref 98–111)
Creatinine: 1.48 mg/dL — ABNORMAL HIGH (ref 0.61–1.24)
GFR, Estimated: 50 mL/min — ABNORMAL LOW (ref >60)
Glucose, Bld: 95 mg/dL (ref 70–99)
Potassium: 4.2 mmol/L (ref 3.5–5.1)
Sodium: 138 mmol/L (ref 135–145)
Total Bilirubin: <0.2 mg/dL (ref 0.0–1.2)
Total Protein: 7.1 g/dL (ref 6.5–8.1)

## 2024-07-23 LAB — TSH: TSH: 2.52 u[IU]/mL (ref 0.350–4.500)

## 2024-07-23 NOTE — Progress Notes (Signed)
 Udenyca  added for neutropenia, ok per Dr. CHRISTELLA. No PA required.   Alfonso MARLA Buys, PharmD Pharmacy Resident  07/23/2024 12:23 PM

## 2024-07-24 ENCOUNTER — Other Ambulatory Visit: Payer: Self-pay | Admitting: Physician Assistant

## 2024-07-24 ENCOUNTER — Encounter: Payer: Self-pay | Admitting: Internal Medicine

## 2024-07-24 ENCOUNTER — Telehealth: Payer: Self-pay

## 2024-07-24 ENCOUNTER — Inpatient Hospital Stay

## 2024-07-24 VITALS — BP 121/74 | HR 81 | Temp 98.2°F | Resp 28

## 2024-07-24 DIAGNOSIS — C3432 Malignant neoplasm of lower lobe, left bronchus or lung: Secondary | ICD-10-CM

## 2024-07-24 DIAGNOSIS — Z5111 Encounter for antineoplastic chemotherapy: Secondary | ICD-10-CM | POA: Diagnosis not present

## 2024-07-24 LAB — T4: T4, Total: 8.3 ug/dL (ref 4.5–12.0)

## 2024-07-24 MED ORDER — CYANOCOBALAMIN 1000 MCG/ML IJ SOLN
1000.0000 ug | Freq: Once | INTRAMUSCULAR | Status: AC
  Administered 2024-07-24: 1000 ug via INTRAMUSCULAR
  Filled 2024-07-24: qty 1

## 2024-07-24 MED ORDER — DEXAMETHASONE SOD PHOSPHATE PF 10 MG/ML IJ SOLN
10.0000 mg | Freq: Once | INTRAMUSCULAR | Status: AC
  Administered 2024-07-24: 10 mg via INTRAVENOUS

## 2024-07-24 MED ORDER — PALONOSETRON HCL INJECTION 0.25 MG/5ML
0.2500 mg | Freq: Once | INTRAVENOUS | Status: AC
  Administered 2024-07-24: 0.25 mg via INTRAVENOUS
  Filled 2024-07-24: qty 5

## 2024-07-24 MED ORDER — SODIUM CHLORIDE 0.9 % IV SOLN
300.0000 mg | Freq: Once | INTRAVENOUS | Status: AC
  Administered 2024-07-24: 300 mg via INTRAVENOUS
  Filled 2024-07-24: qty 30

## 2024-07-24 MED ORDER — SODIUM CHLORIDE 0.9 % IV SOLN: INTRAVENOUS | Status: DC

## 2024-07-24 MED ORDER — SODIUM CHLORIDE 0.9 % IV SOLN
150.0000 mg | Freq: Once | INTRAVENOUS | Status: AC
  Administered 2024-07-24: 150 mg via INTRAVENOUS
  Filled 2024-07-24: qty 150

## 2024-07-24 MED ORDER — PEGFILGRASTIM-CBQV (INF DEV) 6 MG/0.6ML ~~LOC~~ SOSY
6.0000 mg | PREFILLED_SYRINGE | Freq: Once | SUBCUTANEOUS | Status: AC
  Administered 2024-07-24: 6 mg via SUBCUTANEOUS
  Filled 2024-07-24: qty 0.6

## 2024-07-24 MED ORDER — SODIUM CHLORIDE 0.9 % IV SOLN
200.0000 mg | Freq: Once | INTRAVENOUS | Status: AC
  Administered 2024-07-24: 200 mg via INTRAVENOUS
  Filled 2024-07-24: qty 200

## 2024-07-24 MED ORDER — SODIUM CHLORIDE 0.9 % IV SOLN
375.0000 mg/m2 | Freq: Once | INTRAVENOUS | Status: AC
  Administered 2024-07-24: 800 mg via INTRAVENOUS
  Filled 2024-07-24: qty 20

## 2024-07-24 MED ORDER — SODIUM CHLORIDE 0.9 % IV SOLN: Freq: Once | INTRAVENOUS | Status: AC

## 2024-07-24 NOTE — Telephone Encounter (Signed)
 Fax sent to [facility].

## 2024-07-24 NOTE — Patient Instructions (Signed)
 [facility] - A DEPT OF [facility]  Discharge Instructions: Thank you for choosing [facility] to provide your oncology and hematology care.   If you have a lab appointment with the Cancer Center, please go directly to the Cancer Center and check in at the registration area.   Wear comfortable clothing and clothing appropriate for easy access to any Portacath or PICC line.   We strive to give you quality time with your provider. You may need to reschedule your appointment if you arrive late (15 or more minutes).  Arriving late affects you and other patients whose appointments are after yours.  Also, if you miss three or more appointments without notifying the office, you may be dismissed from the clinic at the provider's discretion.      For prescription refill requests, have your pharmacy contact our office and allow 72 hours for refills to be completed.    Today you received the following chemotherapy and/or immunotherapy agents: Keytruda, Alimta, Carboplatin      To help prevent nausea and vomiting after your treatment, we encourage you to take your nausea medication as directed.  BELOW ARE SYMPTOMS THAT SHOULD BE REPORTED IMMEDIATELY: *FEVER GREATER THAN 100.4 F (38 C) OR HIGHER *CHILLS OR SWEATING *NAUSEA AND VOMITING THAT IS NOT CONTROLLED WITH YOUR NAUSEA MEDICATION *UNUSUAL SHORTNESS OF BREATH *UNUSUAL BRUISING OR BLEEDING *URINARY PROBLEMS (pain or burning when urinating, or frequent urination) *BOWEL PROBLEMS (unusual diarrhea, constipation, pain near the anus) TENDERNESS IN MOUTH AND THROAT WITH OR WITHOUT PRESENCE OF ULCERS (sore throat, sores in mouth, or a toothache) UNUSUAL RASH, SWELLING OR PAIN  UNUSUAL VAGINAL DISCHARGE OR ITCHING   Items with * indicate a potential emergency and should be followed up as soon as possible or go to the Emergency Department if any problems should occur.  Please show the CHEMOTHERAPY ALERT  CARD or IMMUNOTHERAPY ALERT CARD at check-in to the Emergency Department and triage nurse.  Should you have questions after your visit or need to cancel or reschedule your appointment, please contact [facility] - A DE[facility]  Dept: [phone]  and follow the prompts.  Office hours are 8:00 a.m. to 4:30 p.m. Monday - Friday. Please note that voicemails left after 4:00 p.m. may not be returned until the following business day.  We are closed weekends and major holidays. You have access to a nurse at all times for urgent questions. Please call the main number to the clinic Dept: [phone] and follow the prompts.   For any non-urgent questions, you may also contact your provider using MyChart. We now offer e-Visits for anyone 35 and older to request care online for non-urgent symptoms. For details visit [email or web address].   Also download the MyChart app! Go to the app store, search "MyChart", open the app, select [place], and log in with your MyChart username and password.

## 2024-07-26 ENCOUNTER — Inpatient Hospital Stay

## 2024-07-29 ENCOUNTER — Telehealth: Payer: Self-pay

## 2024-07-29 NOTE — Telephone Encounter (Signed)
 Received voicemail from patient reporting increased shortness of breath and weakness. Patient received treatment on 07/24/24 and noted that his symptoms are usually improved by the weekend. Offered the patient an appointment for labs at 8:45 AM and a visit with Dr. Sherrod at 9:45 AM on 07/30/24. Instructed the patient to go to the ER if symptoms worsen. He voiced understanding.

## 2024-07-30 ENCOUNTER — Inpatient Hospital Stay: Admitting: Internal Medicine

## 2024-07-30 ENCOUNTER — Inpatient Hospital Stay: Attending: Internal Medicine

## 2024-07-30 VITALS — BP 134/80 | HR 80 | Temp 97.8°F | Resp 17 | Ht 71.0 in | Wt 176.0 lb

## 2024-07-30 DIAGNOSIS — C3432 Malignant neoplasm of lower lobe, left bronchus or lung: Secondary | ICD-10-CM | POA: Insufficient documentation

## 2024-07-30 DIAGNOSIS — Z5111 Encounter for antineoplastic chemotherapy: Secondary | ICD-10-CM | POA: Insufficient documentation

## 2024-07-30 DIAGNOSIS — Z79899 Other long term (current) drug therapy: Secondary | ICD-10-CM | POA: Insufficient documentation

## 2024-07-30 DIAGNOSIS — F17200 Nicotine dependence, unspecified, uncomplicated: Secondary | ICD-10-CM | POA: Insufficient documentation

## 2024-07-30 DIAGNOSIS — K5900 Constipation, unspecified: Secondary | ICD-10-CM | POA: Insufficient documentation

## 2024-07-30 DIAGNOSIS — Z923 Personal history of irradiation: Secondary | ICD-10-CM | POA: Diagnosis not present

## 2024-07-30 DIAGNOSIS — Z95828 Presence of other vascular implants and grafts: Secondary | ICD-10-CM

## 2024-07-30 LAB — CBC WITH DIFFERENTIAL (CANCER CENTER ONLY)
Abs Immature Granulocytes: 0.22 K/uL — ABNORMAL HIGH (ref 0.00–0.07)
Basophils Absolute: 0.1 K/uL (ref 0.0–0.1)
Basophils Relative: 1 %
Eosinophils Absolute: 0 K/uL (ref 0.0–0.5)
Eosinophils Relative: 1 %
HCT: 26.7 % — ABNORMAL LOW (ref 39.0–52.0)
Hemoglobin: 9.5 g/dL — ABNORMAL LOW (ref 13.0–17.0)
Immature Granulocytes: 3 %
Lymphocytes Relative: 16 %
Lymphs Abs: 1.3 K/uL (ref 0.7–4.0)
MCH: 37.1 pg — ABNORMAL HIGH (ref 26.0–34.0)
MCHC: 35.6 g/dL (ref 30.0–36.0)
MCV: 104.3 fL — ABNORMAL HIGH (ref 80.0–100.0)
Monocytes Absolute: 0.3 K/uL (ref 0.1–1.0)
Monocytes Relative: 3 %
Neutro Abs: 6.3 K/uL (ref 1.7–7.7)
Neutrophils Relative %: 76 %
Platelet Count: 311 K/uL (ref 150–400)
RBC: 2.56 MIL/uL — ABNORMAL LOW (ref 4.22–5.81)
RDW: 14.2 % (ref 11.5–15.5)
Smear Review: NORMAL
WBC Count: 8.2 K/uL (ref 4.0–10.5)
nRBC: 0 % (ref 0.0–0.2)

## 2024-07-30 LAB — CMP (CANCER CENTER ONLY)
ALT: 23 U/L (ref 0–44)
AST: 43 U/L — ABNORMAL HIGH (ref 15–41)
Albumin: 3.9 g/dL (ref 3.5–5.0)
Alkaline Phosphatase: 215 U/L — ABNORMAL HIGH (ref 38–126)
Anion gap: 10 (ref 5–15)
BUN: 27 mg/dL — ABNORMAL HIGH (ref 8–23)
CO2: 26 mmol/L (ref 22–32)
Calcium: 9.4 mg/dL (ref 8.9–10.3)
Chloride: 99 mmol/L (ref 98–111)
Creatinine: 1.25 mg/dL — ABNORMAL HIGH (ref 0.61–1.24)
GFR, Estimated: >60 mL/min (ref >60)
Glucose, Bld: 95 mg/dL (ref 70–99)
Potassium: 3.9 mmol/L (ref 3.5–5.1)
Sodium: 135 mmol/L (ref 135–145)
Total Bilirubin: 0.6 mg/dL (ref 0.0–1.2)
Total Protein: 7.3 g/dL (ref 6.5–8.1)

## 2024-07-30 LAB — TSH: TSH: 3.01 u[IU]/mL (ref 0.350–4.500)

## 2024-07-30 MED ORDER — AZITHROMYCIN 250 MG PO TABS: ORAL_TABLET | ORAL | 0 refills | Status: AC

## 2024-07-30 NOTE — Progress Notes (Signed)
 [facility] [phone]   [phone]  OFFICE PROGRESS NOTE  [name], MD [address] [place] [address]  DIAGNOSIS:  1) multifocal adenocarcinoma presented with hypermetabolic left lower lobe lung nodule suspicious for disease recurrence diagnosed in January 2025. 2) Stage IA (T1b, N0, M0) non-small cell lung cancer, adenocarcinoma.  The patient presented with a left lower lobe lung nodule.  She was diagnosed in February 2023.   Biomarker Findings Microsatellite status - Cannot Be Determined ? Tumor Mutational Burden - Cannot Be Determined Genomic Findings For a complete list of the genes assayed, please refer to the Appendix. KEAP1 W252C KRAS G12V STK11 E33* MUTYH G382D 7 Disease relevant genes with no reportable alterations: ALK, BRAF, EGFR, ERBB2, MET, RET, ROS1  PDL1 Expression: 0%  PRIOR THERAPY:  1) SBRT to the left lower lobe lung nodule under the care of Dr. Dewey completed on November 25, 2021 2) SBRT to the lingular nodule under the care of Dr. Dewey in February 2025  CURRENT THERAPY: Palliative systemic chemoimmunotherapy with carboplatin  for AUC of 5, Alimta  500 mg/M2 and Keytruda  200 mg IV every 3 weeks status post 3 cycles.  INTERVAL HISTORY: Chris Holloway 73 y.o. male returns to the clinic today for follow-up visit.Discussed the use of AI scribe software for clinical note transcription with the patient, who gave verbal consent to proceed.  History of Present Illness Chris Holloway is a 73 year old male with multifocal adenocarcinoma who presents with multiple pulmonary nodules and respiratory symptoms.  He was diagnosed with multifocal adenocarcinoma in January 2021 and has been receiving Keytruda  every three weeks. After his treatment last Wednesday, he usually recovers after three or four days, but currently feels as though he has 'walking pneumonia'.  He experiences a deep chest cough  and rhinorrhea, which he attributes to the weather. No fever or chills, but he feels weak and short of breath. He coughs up clear mucus and has not experienced any nausea or vomiting.  He received an injection for his white blood count on Thursday night at 7:30 PM. He takes Claritin daily but has not used any other medications like Tylenol . He feels a lack of energy but denies any generalized aching.  His white blood count is 8.2. He is not allergic to any antibiotics.    MEDICAL HISTORY: Past Medical History:  Diagnosis Date   Arthritis    hands back,    Chronic kidney disease    Per Dr. Arloa but not with Dr. Alvaro   Hypertension    lung ca 2023   Renal cancer (HCC) 2021   Stroke (HCC) 1998   mild stroke like symptoms lasted 5 min.    ALLERGIES:  has no known allergies.  MEDICATIONS:  Current Outpatient Medications  Medication Sig Dispense Refill   ALPRAZolam  (XANAX ) 1 MG tablet Take 1.5 mg by mouth at bedtime.     Ascorbic Acid (VITAMIN C) 1000 MG tablet Take 1,000 mg by mouth daily. (Patient not taking: Reported on 06/03/2024)     aspirin EC 81 MG tablet Take 81 mg by mouth at bedtime. Swallow whole.     benzonatate  (TESSALON ) 100 MG capsule Take 1 capsule (100 mg total) by mouth 3 (three) times daily as needed. 30 capsule 2   buPROPion (WELLBUTRIN) 75 MG tablet Take 10 mg by mouth 2 (two) times daily.     cetirizine (ZYRTEC) 10 MG tablet Take  10 mg by mouth daily.     chlorthalidone (HYGROTON) 25 MG tablet Take 25 mg by mouth at bedtime.     folic acid  (FOLVITE ) 1 MG tablet Take 1 tablet (1 mg total) by mouth daily. Start 7 days before pemetrexed  chemotherapy. Continue until 21 days after pemetrexed  completed. 100 tablet 3   guaifenesin (HUMIBID E) 400 MG TABS tablet Take 400 mg by mouth in the morning and at bedtime.     ondansetron  (ZOFRAN ) 8 MG tablet Take 1 tablet (8 mg total) by mouth every 8 (eight) hours as needed for nausea or vomiting. Start on the third day after  carboplatin . 30 tablet 1   Polyvinyl Alcohol-Povidone (CLEAR EYES ALL SEASONS) 5-6 MG/ML SOLN Place 1 drop into both eyes daily as needed (Dry eyes).     pravastatin (PRAVACHOL) 40 MG tablet Take 40 mg by mouth at bedtime.     prochlorperazine  (COMPAZINE ) 10 MG tablet Take 1 tablet (10 mg total) by mouth every 6 (six) hours as needed for nausea or vomiting. 30 tablet 1   sildenafil (VIAGRA) 100 MG tablet Take 100 mg by mouth daily as needed for erectile dysfunction.     valACYclovir (VALTREX) 500 MG tablet Take 500 mg by mouth daily.     No current facility-administered medications for this visit.    SURGICAL HISTORY:  Past Surgical History:  Procedure Laterality Date   BRONCHIAL BIOPSY  10/19/2021   Procedure: BRONCHIAL BIOPSIES;  Surgeon: Brenna Adine CROME, DO;  Location: [facility];  Service: Pulmonary;;   BRONCHIAL NEEDLE ASPIRATION BIOPSY  10/19/2021   Procedure: BRONCHIAL NEEDLE ASPIRATION BIOPSIES;  Surgeon: Brenna Adine CROME, DO;  Location: [facility];  Service: Pulmonary;;   FIDUCIAL MARKER PLACEMENT  10/19/2021   Procedure: FIDUCIAL MARKER PLACEMENT;  Surgeon: Brenna Adine CROME, DO;  Location: [facility];  Service: Pulmonary;;   HAND LIGAMENT RECONSTRUCTION Right 1990s   Saw injury   IR IMAGING GUIDED PORT INSERTION  06/18/2024   ROBOTIC ASSITED PARTIAL NEPHRECTOMY Left 07/15/2020   Procedure: XI ROBOTIC ASSITED RADICAL NEPHRECTOMY WITH INTRAOPERAATIVE ULTRASOUND;  Surgeon: Alvaro Hummer, MD;  Location: [facility];  Service: Urology;  Laterality: Left;  3 HRS   SHOULDER ARTHROSCOPY W/ ROTATOR CUFF REPAIR Left 08/2018   VIDEO BRONCHOSCOPY WITH RADIAL ENDOBRONCHIAL ULTRASOUND  10/19/2021   Procedure: RADIAL ENDOBRONCHIAL ULTRASOUND;  Surgeon: Brenna Adine CROME, DO;  Location: [facility];  Service: Pulmonary;;    REVIEW OF SYSTEMS:  Constitutional: positive for fatigue Eyes: negative Ears, nose, mouth, throat, and face: negative Respiratory: positive for cough and  sputum Cardiovascular: negative Gastrointestinal: negative Genitourinary:negative Integument/breast: negative Hematologic/lymphatic: negative Musculoskeletal:negative Neurological: negative Behavioral/Psych: negative Endocrine: negative Allergic/Immunologic: negative   PHYSICAL EXAMINATION: General appearance: alert, cooperative, fatigued, and no distress Head: Normocephalic, without obvious abnormality, atraumatic Neck: no adenopathy, no JVD, supple, symmetrical, trachea midline, and thyroid  not enlarged, symmetric, no tenderness/mass/nodules Lymph nodes: Cervical, supraclavicular, and axillary nodes normal. Resp: clear to auscultation bilaterally Back: symmetric, no curvature. ROM normal. No CVA tenderness. Cardio: regular rate and rhythm, S1, S2 normal, no murmur, click, rub or gallop GI: soft, non-tender; bowel sounds normal; no masses,  no organomegaly Extremities: extremities normal, atraumatic, no cyanosis or edema Neurologic: Alert and oriented X 3, normal strength and tone. Normal symmetric reflexes. Normal coordination and gait  ECOG PERFORMANCE STATUS: 1 - Symptomatic but completely ambulatory  Blood pressure 134/80, pulse 80, temperature 97.8 F (36.6 C), temperature source Temporal, resp. rate 17, height 5' 11 (1.803 m), weight 176 lb (79.8  kg), SpO2 100%.  LABORATORY DATA: Lab Results  Component Value Date   WBC 8.2 07/30/2024   HGB 9.5 (L) 07/30/2024   HCT 26.7 (L) 07/30/2024   MCV 104.3 (H) 07/30/2024   PLT 311 07/30/2024      Chemistry      Component Value Date/Time   NA 138 07/23/2024 0857   K 4.2 07/23/2024 0857   CL 103 07/23/2024 0857   CO2 26 07/23/2024 0857   BUN 14 07/23/2024 0857   CREATININE 1.48 (H) 07/23/2024 0857      Component Value Date/Time   CALCIUM 9.4 07/23/2024 0857   ALKPHOS 96 07/23/2024 0857   AST 31 07/23/2024 0857   ALT 25 07/23/2024 0857   BILITOT <0.2 07/23/2024 0857       RADIOGRAPHIC STUDIES: No results  found.   ASSESSMENT AND PLAN: This is a very pleasant 73 years old white male diagnosed with  multifocal adenocarcinoma presented with hypermetabolic left lower lobe lung nodule suspicious for disease recurrence diagnosed in January 2025. 2)Stage IA (T1b, N0, M0) non-small cell lung cancer, adenocarcinoma.  The patient presented with a left lower lobe lung nodule.  She was diagnosed in February 2023.  Unfortunately his scan showed progressive left lung nodularities suspicious for recurrent/metastatic disease. This was followed by a PET scan and it showed a 2.0 x 0.9 cm nodule anteriorly in the lingula with maximum SUV of 2.5 and previously had SUV of 1.6 and suspicious for malignancy. He was treated with SBRT to the lingular nodule under the care of Dr. Dewey February 2025. The patient is currently undergoing systemic chemotherapy with carboplatin  for AUC of 5, Alimta  500 mg/M2 and Keytruda  200 mg IV every 3 weeks status post 3 cycles.  He has been tolerating his treatment fairly well except for chemotherapy-induced neutropenia and we added Onpro injection with the last cycle of his treatment.  He has more aching and fatigue as well as chest congestion recently. Assessment and Plan Assessment & Plan Multifocal pulmonary adenocarcinoma on systemic chemoimmunotherapy with carboplatin , Alimta  and Keytruda  Multifocal pulmonary adenocarcinoma diagnosed in January 2021, currently on carboplatin , Alimta  and Keytruda  every three weeks.  He received Onpro injection with the last cycle of his treatment.  Blood counts are well-managed with white blood count at 8.2, hemoglobin at 9.5, and stable platelets. - Continue chemoimmunotherapy with carboplatin , Alimta  and Keytruda  every three weeks - Will perform scan next week - Will follow up in two weeks  Acute cough and congestion, possible bronchitis Acute cough with clear mucus production and nasal congestion, possibly due to bronchitis. No fever or chills  reported. White blood count is normal, reducing likelihood of bacterial infection, but antibiotics prescribed as a precaution. - Prescribed Z-Pak: two capsules on the first day, then one capsule daily for the next four days - Advised to hydrate well  Fatigue and weakness Reports of fatigue and weakness, possibly related to recent Onbro injection which can cause malaise. No significant aching reported. - Advised to hydrate well The patient was advised to call immediately if she has any concerning symptoms in the interval. The patient voices understanding of current disease status and treatment options and is in agreement with the current care plan.  All questions were answered. The patient knows to call the clinic with any problems, questions or concerns. We can certainly see the patient much sooner if necessary.  The total time spent in the appointment was 30 minutes.  Disclaimer: This note was dictated with voice recognition software.  Similar sounding words can inadvertently be transcribed and may not be corrected upon review.

## 2024-07-31 LAB — T4: T4, Total: 9.3 ug/dL (ref 4.5–12.0)

## 2024-08-05 ENCOUNTER — Telehealth: Payer: Self-pay

## 2024-08-05 NOTE — Telephone Encounter (Signed)
 The patient called to report that his left nipple has been hard and puffy around it for about a week. The patient denies any associated symptoms such as fever, redness, warmth, a lump or knot around the nipple, or discharge. The patient denies any recent trauma or injury to the chest.  I informed Dr. Sherrod of the patient's concerns, and his recommendation was to apply heat to the area, as this could be due to inflammation. He advised the patient to monitor the area over the next few days and to call our office if symptoms do not improve.  The patient voiced understanding of the plan and will follow up if symptoms persist.

## 2024-08-06 ENCOUNTER — Ambulatory Visit (HOSPITAL_COMMUNITY)
Admission: RE | Admit: 2024-08-06 | Discharge: 2024-08-06 | Disposition: A | Source: Ambulatory Visit | Attending: Physician Assistant

## 2024-08-06 DIAGNOSIS — C3432 Malignant neoplasm of lower lobe, left bronchus or lung: Secondary | ICD-10-CM

## 2024-08-08 ENCOUNTER — Other Ambulatory Visit: Payer: Self-pay | Admitting: Internal Medicine

## 2024-08-08 ENCOUNTER — Other Ambulatory Visit: Payer: Self-pay

## 2024-08-08 DIAGNOSIS — C3432 Malignant neoplasm of lower lobe, left bronchus or lung: Secondary | ICD-10-CM

## 2024-08-09 ENCOUNTER — Inpatient Hospital Stay

## 2024-08-09 ENCOUNTER — Telehealth: Payer: Self-pay | Admitting: *Deleted

## 2024-08-09 DIAGNOSIS — Z5111 Encounter for antineoplastic chemotherapy: Secondary | ICD-10-CM | POA: Diagnosis not present

## 2024-08-09 DIAGNOSIS — C3432 Malignant neoplasm of lower lobe, left bronchus or lung: Secondary | ICD-10-CM

## 2024-08-09 LAB — CMP (CANCER CENTER ONLY)
ALT: 29 U/L (ref 0–44)
AST: 30 U/L (ref 15–41)
Albumin: 4.1 g/dL (ref 3.5–5.0)
Alkaline Phosphatase: 133 U/L — ABNORMAL HIGH (ref 38–126)
Anion gap: 12 (ref 5–15)
BUN: 26 mg/dL — ABNORMAL HIGH (ref 8–23)
CO2: 25 mmol/L (ref 22–32)
Calcium: 9.6 mg/dL (ref 8.9–10.3)
Chloride: 100 mmol/L (ref 98–111)
Creatinine: 1.58 mg/dL — ABNORMAL HIGH (ref 0.61–1.24)
GFR, Estimated: 46 mL/min — ABNORMAL LOW (ref >60)
Glucose, Bld: 117 mg/dL — ABNORMAL HIGH (ref 70–99)
Potassium: 3.7 mmol/L (ref 3.5–5.1)
Sodium: 136 mmol/L (ref 135–145)
Total Bilirubin: 0.2 mg/dL (ref 0.0–1.2)
Total Protein: 7.8 g/dL (ref 6.5–8.1)

## 2024-08-09 LAB — CBC WITH DIFFERENTIAL (CANCER CENTER ONLY)
Abs Immature Granulocytes: 0.57 K/uL — ABNORMAL HIGH (ref 0.00–0.07)
Basophils Absolute: 0.1 K/uL (ref 0.0–0.1)
Basophils Relative: 0 %
Eosinophils Absolute: 0 K/uL (ref 0.0–0.5)
Eosinophils Relative: 0 %
HCT: 26.5 % — ABNORMAL LOW (ref 39.0–52.0)
Hemoglobin: 9.4 g/dL — ABNORMAL LOW (ref 13.0–17.0)
Immature Granulocytes: 3 %
Lymphocytes Relative: 14 %
Lymphs Abs: 2.9 K/uL (ref 0.7–4.0)
MCH: 37.3 pg — ABNORMAL HIGH (ref 26.0–34.0)
MCHC: 35.5 g/dL (ref 30.0–36.0)
MCV: 105.2 fL — ABNORMAL HIGH (ref 80.0–100.0)
Monocytes Absolute: 1.7 K/uL — ABNORMAL HIGH (ref 0.1–1.0)
Monocytes Relative: 8 %
Neutro Abs: 14.7 K/uL — ABNORMAL HIGH (ref 1.7–7.7)
Neutrophils Relative %: 75 %
Platelet Count: 282 K/uL (ref 150–400)
RBC: 2.52 MIL/uL — ABNORMAL LOW (ref 4.22–5.81)
RDW: 14.8 % (ref 11.5–15.5)
WBC Count: 19.9 K/uL — ABNORMAL HIGH (ref 4.0–10.5)
nRBC: 0.1 % (ref 0.0–0.2)

## 2024-08-09 NOTE — Telephone Encounter (Signed)
 Patient called to states he had labs today.  Received a message from my chart regarding his CBC results.  Concerned with WBC 19.9 and RBC 2.52. Wants to know if anything needs to be done? Also, states he had a CT scan done on 08/06/24.  Wants to know the results of Scan.  Results are not finalized per the computer.  Next appointment for office visit is 08/15/24.  Please advise.

## 2024-08-09 NOTE — Telephone Encounter (Signed)
 Return call to patient regarding his concerns with the elevated WBC and CT scan results.  Advised him per [name]/PA that the elevated WBC could be due to the injection Neulasta  that he received for this cycle.  Since he's not having any symptoms such as fever or chills.  She states the injection could cause the elevated WBC.  Regarding the CT scan, informed him that the results haven't appeared in EPIC yet.  Meaning it's because the Radiologist have finalized the results.  We can't see the results just as he can't see them until then.  He was advised that the results may appear in Epic over the weekends.  If something is urgent, then he will be notified soon verses later.  Otherwise the results will be discussed in details at his follow up appointment on 08/15/2024.  Patient verbalized understanding

## 2024-08-12 NOTE — Progress Notes (Unsigned)
 [facility] OFFICE PROGRESS NOTE  [name], MD [address] [place] [address]  DIAGNOSIS:  1) suspicious multifocal adenocarcinoma presented with hypermetabolic left lower lobe lung nodule suspicious for disease recurrence diagnosed in January 2025. 2)Stage IA (T1b, N0, M0) non-small cell lung cancer, adenocarcinoma.  The patient presented with a left lower lobe lung nodule.  She was diagnosed in February 2023.    Biomarker Findings Microsatellite status - Cannot Be Determined ? Tumor Mutational Burden - Cannot Be Determined Genomic Findings For a complete list of the genes assayed, please refer to the Appendix. KEAP1 W252C KRAS G12V STK11 E33* MUTYH G382D 7 Disease relevant genes with no reportable alterations: ALK, BRAF, EGFR, ERBB2, MET, RET, ROS1   PDL1 Expression: 0%  PRIOR THERAPY: 1) SBRT to the left lower lobe lung nodule under the care of Dr. Dewey completed on November 25, 2021 2) SBRT to the lingular nodule under the care of Dr. Dewey in February 2025  CURRENT THERAPY: Palliative systemic chemotherapy with carboplatin  for an AUC of 4, Alimta  375 mg/m2, and keytruda  200 mg IV every 3 weeks. First dose on 06/13/24. He is status post 3 cycles. His doses are reduced because he has one kidney.   INTERVAL HISTORY: Chris Holloway 73 y.o. male returns to the clinic today for a follow-up visit.  The patient was last seen in the clinic 3 weeks ago by Dr. Sherrod.  The patient is currently on chemotherapy and immunotherapy.  He is on dose reduced therapy due to having 1 kidney.  He also had some neutropenia and received G-CSF injections.  He did have some fatigue following the onpro. He was treated in interval for possible bronchitis with azithromycin .   He denies fever, chills, night sweats, or appetite change. His shortness of breath is stable. He sometimes has intermittent cough which produces clear phlegm. He denies hemoptysis. He has  some left breast chest soreness. He rates is 2/10. He does have watery eyes from alimta . He denies heavy lifting. No overlying or skin changes. He denies rashes or skin changes. He denies nausea, vomiting, or diarrhea. He sometimes has constipation and uses miralax. He recently had a restaging CT scan.   He is here today for evaluation and repeat blood work before undergoing cycle #4.     MEDICAL HISTORY: Past Medical History:  Diagnosis Date   Arthritis    hands back,    Chronic kidney disease    Per Dr. Arloa but not with Dr. Alvaro   Hypertension    lung ca 2023   Renal cancer (HCC) 2021   Stroke (HCC) 1998   mild stroke like symptoms lasted 5 min.    ALLERGIES:  has no known allergies.  MEDICATIONS:  Current Outpatient Medications  Medication Sig Dispense Refill   ALPRAZolam  (XANAX ) 1 MG tablet Take 1.5 mg by mouth at bedtime.     Ascorbic Acid (VITAMIN C) 1000 MG tablet Take 1,000 mg by mouth daily. (Patient not taking: Reported on 06/03/2024)     aspirin EC 81 MG tablet Take 81 mg by mouth at bedtime. Swallow whole.     azithromycin  (ZITHROMAX ) 250 MG tablet Use as instructed 6 each 0   benzonatate  (TESSALON ) 100 MG capsule Take 1 capsule (100 mg total) by mouth 3 (three) times daily as needed. 30 capsule 2   buPROPion (WELLBUTRIN) 75 MG tablet Take 10 mg by mouth 2 (two) times daily.     cetirizine (  ZYRTEC) 10 MG tablet Take 10 mg by mouth daily.     chlorthalidone (HYGROTON) 25 MG tablet Take 25 mg by mouth at bedtime.     folic acid  (FOLVITE ) 1 MG tablet Take 1 tablet (1 mg total) by mouth daily. Start 7 days before pemetrexed  chemotherapy. Continue until 21 days after pemetrexed  completed. 100 tablet 3   guaifenesin (HUMIBID E) 400 MG TABS tablet Take 400 mg by mouth in the morning and at bedtime.     ondansetron  (ZOFRAN ) 8 MG tablet Take 1 tablet (8 mg total) by mouth every 8 (eight) hours as needed for nausea or vomiting. Start on the third day after carboplatin . 30  tablet 1   Polyvinyl Alcohol-Povidone (CLEAR EYES ALL SEASONS) 5-6 MG/ML SOLN Place 1 drop into both eyes daily as needed (Dry eyes).     pravastatin (PRAVACHOL) 40 MG tablet Take 40 mg by mouth at bedtime.     prochlorperazine  (COMPAZINE ) 10 MG tablet Take 1 tablet (10 mg total) by mouth every 6 (six) hours as needed for nausea or vomiting. 30 tablet 1   sildenafil (VIAGRA) 100 MG tablet Take 100 mg by mouth daily as needed for erectile dysfunction.     valACYclovir (VALTREX) 500 MG tablet Take 500 mg by mouth daily.     No current facility-administered medications for this visit.    SURGICAL HISTORY:  Past Surgical History:  Procedure Laterality Date   BRONCHIAL BIOPSY  10/19/2021   Procedure: BRONCHIAL BIOPSIES;  Surgeon: Brenna Adine CROME, DO;  Location: [facility];  Service: Pulmonary;;   BRONCHIAL NEEDLE ASPIRATION BIOPSY  10/19/2021   Procedure: BRONCHIAL NEEDLE ASPIRATION BIOPSIES;  Surgeon: Brenna Adine CROME, DO;  Location: [facility];  Service: Pulmonary;;   FIDUCIAL MARKER PLACEMENT  10/19/2021   Procedure: FIDUCIAL MARKER PLACEMENT;  Surgeon: Brenna Adine CROME, DO;  Location: [facility];  Service: Pulmonary;;   HAND LIGAMENT RECONSTRUCTION Right 1990s   Saw injury   IR IMAGING GUIDED PORT INSERTION  06/18/2024   ROBOTIC ASSITED PARTIAL NEPHRECTOMY Left 07/15/2020   Procedure: XI ROBOTIC ASSITED RADICAL NEPHRECTOMY WITH INTRAOPERAATIVE ULTRASOUND;  Surgeon: Alvaro Hummer, MD;  Location: [facility];  Service: Urology;  Laterality: Left;  3 HRS   SHOULDER ARTHROSCOPY W/ ROTATOR CUFF REPAIR Left 08/2018   VIDEO BRONCHOSCOPY WITH RADIAL ENDOBRONCHIAL ULTRASOUND  10/19/2021   Procedure: RADIAL ENDOBRONCHIAL ULTRASOUND;  Surgeon: Brenna Adine CROME, DO;  Location: [facility];  Service: Pulmonary;;    REVIEW OF SYSTEMS:   Review of Systems  Constitutional: Stable fatigue after GCSF injection. Negative for appetite change, chills, fever and unexpected weight change.  HENT: Negative  for mouth sores, nosebleeds, sore throat and trouble swallowing.   Eyes: Improved watery and itchy eyes. Negative for eye problems and icterus.  Respiratory: Occasional cough and stable dyspnea on exertion. Negative for  hemoptysis and wheezing.   Cardiovascular: Positive left breast soreness. Negative for chest pain and leg swelling.  Gastrointestinal: Occasional constipation. Negative for abdominal pain,  diarrhea, nausea and vomiting.  Genitourinary: Negative for bladder incontinence, difficulty urinating, dysuria, frequency and hematuria.   Musculoskeletal: Negative for back pain, gait problem, neck pain and neck stiffness.  Skin: Negative for itching and rash.  Neurological: Negative for dizziness, extremity weakness, gait problem, headaches, light-headedness and seizures.  Hematological: Negative for adenopathy. Does not bruise/bleed easily.  Psychiatric/Behavioral: Negative for confusion, depression and sleep disturbance. The patient is not nervous/anxious.     PHYSICAL EXAMINATION:  There were no vitals taken for this visit.  ECOG PERFORMANCE STATUS: 1  Physical Exam  Constitutional: Oriented to person, place, and time and well-developed, well-nourished, and in no distress.  HENT:  Head: Normocephalic and atraumatic.  Mouth/Throat: Oropharynx is clear and moist. No oropharyngeal exudate.  Eyes: Conjunctivae are normal. Right eye exhibits no discharge. Left eye exhibits no discharge. No scleral icterus.  Neck: Normal range of motion. Neck supple.  Cardiovascular: Normal rate, regular rhythm, normal heart sounds and intact distal pulses.   Pulmonary/Chest: Effort normal and breath sounds normal. No respiratory distress. No wheezes. No rales.  Abdominal: Soft. Bowel sounds are normal. Exhibits no distension and no mass. There is no tenderness.  Musculoskeletal: Normal range of motion. Exhibits no edema.  Lymphadenopathy:    No cervical adenopathy.  Neurological: Alert and oriented  to person, place, and time. Exhibits normal muscle tone. Gait normal. Coordination normal.  Skin: Skin is warm and dry. No rash noted. Not diaphoretic. No erythema. No pallor.  Psychiatric: Mood, memory and judgment normal.  Vitals reviewed.  LABORATORY DATA: Lab Results  Component Value Date   WBC 19.9 (H) 08/09/2024   HGB 9.4 (L) 08/09/2024   HCT 26.5 (L) 08/09/2024   MCV 105.2 (H) 08/09/2024   PLT 282 08/09/2024      Chemistry      Component Value Date/Time   NA 136 08/09/2024 0929   K 3.7 08/09/2024 0929   CL 100 08/09/2024 0929   CO2 25 08/09/2024 0929   BUN 26 (H) 08/09/2024 0929   CREATININE 1.58 (H) 08/09/2024 0929      Component Value Date/Time   CALCIUM 9.6 08/09/2024 0929   ALKPHOS 133 (H) 08/09/2024 0929   AST 30 08/09/2024 0929   ALT 29 08/09/2024 0929   BILITOT 0.2 08/09/2024 0929       RADIOGRAPHIC STUDIES:  No results found.   ASSESSMENT/PLAN:  This is a very pleasant 73 year old Caucasian male diagnosed with a stage Ia (T1b, N0, M0) non-small cell lung cancer presented with left lower lobe lung nodule diagnosed in February 2023 status post SBRT to the left lower lobe lung nodule completed on November 25, 2021.    He had repeat CT scan of the chest performed  Unfortunately his scan showed progressive left lung nodularities suspicious for recurrent/metastatic disease. This was followed by a PET scan and it showed a 2.0 x 0.9 cm nodule anteriorly in the lingula with maximum SUV of 2.5 and previously had SUV of 1.6 and suspicious for malignancy. He was treated with SBRT to the lingular nodule under the care of Dr. Dewey February 2025.   He had another CT scan. The scan showed interval increase in the size and number of multiple pleural-based nodules in the left hemothorax highly concerning for worsening pleural metastasis. There was also interval increase in the size of the small left pleural effusion.    Dr. Sherrod started him on systemic  chemotherapy/immunotherapy with carboplatin  for an AUC of 4, Alima 375 mg/m2, and Keytruda  200 mg IV every 3 weeks. He is status post 3 cycles.  Onpro was added from cycle 2 due to neutropenia.  The patient was seen with Dr. Sherrod today.  Dr. Sherrod personally and independently reviewed the scan and discussed results with the patient today.  The scan showed that radiology measured the left lung lesion minimally more enlarged and new mildly enlarged lymph nodes.  Dr. Sherrod reviewed the imaging. Second line chemotherapy has a lot of side effects. Dr. Sherrod would recommend closemonitoring for now.  He will continues with cycle #4 today as scheduled.   I have removed alimta  from the care plan due to his elevated creatinine today of 1.68. We will arrange 1/2 L IVF today. I also removed his onpro.   His left chest soreness could be musculoskeletal. Did not appreciate any concerns on his imaging.   We will arrange restaging brain MRI.   We will arrange for second opinion with Dr. Valentino at [facility] per patient request.   Constipation Mild constipation managed with Miralax.    Chemotherapy-induced ocular irritation and epiphora Ocular irritation and epiphora due to chemotherapy. Improved with artificial tears and antihistamines. - Continue using artificial tears and antihistamines as needed.  The patient was advised to call immediately if he has any concerning symptoms in the interval. The patient voices understanding of current disease status and treatment options and is in agreement with the current care plan. All questions were answered. The patient knows to call the clinic with any problems, questions or concerns. We can certainly see the patient much sooner if necessary  No orders of the defined types were placed in this encounter.   Loni Abdon L Anyi Fels, PA-C 08/12/2024  ADDENDUM: Hematology/Oncology Attending:  I had a face-to-face encounter with the patient today.  I reviewed  his record, lab, scan and recommended his care plan.  He was in the clinic accompanied by his girlfriend and his granddaughter.  This is a very pleasant 89 years old white male with multifocal adenocarcinoma with multiple pulmonary nodules and hypermetabolic left lower lobe lung mass that was initially diagnosed in January 2025.  He also has a stage Ia non-small cell lung cancer, adenocarcinoma diagnosed in February 2023.  He has no actionable mutations and negative PD-L1 expression.  He underwent treatment with local therapy in the past including SBRT to the left lower lobe lung nodule in March 2023 in addition to SBRT to the lingular nodule in February 2025.  When the patient developed multiple pulmonary metastasis.  He started palliative systemic chemotherapy with carboplatin  for a reduced dose of AUC of 4 in addition to reduced dose L) 375 mg/M2 and Keytruda  200 mg IV every 3 weeks.  The dose reduction is because of his renal insufficiency and 1 kidney.  He received 3 cycles of this treatment.  He presented to the clinic today with his family.  He is feeling fine with no concerning complaints except for the baseline shortness of breath.  His previous treatment was complicated with neutropenia and the patient received Onpro injection after the last cycle of his treatment.  He was also treated with Z-Pak for suspicious bronchitis.  The patient had repeat CT scan of the chest, abdomen and pelvis performed recently.  I personally and independently reviewed the scan results and showed the images to the patient and his family.  His scan showed minimally increased medial left lower lobe lung mass measuring 4.9 x 2.7 cm consistent with slight progression of the primary tumor burden.  There was also new mildly enlarged lower right paratracheal and right subcarinal lymph nodes still suspicious for new nodal metastasis.  There was also small layering left pleural effusion mildly increased and no new or enlarging pulmonary  nodules.  There are numerous bilateral pulmonary nodules that were seen before with no significant changes.  There is no evidence of metastatic disease in the abdomen or pelvis.  Unfortunately the patient continues to smoke.  He has no actionable mutation for treatment of targeted therapy.  With his renal  insufficiency I recommended for him to continue this current treatment with the reduced dose carboplatin  and Keytruda  but we will hold his treatment with Alimta  for this cycle because of the renal insufficiency.  He will need to be monitored closely for the suspicious new mediastinal lymph node.  I discussed with the patient other treatment options including palliative care and hospice referral with median overall survival of 3-6 months.  The patient understands that the current treatment is not curative and his median survival could be up to 2 years if the treatment is effective. I also discussed with the patient switching treatment to a different regiment like docetaxel and ramucirumab but with his general condition I doubt the patient will be able to tolerate this treatment at least at this point.  After the lengthy discussion the patient and his family would like to continue with the current treatment for now.  In the meantime we will refer him for a second opinion at [facility] with Dr. Sinchcomb to see if the patient is a candidate for any clinical trial or other treatment options. He will come back for follow-up visit in 3 weeks for evaluation before the next cycle of his treatment.  If his kidney function improves by the next visit, we will consider resuming his treatment with reduced dose Alimta  in addition to Keytruda . The patient and his family had a lot of questions and we answered them completely to their satisfaction. The patient was advised to call immediately if he has any other concerning symptoms in the interval. Disclaimer: This note was dictated with voice recognition software. Similar  sounding words can inadvertently be transcribed and may be missed upon review. Sherrod MARLA Sherrod, MD

## 2024-08-14 MED FILL — Fosaprepitant Dimeglumine For IV Infusion 150 MG (Base Eq): INTRAVENOUS | Qty: 5 | Status: AC

## 2024-08-15 ENCOUNTER — Inpatient Hospital Stay

## 2024-08-15 ENCOUNTER — Other Ambulatory Visit: Payer: Self-pay | Admitting: Physician Assistant

## 2024-08-15 ENCOUNTER — Inpatient Hospital Stay: Admitting: Physician Assistant

## 2024-08-15 ENCOUNTER — Inpatient Hospital Stay: Admitting: Internal Medicine

## 2024-08-15 VITALS — HR 96

## 2024-08-15 VITALS — BP 134/81 | HR 103 | Temp 97.7°F | Resp 18 | Wt 171.1 lb

## 2024-08-15 DIAGNOSIS — Z5111 Encounter for antineoplastic chemotherapy: Secondary | ICD-10-CM | POA: Diagnosis not present

## 2024-08-15 DIAGNOSIS — Z5112 Encounter for antineoplastic immunotherapy: Secondary | ICD-10-CM

## 2024-08-15 DIAGNOSIS — C3432 Malignant neoplasm of lower lobe, left bronchus or lung: Secondary | ICD-10-CM

## 2024-08-15 LAB — CMP (CANCER CENTER ONLY)
ALT: 17 U/L (ref 0–44)
AST: 24 U/L (ref 15–41)
Albumin: 4.1 g/dL (ref 3.5–5.0)
Alkaline Phosphatase: 110 U/L (ref 38–126)
Anion gap: 12 (ref 5–15)
BUN: 28 mg/dL — ABNORMAL HIGH (ref 8–23)
CO2: 26 mmol/L (ref 22–32)
Calcium: 9.4 mg/dL (ref 8.9–10.3)
Chloride: 97 mmol/L — ABNORMAL LOW (ref 98–111)
Creatinine: 1.68 mg/dL — ABNORMAL HIGH (ref 0.61–1.24)
GFR, Estimated: 43 mL/min — ABNORMAL LOW (ref >60)
Glucose, Bld: 201 mg/dL — ABNORMAL HIGH (ref 70–99)
Potassium: 4.1 mmol/L (ref 3.5–5.1)
Sodium: 135 mmol/L (ref 135–145)
Total Bilirubin: 0.2 mg/dL (ref 0.0–1.2)
Total Protein: 7.7 g/dL (ref 6.5–8.1)

## 2024-08-15 LAB — CBC WITH DIFFERENTIAL (CANCER CENTER ONLY)
Abs Immature Granulocytes: 0.03 K/uL (ref 0.00–0.07)
Basophils Absolute: 0.1 K/uL (ref 0.0–0.1)
Basophils Relative: 1 %
Eosinophils Absolute: 0.2 K/uL (ref 0.0–0.5)
Eosinophils Relative: 2 %
HCT: 28.1 % — ABNORMAL LOW (ref 39.0–52.0)
Hemoglobin: 10 g/dL — ABNORMAL LOW (ref 13.0–17.0)
Immature Granulocytes: 0 %
Lymphocytes Relative: 18 %
Lymphs Abs: 1.7 K/uL (ref 0.7–4.0)
MCH: 37.3 pg — ABNORMAL HIGH (ref 26.0–34.0)
MCHC: 35.6 g/dL (ref 30.0–36.0)
MCV: 104.9 fL — ABNORMAL HIGH (ref 80.0–100.0)
Monocytes Absolute: 1 K/uL (ref 0.1–1.0)
Monocytes Relative: 10 %
Neutro Abs: 6.4 K/uL (ref 1.7–7.7)
Neutrophils Relative %: 69 %
Platelet Count: 613 K/uL — ABNORMAL HIGH (ref 150–400)
RBC: 2.68 MIL/uL — ABNORMAL LOW (ref 4.22–5.81)
RDW: 15.6 % — ABNORMAL HIGH (ref 11.5–15.5)
WBC Count: 9.4 K/uL (ref 4.0–10.5)
nRBC: 0 % (ref 0.0–0.2)

## 2024-08-15 MED ORDER — SODIUM CHLORIDE 0.9 % IV SOLN
200.0000 mg | Freq: Once | INTRAVENOUS | Status: AC
  Administered 2024-08-15: 16:00:00 200 mg via INTRAVENOUS
  Filled 2024-08-15: qty 200

## 2024-08-15 MED ORDER — SODIUM CHLORIDE 0.9 % IV SOLN: INTRAVENOUS | Status: DC

## 2024-08-15 MED ORDER — PALONOSETRON HCL INJECTION 0.25 MG/5ML
0.2500 mg | Freq: Once | INTRAVENOUS | Status: AC
  Administered 2024-08-15: 16:00:00 0.25 mg via INTRAVENOUS
  Filled 2024-08-15: qty 5

## 2024-08-15 MED ORDER — DEXAMETHASONE SOD PHOSPHATE PF 10 MG/ML IJ SOLN
10.0000 mg | Freq: Once | INTRAMUSCULAR | Status: AC
  Administered 2024-08-15: 16:00:00 10 mg via INTRAVENOUS

## 2024-08-15 MED ORDER — SODIUM CHLORIDE 0.9 % IV SOLN
150.0000 mg | Freq: Once | INTRAVENOUS | Status: AC
  Administered 2024-08-15: 16:00:00 150 mg via INTRAVENOUS
  Filled 2024-08-15: qty 150

## 2024-08-15 MED ORDER — SODIUM CHLORIDE 0.9 % IV SOLN
300.8000 mg | Freq: Once | INTRAVENOUS | Status: AC
  Administered 2024-08-15: 17:00:00 300 mg via INTRAVENOUS
  Filled 2024-08-15: qty 30

## 2024-08-15 NOTE — Progress Notes (Signed)
 No GCSF or Alimta  today per [name] H, PA-C.  Kaylyn Garrow, PharmD, MBA

## 2024-08-15 NOTE — Patient Instructions (Signed)
 [facility] - A DEPT OF [facility]  Discharge Instructions: Thank you for choosing [facility] to provide your oncology and hematology care.   If you have a lab appointment with the Cancer Center, please go directly to the Cancer Center and check in at the registration area.   Wear comfortable clothing and clothing appropriate for easy access to any Portacath or PICC line.   We strive to give you quality time with your provider. You may need to reschedule your appointment if you arrive late (15 or more minutes).  Arriving late affects you and other patients whose appointments are after yours.  Also, if you miss three or more appointments without notifying the office, you may be dismissed from the clinic at the providers discretion.      For prescription refill requests, have your pharmacy contact our office and allow 72 hours for refills to be completed.    Today you received the following chemotherapy and/or immunotherapy agents: Keytruda , Carboplatin       To help prevent nausea and vomiting after your treatment, we encourage you to take your nausea medication as directed.  BELOW ARE SYMPTOMS THAT SHOULD BE REPORTED IMMEDIATELY: *FEVER GREATER THAN 100.4 F (38 C) OR HIGHER *CHILLS OR SWEATING *NAUSEA AND VOMITING THAT IS NOT CONTROLLED WITH YOUR NAUSEA MEDICATION *UNUSUAL SHORTNESS OF BREATH *UNUSUAL BRUISING OR BLEEDING *URINARY PROBLEMS (pain or burning when urinating, or frequent urination) *BOWEL PROBLEMS (unusual diarrhea, constipation, pain near the anus) TENDERNESS IN MOUTH AND THROAT WITH OR WITHOUT PRESENCE OF ULCERS (sore throat, sores in mouth, or a toothache) UNUSUAL RASH, SWELLING OR PAIN  UNUSUAL VAGINAL DISCHARGE OR ITCHING   Items with * indicate a potential emergency and should be followed up as soon as possible or go to the Emergency Department if any problems should occur.  Please show the CHEMOTHERAPY ALERT CARD or  IMMUNOTHERAPY ALERT CARD at check-in to the Emergency Department and triage nurse.  Should you have questions after your visit or need to cancel or reschedule your appointment, please contact [facility] - A DEPT OF [facility]  Dept: [phone]  and follow the prompts.  Office hours are 8:00 a.m. to 4:30 p.m. Monday - Friday. Please note that voicemails left after 4:00 p.m. may not be returned until the following business day.  We are closed weekends and major holidays. You have access to a nurse at all times for urgent questions. Please call the main number to the clinic Dept: [phone] and follow the prompts.   For any non-urgent questions, you may also contact your provider using MyChart. We now offer e-Visits for anyone 45 and older to request care online for non-urgent symptoms. For details visit [email or web address].   Also download the MyChart app! Go to the app store, search MyChart, open the app, select [facility], and log in with your MyChart username and password.

## 2024-08-16 ENCOUNTER — Telehealth: Payer: Self-pay

## 2024-08-16 ENCOUNTER — Encounter: Payer: Self-pay | Admitting: Internal Medicine

## 2024-08-16 NOTE — Telephone Encounter (Signed)
 Faxed new patient referral to [facility] with confirmation at [phone].

## 2024-08-20 ENCOUNTER — Other Ambulatory Visit: Payer: Self-pay | Admitting: Physician Assistant

## 2024-08-20 ENCOUNTER — Inpatient Hospital Stay

## 2024-08-20 ENCOUNTER — Other Ambulatory Visit: Payer: Self-pay | Admitting: Medical Oncology

## 2024-08-20 DIAGNOSIS — C3432 Malignant neoplasm of lower lobe, left bronchus or lung: Secondary | ICD-10-CM

## 2024-08-20 DIAGNOSIS — Z5111 Encounter for antineoplastic chemotherapy: Secondary | ICD-10-CM | POA: Diagnosis not present

## 2024-08-20 LAB — CBC WITH DIFFERENTIAL (CANCER CENTER ONLY)
Abs Immature Granulocytes: 0.03 K/uL (ref 0.00–0.07)
Basophils Absolute: 0 K/uL (ref 0.0–0.1)
Basophils Relative: 0 %
Eosinophils Absolute: 0 K/uL (ref 0.0–0.5)
Eosinophils Relative: 0 %
HCT: 27.3 % — ABNORMAL LOW (ref 39.0–52.0)
Hemoglobin: 9.7 g/dL — ABNORMAL LOW (ref 13.0–17.0)
Immature Granulocytes: 0 %
Lymphocytes Relative: 5 %
Lymphs Abs: 0.5 K/uL — ABNORMAL LOW (ref 0.7–4.0)
MCH: 37.5 pg — ABNORMAL HIGH (ref 26.0–34.0)
MCHC: 35.5 g/dL (ref 30.0–36.0)
MCV: 105.4 fL — ABNORMAL HIGH (ref 80.0–100.0)
Monocytes Absolute: 0.1 K/uL (ref 0.1–1.0)
Monocytes Relative: 1 %
Neutro Abs: 9.4 K/uL — ABNORMAL HIGH (ref 1.7–7.7)
Neutrophils Relative %: 94 %
Platelet Count: 443 K/uL — ABNORMAL HIGH (ref 150–400)
RBC: 2.59 MIL/uL — ABNORMAL LOW (ref 4.22–5.81)
RDW: 15.3 % (ref 11.5–15.5)
WBC Count: 10.1 K/uL (ref 4.0–10.5)
nRBC: 0 % (ref 0.0–0.2)

## 2024-08-20 LAB — CMP (CANCER CENTER ONLY)
ALT: 17 U/L (ref 0–44)
AST: 27 U/L (ref 15–41)
Albumin: 4 g/dL (ref 3.5–5.0)
Alkaline Phosphatase: 96 U/L (ref 38–126)
Anion gap: 11 (ref 5–15)
BUN: 25 mg/dL — ABNORMAL HIGH (ref 8–23)
CO2: 24 mmol/L (ref 22–32)
Calcium: 9.1 mg/dL (ref 8.9–10.3)
Chloride: 97 mmol/L — ABNORMAL LOW (ref 98–111)
Creatinine: 1.36 mg/dL — ABNORMAL HIGH (ref 0.61–1.24)
GFR, Estimated: 56 mL/min — ABNORMAL LOW
Glucose, Bld: 191 mg/dL — ABNORMAL HIGH (ref 70–99)
Potassium: 4.4 mmol/L (ref 3.5–5.1)
Sodium: 132 mmol/L — ABNORMAL LOW (ref 135–145)
Total Bilirubin: 0.3 mg/dL (ref 0.0–1.2)
Total Protein: 7.5 g/dL (ref 6.5–8.1)

## 2024-08-26 ENCOUNTER — Telehealth: Payer: Self-pay

## 2024-08-26 NOTE — Telephone Encounter (Signed)
 Pt called to ask if it is ok for him to go on a cruise after his next treatment.  Per Dr Sherrod pt was notified that Dr Sherrod would not recommend based on risk of infection but if pt wants to postpone next treatment then Dr Sherrod would reconsider  Pt will discuss with Dr Sherrod at his next visit.SABRA

## 2024-08-27 ENCOUNTER — Inpatient Hospital Stay

## 2024-08-27 DIAGNOSIS — Z5111 Encounter for antineoplastic chemotherapy: Secondary | ICD-10-CM | POA: Diagnosis not present

## 2024-08-27 DIAGNOSIS — C3432 Malignant neoplasm of lower lobe, left bronchus or lung: Secondary | ICD-10-CM

## 2024-08-27 LAB — CBC WITH DIFFERENTIAL (CANCER CENTER ONLY)
Abs Immature Granulocytes: 0.02 K/uL (ref 0.00–0.07)
Basophils Absolute: 0.1 K/uL (ref 0.0–0.1)
Basophils Relative: 1 %
Eosinophils Absolute: 0.1 K/uL (ref 0.0–0.5)
Eosinophils Relative: 2 %
HCT: 29.3 % — ABNORMAL LOW (ref 39.0–52.0)
Hemoglobin: 10.3 g/dL — ABNORMAL LOW (ref 13.0–17.0)
Immature Granulocytes: 0 %
Lymphocytes Relative: 24 %
Lymphs Abs: 1.9 K/uL (ref 0.7–4.0)
MCH: 37.7 pg — ABNORMAL HIGH (ref 26.0–34.0)
MCHC: 35.2 g/dL (ref 30.0–36.0)
MCV: 107.3 fL — ABNORMAL HIGH (ref 80.0–100.0)
Monocytes Absolute: 0.6 K/uL (ref 0.1–1.0)
Monocytes Relative: 8 %
Neutro Abs: 5 K/uL (ref 1.7–7.7)
Neutrophils Relative %: 65 %
Platelet Count: 251 K/uL (ref 150–400)
RBC: 2.73 MIL/uL — ABNORMAL LOW (ref 4.22–5.81)
RDW: 16.3 % — ABNORMAL HIGH (ref 11.5–15.5)
WBC Count: 7.8 K/uL (ref 4.0–10.5)
nRBC: 0 % (ref 0.0–0.2)

## 2024-08-27 LAB — CMP (CANCER CENTER ONLY)
ALT: 18 U/L (ref 0–44)
AST: 27 U/L (ref 15–41)
Albumin: 4 g/dL (ref 3.5–5.0)
Alkaline Phosphatase: 93 U/L (ref 38–126)
Anion gap: 10 (ref 5–15)
BUN: 22 mg/dL (ref 8–23)
CO2: 27 mmol/L (ref 22–32)
Calcium: 9.4 mg/dL (ref 8.9–10.3)
Chloride: 99 mmol/L (ref 98–111)
Creatinine: 1.36 mg/dL — ABNORMAL HIGH (ref 0.61–1.24)
GFR, Estimated: 56 mL/min — ABNORMAL LOW
Glucose, Bld: 133 mg/dL — ABNORMAL HIGH (ref 70–99)
Potassium: 4 mmol/L (ref 3.5–5.1)
Sodium: 136 mmol/L (ref 135–145)
Total Bilirubin: 0.2 mg/dL (ref 0.0–1.2)
Total Protein: 7.4 g/dL (ref 6.5–8.1)

## 2024-09-02 ENCOUNTER — Ambulatory Visit (HOSPITAL_COMMUNITY)
Admission: RE | Admit: 2024-09-02 | Discharge: 2024-09-02 | Disposition: A | Discharge status: Home / Self Care | Source: Ambulatory Visit | Attending: Physician Assistant | Admitting: Physician Assistant

## 2024-09-02 DIAGNOSIS — C3432 Malignant neoplasm of lower lobe, left bronchus or lung: Secondary | ICD-10-CM | POA: Insufficient documentation

## 2024-09-02 MED ORDER — GADOBUTROL 1 MMOL/ML IV SOLN
7.5000 mL | Freq: Once | INTRAVENOUS | Status: AC | PRN
  Administered 2024-09-02: 7.5 mL via INTRAVENOUS

## 2024-09-05 ENCOUNTER — Inpatient Hospital Stay

## 2024-09-05 ENCOUNTER — Inpatient Hospital Stay (HOSPITAL_BASED_OUTPATIENT_CLINIC_OR_DEPARTMENT_OTHER): Admitting: Internal Medicine

## 2024-09-05 ENCOUNTER — Inpatient Hospital Stay: Attending: Internal Medicine

## 2024-09-05 VITALS — BP 121/74 | HR 76 | Temp 97.9°F | Resp 18

## 2024-09-05 VITALS — BP 141/73 | HR 91 | Temp 98.0°F | Resp 17 | Ht 71.0 in | Wt 173.7 lb

## 2024-09-05 DIAGNOSIS — Z85528 Personal history of other malignant neoplasm of kidney: Secondary | ICD-10-CM | POA: Diagnosis not present

## 2024-09-05 DIAGNOSIS — D6481 Anemia due to antineoplastic chemotherapy: Secondary | ICD-10-CM | POA: Insufficient documentation

## 2024-09-05 DIAGNOSIS — C3432 Malignant neoplasm of lower lobe, left bronchus or lung: Secondary | ICD-10-CM

## 2024-09-05 DIAGNOSIS — Z7982 Long term (current) use of aspirin: Secondary | ICD-10-CM | POA: Insufficient documentation

## 2024-09-05 DIAGNOSIS — Z905 Acquired absence of kidney: Secondary | ICD-10-CM | POA: Insufficient documentation

## 2024-09-05 DIAGNOSIS — T451X5A Adverse effect of antineoplastic and immunosuppressive drugs, initial encounter: Secondary | ICD-10-CM | POA: Insufficient documentation

## 2024-09-05 DIAGNOSIS — Z79899 Other long term (current) drug therapy: Secondary | ICD-10-CM | POA: Diagnosis not present

## 2024-09-05 DIAGNOSIS — I129 Hypertensive chronic kidney disease with stage 1 through stage 4 chronic kidney disease, or unspecified chronic kidney disease: Secondary | ICD-10-CM | POA: Diagnosis not present

## 2024-09-05 DIAGNOSIS — Z8673 Personal history of transient ischemic attack (TIA), and cerebral infarction without residual deficits: Secondary | ICD-10-CM | POA: Diagnosis not present

## 2024-09-05 DIAGNOSIS — N189 Chronic kidney disease, unspecified: Secondary | ICD-10-CM | POA: Diagnosis not present

## 2024-09-05 DIAGNOSIS — Z5111 Encounter for antineoplastic chemotherapy: Secondary | ICD-10-CM | POA: Insufficient documentation

## 2024-09-05 LAB — CMP (CANCER CENTER ONLY)
ALT: 17 U/L (ref 0–44)
AST: 26 U/L (ref 15–41)
Albumin: 4 g/dL (ref 3.5–5.0)
Alkaline Phosphatase: 92 U/L (ref 38–126)
Anion gap: 10 (ref 5–15)
BUN: 21 mg/dL (ref 8–23)
CO2: 26 mmol/L (ref 22–32)
Calcium: 9.3 mg/dL (ref 8.9–10.3)
Chloride: 98 mmol/L (ref 98–111)
Creatinine: 1.48 mg/dL — ABNORMAL HIGH (ref 0.61–1.24)
GFR, Estimated: 50 mL/min — ABNORMAL LOW
Glucose, Bld: 177 mg/dL — ABNORMAL HIGH (ref 70–99)
Potassium: 4.2 mmol/L (ref 3.5–5.1)
Sodium: 134 mmol/L — ABNORMAL LOW (ref 135–145)
Total Bilirubin: 0.3 mg/dL (ref 0.0–1.2)
Total Protein: 7.5 g/dL (ref 6.5–8.1)

## 2024-09-05 LAB — CBC WITH DIFFERENTIAL (CANCER CENTER ONLY)
Abs Immature Granulocytes: 0.01 K/uL (ref 0.00–0.07)
Basophils Absolute: 0 K/uL (ref 0.0–0.1)
Basophils Relative: 0 %
Eosinophils Absolute: 0.2 K/uL (ref 0.0–0.5)
Eosinophils Relative: 2 %
HCT: 29.8 % — ABNORMAL LOW (ref 39.0–52.0)
Hemoglobin: 10.7 g/dL — ABNORMAL LOW (ref 13.0–17.0)
Immature Granulocytes: 0 %
Lymphocytes Relative: 27 %
Lymphs Abs: 1.8 K/uL (ref 0.7–4.0)
MCH: 38.5 pg — ABNORMAL HIGH (ref 26.0–34.0)
MCHC: 35.9 g/dL (ref 30.0–36.0)
MCV: 107.2 fL — ABNORMAL HIGH (ref 80.0–100.0)
Monocytes Absolute: 0.4 K/uL (ref 0.1–1.0)
Monocytes Relative: 7 %
Neutro Abs: 4.2 K/uL (ref 1.7–7.7)
Neutrophils Relative %: 64 %
Platelet Count: 232 K/uL (ref 150–400)
RBC: 2.78 MIL/uL — ABNORMAL LOW (ref 4.22–5.81)
RDW: 15.9 % — ABNORMAL HIGH (ref 11.5–15.5)
WBC Count: 6.6 K/uL (ref 4.0–10.5)
nRBC: 0 % (ref 0.0–0.2)

## 2024-09-05 MED ORDER — SODIUM CHLORIDE 0.9 % IV SOLN
200.0000 mg | Freq: Once | INTRAVENOUS | Status: AC
  Administered 2024-09-05: 200 mg via INTRAVENOUS
  Filled 2024-09-05: qty 200

## 2024-09-05 MED ORDER — SODIUM CHLORIDE 0.9% FLUSH: 10.0000 mL | INTRAVENOUS | Status: DC | PRN

## 2024-09-05 MED ORDER — SODIUM CHLORIDE 0.9 % IV SOLN: Freq: Once | INTRAVENOUS | Status: AC

## 2024-09-05 MED ORDER — SODIUM CHLORIDE 0.9 % IV SOLN
375.0000 mg/m2 | Freq: Once | INTRAVENOUS | Status: AC
  Administered 2024-09-05: 800 mg via INTRAVENOUS
  Filled 2024-09-05: qty 20

## 2024-09-05 MED ORDER — SODIUM CHLORIDE 0.9 % IV SOLN: INTRAVENOUS | Status: DC

## 2024-09-05 MED ORDER — PROCHLORPERAZINE MALEATE 10 MG PO TABS
10.0000 mg | ORAL_TABLET | Freq: Once | ORAL | Status: AC
  Administered 2024-09-05: 10 mg via ORAL
  Filled 2024-09-05: qty 1

## 2024-09-05 NOTE — Progress Notes (Signed)
 "     [facility] [phone]) [phone]   [phone]  OFFICE PROGRESS NOTE  [name], MD [address] [place] [address]  DIAGNOSIS:  1) multifocal adenocarcinoma presented with hypermetabolic left lower lobe lung nodule suspicious for disease recurrence diagnosed in January 2025. 2) Stage IA (T1b, N0, M0) non-small cell lung cancer, adenocarcinoma.  The patient presented with a left lower lobe lung nodule.  She was diagnosed in February 2023.   Biomarker Findings Microsatellite status - Cannot Be Determined ? Tumor Mutational Burden - Cannot Be Determined Genomic Findings For a complete list of the genes assayed, please refer to the Appendix. KEAP1 W252C KRAS G12V STK11 E33* MUTYH G382D 7 Disease relevant genes with no reportable alterations: ALK, BRAF, EGFR, ERBB2, MET, RET, ROS1  PDL1 Expression: 0%  PRIOR THERAPY:  1) SBRT to the left lower lobe lung nodule under the care of Dr. Dewey completed on November 25, 2021 2) SBRT to the lingular nodule under the care of Dr. Dewey in February 2025  CURRENT THERAPY: Palliative systemic chemoimmunotherapy with carboplatin  for AUC of 5, Alimta  500 mg/M2 and Keytruda  200 mg IV every 3 weeks status post 4 cycles.  Carboplatin  was reduced to AUC of 4 and Alimta  was held during cycle #4.  Starting from cycle #5 the patient will be on maintenance treatment with reduced dose pemetrexed  375 mg/M2 and Keytruda  200 mg IV every 3 weeks.  INTERVAL HISTORY: Chris Holloway 74 y.o. male returns to the clinic today for follow-up visit accompanied by his girlfriend. Discussed the use of AI scribe software for clinical note transcription with the patient, who gave verbal consent to proceed.  History of Present Illness Chris Holloway is a 74 year old male with multifocal lung adenocarcinoma, chronic kidney disease, and chemotherapy-induced anemia who presents for evaluation prior to cycle 5 of  maintenance therapy.  He was diagnosed with multifocal lung adenocarcinoma in January 2025, with molecular testing negative for actionable mutations and PD-L1 expression. He completed four cycles of induction therapy with carboplatin , pemetrexed , and pembrolizumab . During cycle 4, pemetrexed  was omitted due to worsening renal function, and he received carboplatin  and pembrolizumab  only, which he tolerated with less fatigue than prior cycles. He is now scheduled to begin maintenance therapy with reduced-dose pemetrexed  and pembrolizumab , with carboplatin  discontinued. He has no new or worsening respiratory symptoms, and recent brain imaging was unremarkable.  He has chronic kidney disease and a solitary kidney, requiring close monitoring of renal function during chemotherapy. His creatinine is currently 1.48 mg/dL (clearance 50 mL/min), with recent improvement noted by his urologist. The chemotherapy regimen has been adjusted accordingly, and he is aware of the need for ongoing surveillance. He reports no new genitourinary symptoms.  He continues to experience significant gastrointestinal symptoms, specifically severe flatulence, which he manages with simethicone and alpha-galactosidase as recommended. He avoids carbonated beverages and limits beans in his diet. He takes a daily probiotic and uses polyethylene glycol in the morning with coffee to address constipation, which he attributes to iron supplementation. He continues oral iron every other day and has started vitamin C to aid absorption. His hemoglobin has improved to 10.7 g/dL from previous values in the 9 g/dL range, and he has not experienced a significant decline in blood counts since the last cycle.  He and his girlfriend discussed travel plans, specifically a cruise, and sought guidance regarding timing and safety in the context of ongoing therapy and immunosuppression. No  other acute complaints or new issues were raised during the  visit.    MEDICAL HISTORY: Past Medical History:  Diagnosis Date   Arthritis    hands back,    Chronic kidney disease    Per Dr. Arloa but not with Dr. Alvaro   Hypertension    lung ca 2023   Renal cancer (HCC) 2021   Stroke (HCC) 1998   mild stroke like symptoms lasted 5 min.    ALLERGIES:  has no known allergies.  MEDICATIONS:  Current Outpatient Medications  Medication Sig Dispense Refill   ALPRAZolam  (XANAX ) 1 MG tablet Take 1.5 mg by mouth at bedtime.     Ascorbic Acid (VITAMIN C) 1000 MG tablet Take 1,000 mg by mouth daily. (Patient not taking: Reported on 06/03/2024)     aspirin EC 81 MG tablet Take 81 mg by mouth at bedtime. Swallow whole.     azithromycin  (ZITHROMAX ) 250 MG tablet Use as instructed 6 each 0   benzonatate  (TESSALON ) 100 MG capsule Take 1 capsule (100 mg total) by mouth 3 (three) times daily as needed. 30 capsule 2   buPROPion (WELLBUTRIN) 75 MG tablet Take 10 mg by mouth 2 (two) times daily.     cetirizine (ZYRTEC) 10 MG tablet Take 10 mg by mouth daily.     chlorthalidone (HYGROTON) 25 MG tablet Take 25 mg by mouth at bedtime.     folic acid  (FOLVITE ) 1 MG tablet Take 1 tablet (1 mg total) by mouth daily. Start 7 days before pemetrexed  chemotherapy. Continue until 21 days after pemetrexed  completed. 100 tablet 3   guaifenesin (HUMIBID E) 400 MG TABS tablet Take 400 mg by mouth in the morning and at bedtime.     ondansetron  (ZOFRAN ) 8 MG tablet Take 1 tablet (8 mg total) by mouth every 8 (eight) hours as needed for nausea or vomiting. Start on the third day after carboplatin . 30 tablet 1   Polyvinyl Alcohol-Povidone (CLEAR EYES ALL SEASONS) 5-6 MG/ML SOLN Place 1 drop into both eyes daily as needed (Dry eyes).     pravastatin (PRAVACHOL) 40 MG tablet Take 40 mg by mouth at bedtime.     prochlorperazine  (COMPAZINE ) 10 MG tablet Take 1 tablet (10 mg total) by mouth every 6 (six) hours as needed for nausea or vomiting. 30 tablet 1   sildenafil (VIAGRA) 100 MG  tablet Take 100 mg by mouth daily as needed for erectile dysfunction.     valACYclovir (VALTREX) 500 MG tablet Take 500 mg by mouth daily.     No current facility-administered medications for this visit.    SURGICAL HISTORY:  Past Surgical History:  Procedure Laterality Date   BRONCHIAL BIOPSY  10/19/2021   Procedure: BRONCHIAL BIOPSIES;  Surgeon: Brenna Adine CROME, DO;  Location: [facility];  Service: Pulmonary;;   BRONCHIAL NEEDLE ASPIRATION BIOPSY  10/19/2021   Procedure: BRONCHIAL NEEDLE ASPIRATION BIOPSIES;  Surgeon: Brenna Adine CROME, DO;  Location: [facility];  Service: Pulmonary;;   FIDUCIAL MARKER PLACEMENT  10/19/2021   Procedure: FIDUCIAL MARKER PLACEMENT;  Surgeon: Brenna Adine CROME, DO;  Location: [facility];  Service: Pulmonary;;   HAND LIGAMENT RECONSTRUCTION Right 1990s   Saw injury   IR IMAGING GUIDED PORT INSERTION  06/18/2024   ROBOTIC ASSITED PARTIAL NEPHRECTOMY Left 07/15/2020   Procedure: XI ROBOTIC ASSITED RADICAL NEPHRECTOMY WITH INTRAOPERAATIVE ULTRASOUND;  Surgeon: Alvaro Hummer, MD;  Location: [facility];  Service: Urology;  Laterality: Left;  3 HRS   SHOULDER ARTHROSCOPY W/ ROTATOR CUFF REPAIR Left  08/2018   VIDEO BRONCHOSCOPY WITH RADIAL ENDOBRONCHIAL ULTRASOUND  10/19/2021   Procedure: RADIAL ENDOBRONCHIAL ULTRASOUND;  Surgeon: Brenna Adine CROME, DO;  Location: [facility];  Service: Pulmonary;;    REVIEW OF SYSTEMS:  Constitutional: positive for fatigue Eyes: negative Ears, nose, mouth, throat, and face: negative Respiratory: negative Cardiovascular: negative Gastrointestinal: negative Genitourinary:negative Integument/breast: negative Hematologic/lymphatic: negative Musculoskeletal:negative Neurological: negative Behavioral/Psych: negative Endocrine: negative Allergic/Immunologic: negative   PHYSICAL EXAMINATION: General appearance: alert, cooperative, fatigued, and no distress Head: Normocephalic, without obvious abnormality, atraumatic Neck: no  adenopathy, no JVD, supple, symmetrical, trachea midline, and thyroid  not enlarged, symmetric, no tenderness/mass/nodules Lymph nodes: Cervical, supraclavicular, and axillary nodes normal. Resp: clear to auscultation bilaterally Back: symmetric, no curvature. ROM normal. No CVA tenderness. Cardio: regular rate and rhythm, S1, S2 normal, no murmur, click, rub or gallop GI: soft, non-tender; bowel sounds normal; no masses,  no organomegaly Extremities: extremities normal, atraumatic, no cyanosis or edema Neurologic: Alert and oriented X 3, normal strength and tone. Normal symmetric reflexes. Normal coordination and gait  ECOG PERFORMANCE STATUS: 1 - Symptomatic but completely ambulatory  Blood pressure (!) 141/73, pulse 91, temperature 98 F (36.7 C), temperature source Temporal, resp. rate 17, height 5' 11 (1.803 m), weight 173 lb 11.2 oz (78.8 kg), SpO2 100%.  LABORATORY DATA: Lab Results  Component Value Date   WBC 6.6 09/05/2024   HGB 10.7 (L) 09/05/2024   HCT 29.8 (L) 09/05/2024   MCV 107.2 (H) 09/05/2024   PLT 232 09/05/2024      Chemistry      Component Value Date/Time   NA 136 08/27/2024 1356   K 4.0 08/27/2024 1356   CL 99 08/27/2024 1356   CO2 27 08/27/2024 1356   BUN 22 08/27/2024 1356   CREATININE 1.36 (H) 08/27/2024 1356      Component Value Date/Time   CALCIUM 9.4 08/27/2024 1356   ALKPHOS 93 08/27/2024 1356   AST 27 08/27/2024 1356   ALT 18 08/27/2024 1356   BILITOT 0.2 08/27/2024 1356       RADIOGRAPHIC STUDIES: MR Brain W Wo Contrast Result Date: 09/05/2024 EXAM: MRI BRAIN WITH AND WITHOUT CONTRAST 09/02/2024 01:46:04 PM TECHNIQUE: Multiplanar multisequence MRI of the head/brain was performed with and without the administration of intravenous contrast. CONTRAST: 7.5 mL of Gadavist . COMPARISON: None available. CLINICAL HISTORY: Metastatic disease evaluation; Restage disease with progressive cancer. FINDINGS: BRAIN AND VENTRICLES: T2 and FLAIR hyperintensity  in the periventricular and subcortical white matter compatible with mild chronic microvascular ischemic changes. Mild parenchymal volume loss. Concordant mild prominence of the lateral ventricles. No acute infarct. No acute intracranial hemorrhage. No mass effect or midline shift. No hydrocephalus. The sella is unremarkable. Normal flow voids. No mass or abnormal enhancement. No evidence of intracranial metastatic disease. ORBITS: No acute abnormality. SINUSES: Bilateral mastoid effusions right greater than left. Mild mucosal thickening in the ethmoid sinuses and left maxillary sinus. BONES AND SOFT TISSUES: Normal bone marrow signal and enhancement. No acute soft tissue abnormality. IMPRESSION: 1. No evidence of intracranial metastatic disease. Electronically signed by: Donnice Mania MD MD 09/05/2024 01:37 PM EST RP Workstation: [id number]   CT CHEST ABDOMEN PELVIS WO CONTRAST Result Date: 08/13/2024 EXAM: CT CHEST, ABDOMEN AND PELVIS WITHOUT CONTRAST 08/06/2024 04:47:00 PM TECHNIQUE: CT of the chest, abdomen and pelvis was performed without the administration of intravenous contrast. Multiplanar reformatted images are provided for review. Automated exposure control, iterative reconstruction, and/or weight based adjustment of the mA/kV was utilized to reduce the radiation dose to as low  as reasonably achievable. COMPARISON: Chest CT 05/27/2024. PET CT 09/21/2023. CLINICAL HISTORY: Metastatic disease evaluation. Non small cell lung cancer. * Tracking Code: BO * FINDINGS: CHEST: MEDIASTINUM AND LYMPH NODES: Heart and pericardium are unremarkable. 3 vessel coronary atherosclerosis. Right internal jugular port-a-cath terminates at the cavoatrial junction. Atherosclerotic nonaneurysmal thoracic aorta. Normal caliber main pulmonary artery. The central airways are clear. Newly mildly enlarged 1.0 cm lower right paratracheal node on series 2, image 27. Newly mildly enlarged 1.0 cm right subcarinal node on image 33. No  discrete hilar adenopathy on these noncontrast images. No axillary lymphadenopathy. LUNGS AND PLEURA: Small layering left pleural effusion is mildly increased. No right pleural effusion. No pneumothorax. Mild centrilobular and paraseptal emphysema with diffuse bronchial wall thickening. Dominant solid 4.9 x 2.7 cm medial left lower lobe lung mass on series 4, image 101, previously 4.8 x 2.1 cm, minimally increased. Numerous solid subpleural pulmonary nodules scattered throughout the periphery of the left lung, not substantially changed, with representative 1.0 cm posterior left upper lobe nodule on image 77, previously 1.0 cm. Representative anterior basilar left lower lobe 1.5 cm nodule on image 135, previously 1.5 cm. A few stable scattered pulmonary nodules in the right lung up to 1.3 cm in the posterior aspect of the superior segment right lower lobe on image 67, previously 1.3 cm. No acute consolidative airspace disease. No new significant pulmonary nodules. ABDOMEN AND PELVIS: LIVER: The liver is unremarkable. GALLBLADDER AND BILE DUCTS: Gallbladder is unremarkable. No biliary ductal dilatation. SPLEEN: No acute abnormality. PANCREAS: No acute abnormality. ADRENAL GLANDS: No acute abnormality. KIDNEYS, URETERS AND BLADDER: Left nephrectomy. Normal right kidney with no right hydronephrosis, no right renal stones and no contour deforming right renal masses. Urinary bladder is unremarkable. GI AND BOWEL: Stomach demonstrates no acute abnormality. No dilated or thick walled small bowel loops. Normal appendix. Moderate diffuse colonic stool. No large bowel wall thickening or diverticulosis. REPRODUCTIVE ORGANS: No acute abnormality. PERITONEUM AND RETROPERITONEUM: No ascites. No free air. VASCULATURE: Atherosclerotic nonaneurysmal abdominal aorta. ABDOMINAL AND PELVIS LYMPH NODES: No lymphadenopathy. BONES AND SOFT TISSUES: Moderate thoracic spondylosis. Moderate lumbar spondylosis. No acute osseous abnormality. No  focal soft tissue abnormality. IMPRESSION: 1. Minimally increased medial left lower lobe lung mass, now 4.9 x 2.7 cm, with interval enlargement in short axis consistent with slight progression of primary tumor burden. 2. New mildly enlarged lower right paratracheal and right subcarinal lymph nodes, cannot exclude new nodal metastases. 3. Small layering left pleural effusion, mildly increased, suspicious for malignant effusion. 4. No new or enlarging pulmonary nodules; numerous bilateral pulmonary nodules are unchanged, consistent with stable metastatic disease burden. 5. No evidence of metastatic disease in the abdomen or pelvis . Electronically signed by: Selinda Blue MD 08/13/2024 08:11 AM EST RP Workstation: [id number]     ASSESSMENT AND PLAN: This is a very pleasant 74 years old white male diagnosed with  multifocal adenocarcinoma presented with hypermetabolic left lower lobe lung nodule suspicious for disease recurrence diagnosed in January 2025. 2)Stage IA (T1b, N0, M0) non-small cell lung cancer, adenocarcinoma.  The patient presented with a left lower lobe lung nodule.  She was diagnosed in February 2023.  Unfortunately his scan showed progressive left lung nodularities suspicious for recurrent/metastatic disease. This was followed by a PET scan and it showed a 2.0 x 0.9 cm nodule anteriorly in the lingula with maximum SUV of 2.5 and previously had SUV of 1.6 and suspicious for malignancy. He was treated with SBRT to the lingular nodule under the care  of Dr. Dewey February 2025. The patient is currently undergoing systemic chemotherapy with carboplatin  for AUC of 5, Alimta  500 mg/M2 and Keytruda  200 mg IV every 3 weeks status post 4 cycles.  His dose of carboplatin  was reduced to AUC of 4 and Alimta  was discontinued during cycle #4. Assessment and Plan Assessment & Plan Multifocal lung adenocarcinoma He has multifocal lung adenocarcinoma diagnosed in January 2025, negative for actionable mutations  and PD-L1. Disease burden is not extensive, and recent brain imaging was unremarkable. He completed four cycles of initial therapy and transitioned to maintenance therapy. The regimen is adjusted to minimize toxicity, with carboplatin  discontinued after four cycles per protocol. Alimta  is administered at a reduced dose due to renal impairment, and Keytruda  is continued. Ongoing therapy depends on tolerability and organ function, with plans to discontinue Alimta  if renal function deteriorates. Risks of chemotherapy-induced nephrotoxicity and other adverse effects were discussed, balanced against the benefit of disease control. Imaging and laboratory studies are used to monitor for progression and toxicity. - Administered Alimta  at a reduced dose and Keytruda  for cycle 5 of maintenance therapy. - Discontinued carboplatin  after four cycles per protocol. - Ordered routine laboratory studies prior to each treatment cycle to monitor toxicity and organ function. - Scheduled follow-up every three weeks for ongoing therapy and assessment. - Planned lung imaging every nine weeks to monitor disease status. - Reviewed recent brain imaging, which was normal.  Chronic kidney disease He has chronic kidney disease with a solitary kidney and borderline creatinine. Renal function is closely monitored due to risk of chemotherapy-induced nephrotoxicity, particularly with Alimta . Dose adjustments have minimized risk, and renal function has improved since the last cycle. Ongoing vigilance is required, and therapy will be modified if renal function worsens. - Reduced Alimta  dose in response to borderline creatinine. - Monitored renal function prior to each treatment cycle. - Discussed conditional plan to discontinue Alimta  and continue Keytruda  monotherapy if renal function worsens. - Provided education regarding the importance of monitoring renal function and potential risks of chemotherapy.  Anemia secondary to  chemotherapy He has chemotherapy-induced anemia, with hemoglobin improved to 10.7 g/dL from previous values in the 9 g/dL range. No significant decline in blood counts since the last cycle. Iron supplementation has been used, but constipation and gastrointestinal side effects have occurred. - Recommended iron supplementation every other day to minimize gastrointestinal side effects. - Advised use of vitamin C or orange juice to enhance iron absorption. - Encouraged use of supportive medications (Miralax, Gas-X, Beano) for gastrointestinal symptoms. - Monitored hemoglobin and other blood counts every three weeks with routine laboratory studies. The patient was advised to call immediately if he has any other concerning symptoms in the interval.  The patient voices understanding of current disease status and treatment options and is in agreement with the current care plan.  All questions were answered. The patient knows to call the clinic with any problems, questions or concerns. We can certainly see the patient much sooner if necessary.  The total time spent in the appointment was 30 minutes including review of chart and various tests results, discussions about plan of care and coordination of care plan .   Disclaimer: This note was dictated with voice recognition software. Similar sounding words can inadvertently be transcribed and may not be corrected upon review.        "

## 2024-09-05 NOTE — Patient Instructions (Signed)
 [facility] - A DEPT OF [facility]  Discharge Instructions: Thank you for choosing [facility] to provide your oncology and hematology care.   If you have a lab appointment with the Cancer Center, please go directly to the Cancer Center and check in at the registration area.   Wear comfortable clothing and clothing appropriate for easy access to any Portacath or PICC line.   We strive to give you quality time with your provider. You may need to reschedule your appointment if you arrive late (15 or more minutes).  Arriving late affects you and other patients whose appointments are after yours.  Also, if you miss three or more appointments without notifying the office, you may be dismissed from the clinic at the providers discretion.      For prescription refill requests, have your pharmacy contact our office and allow 72 hours for refills to be completed.    Today you received the following chemotherapy and/or immunotherapy agents: Keytruda  (Pembrolizumab ), Alimta (Pemetrexed )    To help prevent nausea and vomiting after your treatment, we encourage you to take your nausea medication as directed.  BELOW ARE SYMPTOMS THAT SHOULD BE REPORTED IMMEDIATELY: *FEVER GREATER THAN 100.4 F (38 C) OR HIGHER *CHILLS OR SWEATING *NAUSEA AND VOMITING THAT IS NOT CONTROLLED WITH YOUR NAUSEA MEDICATION *UNUSUAL SHORTNESS OF BREATH *UNUSUAL BRUISING OR BLEEDING *URINARY PROBLEMS (pain or burning when urinating, or frequent urination) *BOWEL PROBLEMS (unusual diarrhea, constipation, pain near the anus) TENDERNESS IN MOUTH AND THROAT WITH OR WITHOUT PRESENCE OF ULCERS (sore throat, sores in mouth, or a toothache) UNUSUAL RASH, SWELLING OR PAIN  UNUSUAL VAGINAL DISCHARGE OR ITCHING   Items with * indicate a potential emergency and should be followed up as soon as possible or go to the Emergency Department if any problems should occur.  Please show the  CHEMOTHERAPY ALERT CARD or IMMUNOTHERAPY ALERT CARD at check-in to the Emergency Department and triage nurse.  Should you have questions after your visit or need to cancel or reschedule your appointment, please contact [facility] - A DEPT OF [facility]  Dept: [phone]  and follow the prompts.  Office hours are 8:00 a.m. to 4:30 p.m. Monday - Friday. Please note that voicemails left after 4:00 p.m. may not be returned until the following business day.  We are closed weekends and major holidays. You have access to a nurse at all times for urgent questions. Please call the main number to the clinic Dept: [phone] and follow the prompts.   For any non-urgent questions, you may also contact your provider using MyChart. We now offer e-Visits for anyone 3 and older to request care online for non-urgent symptoms. For details visit [email or web address].   Also download the MyChart app! Go to the app store, search MyChart, open the app, select [place], and log in with your MyChart username and password.

## 2024-09-10 ENCOUNTER — Inpatient Hospital Stay

## 2024-09-17 ENCOUNTER — Inpatient Hospital Stay

## 2024-09-18 ENCOUNTER — Other Ambulatory Visit: Payer: Self-pay | Admitting: Family Medicine

## 2024-09-18 DIAGNOSIS — N62 Hypertrophy of breast: Secondary | ICD-10-CM

## 2024-09-23 NOTE — Progress Notes (Signed)
 Contacted pt regarding after hours call. Pt was instructed to go to ED or Urgent care d/t c/o SOB and weakness. Pt went to Urgent care and was diagnosed with Bronchitis and given an oral antibiotic. Pt states he is a little better but still feels a little weak. Pt encouraged to make sure he is drinking plenty of fluids . Pt currently has no pain or fever. Pt to call office tomorrow if his symptoms get worse.  Pt acknowledged information given and verbalized understanding.

## 2024-09-25 ENCOUNTER — Ambulatory Visit
Admission: RE | Admit: 2024-09-25 | Discharge: 2024-09-25 | Disposition: A | Source: Ambulatory Visit | Attending: Family Medicine | Admitting: Family Medicine

## 2024-09-25 DIAGNOSIS — N62 Hypertrophy of breast: Secondary | ICD-10-CM

## 2024-09-26 ENCOUNTER — Inpatient Hospital Stay

## 2024-09-26 ENCOUNTER — Other Ambulatory Visit: Payer: Self-pay

## 2024-09-26 ENCOUNTER — Telehealth: Payer: Self-pay

## 2024-09-26 ENCOUNTER — Other Ambulatory Visit: Payer: Self-pay | Admitting: Physician Assistant

## 2024-09-26 ENCOUNTER — Inpatient Hospital Stay: Admitting: Internal Medicine

## 2024-09-26 VITALS — BP 115/67 | HR 114 | Temp 97.8°F | Resp 17 | Ht 71.0 in | Wt 176.2 lb

## 2024-09-26 VITALS — BP 114/62 | HR 101 | Temp 100.9°F | Resp 30

## 2024-09-26 DIAGNOSIS — D709 Neutropenia, unspecified: Secondary | ICD-10-CM

## 2024-09-26 DIAGNOSIS — C3432 Malignant neoplasm of lower lobe, left bronchus or lung: Secondary | ICD-10-CM

## 2024-09-26 DIAGNOSIS — Z5111 Encounter for antineoplastic chemotherapy: Secondary | ICD-10-CM | POA: Diagnosis not present

## 2024-09-26 LAB — CBC WITH DIFFERENTIAL (CANCER CENTER ONLY)
Abs Immature Granulocytes: 0 10*3/uL (ref 0.00–0.07)
Basophils Absolute: 0 10*3/uL (ref 0.0–0.1)
Basophils Relative: 1 %
Eosinophils Absolute: 0 10*3/uL (ref 0.0–0.5)
Eosinophils Relative: 0 %
HCT: 28.5 % — ABNORMAL LOW (ref 39.0–52.0)
Hemoglobin: 10.2 g/dL — ABNORMAL LOW (ref 13.0–17.0)
Immature Granulocytes: 0 %
Lymphocytes Relative: 72 %
Lymphs Abs: 2.2 10*3/uL (ref 0.7–4.0)
MCH: 37.6 pg — ABNORMAL HIGH (ref 26.0–34.0)
MCHC: 35.8 g/dL (ref 30.0–36.0)
MCV: 105.2 fL — ABNORMAL HIGH (ref 80.0–100.0)
Monocytes Absolute: 0.5 10*3/uL (ref 0.1–1.0)
Monocytes Relative: 14 %
Neutro Abs: 0.4 10*3/uL — CL (ref 1.7–7.7)
Neutrophils Relative %: 13 %
Platelet Count: 127 10*3/uL — ABNORMAL LOW (ref 150–400)
RBC: 2.71 MIL/uL — ABNORMAL LOW (ref 4.22–5.81)
RDW: 14.1 % (ref 11.5–15.5)
Smear Review: NORMAL
WBC Count: 3.1 10*3/uL — ABNORMAL LOW (ref 4.0–10.5)
nRBC: 0 % (ref 0.0–0.2)

## 2024-09-26 LAB — CMP (CANCER CENTER ONLY)
ALT: 39 U/L (ref 0–44)
AST: 64 U/L — ABNORMAL HIGH (ref 15–41)
Albumin: 3.7 g/dL (ref 3.5–5.0)
Alkaline Phosphatase: 80 U/L (ref 38–126)
Anion gap: 12 (ref 5–15)
BUN: 19 mg/dL (ref 8–23)
CO2: 24 mmol/L (ref 22–32)
Calcium: 8.9 mg/dL (ref 8.9–10.3)
Chloride: 91 mmol/L — ABNORMAL LOW (ref 98–111)
Creatinine: 1.6 mg/dL — ABNORMAL HIGH (ref 0.61–1.24)
GFR, Estimated: 46 mL/min — ABNORMAL LOW
Glucose, Bld: 104 mg/dL — ABNORMAL HIGH (ref 70–99)
Potassium: 4.1 mmol/L (ref 3.5–5.1)
Sodium: 127 mmol/L — ABNORMAL LOW (ref 135–145)
Total Bilirubin: 0.3 mg/dL (ref 0.0–1.2)
Total Protein: 7.3 g/dL (ref 6.5–8.1)

## 2024-09-26 LAB — TSH: TSH: 1.7 u[IU]/mL (ref 0.350–4.500)

## 2024-09-26 MED ORDER — FILGRASTIM-SNDZ 300 MCG/0.5ML IJ SOSY
300.0000 ug | PREFILLED_SYRINGE | Freq: Once | INTRAMUSCULAR | Status: AC
  Administered 2024-09-26: 300 ug via SUBCUTANEOUS
  Filled 2024-09-26: qty 0.5

## 2024-09-26 MED ORDER — SODIUM CHLORIDE 0.9 % IV SOLN: INTRAVENOUS | Status: AC

## 2024-09-26 NOTE — Patient Instructions (Signed)
 [facility] - A DEPT OF [facility]  Discharge Instructions: Thank you for choosing [facility] to provide your oncology and hematology care.   If you have a lab appointment with the Cancer Center, please go directly to the Cancer Center and check in at the registration area.   Wear comfortable clothing and clothing appropriate for easy access to any Portacath or PICC line.   We strive to give you quality time with your provider. You may need to reschedule your appointment if you arrive late (15 or more minutes).  Arriving late affects you and other patients whose appointments are after yours.  Also, if you miss three or more appointments without notifying the office, you may be dismissed from the clinic at the providers discretion.      For prescription refill requests, have your pharmacy contact our office and allow 72 hours for refills to be completed.    Today you received the following chemotherapy and/or immunotherapy agents: NONE. Hydration and Zarxio  only.       To help prevent nausea and vomiting after your treatment, we encourage you to take your nausea medication as directed.  BELOW ARE SYMPTOMS THAT SHOULD BE REPORTED IMMEDIATELY: *FEVER GREATER THAN 100.4 F (38 C) OR HIGHER *CHILLS OR SWEATING *NAUSEA AND VOMITING THAT IS NOT CONTROLLED WITH YOUR NAUSEA MEDICATION *UNUSUAL SHORTNESS OF BREATH *UNUSUAL BRUISING OR BLEEDING *URINARY PROBLEMS (pain or burning when urinating, or frequent urination) *BOWEL PROBLEMS (unusual diarrhea, constipation, pain near the anus) TENDERNESS IN MOUTH AND THROAT WITH OR WITHOUT PRESENCE OF ULCERS (sore throat, sores in mouth, or a toothache) UNUSUAL RASH, SWELLING OR PAIN  UNUSUAL VAGINAL DISCHARGE OR ITCHING   Items with * indicate a potential emergency and should be followed up as soon as possible or go to the Emergency Department if any problems should occur.  Please show the CHEMOTHERAPY  ALERT CARD or IMMUNOTHERAPY ALERT CARD at check-in to the Emergency Department and triage nurse.  Should you have questions after your visit or need to cancel or reschedule your appointment, please contact [facility] - A DEPT OF [facility]  Dept: [phone]  and follow the prompts.  Office hours are 8:00 a.m. to 4:30 p.m. Monday - Friday. Please note that voicemails left after 4:00 p.m. may not be returned until the following business day.  We are closed weekends and major holidays. You have access to a nurse at all times for urgent questions. Please call the main number to the clinic Dept: [phone] and follow the prompts.   For any non-urgent questions, you may also contact your provider using MyChart. We now offer e-Visits for anyone 16 and older to request care online for non-urgent symptoms. For details visit [email or web address].   Also download the MyChart app! Go to the app store, search MyChart, open the app, select [facility], and log in with your MyChart username and password.

## 2024-09-26 NOTE — Progress Notes (Signed)
 Care handoff received from [facility], RN @1600 . Pt finished up liter of NaCl and received filgrastim  injection. Pt educated on what to do if symptoms worsen when at home. Pt informed of Friday and Saturday appointments and received AVS with appointment details. Pt verbalized understanding, but refused to be transferred to ED at discharge. Pt taken in wheelchair to lobby with AVS in hand.

## 2024-09-26 NOTE — Progress Notes (Signed)
 Confirmed no auth required for Zarxio  per PA team. Okay to proceed with treatment today.  Harlene Nasuti, PharmD Oncology Infusion Pharmacist 09/26/2024 2:49 PM

## 2024-09-26 NOTE — Telephone Encounter (Addendum)
 CRITICAL VALUE STICKER  CRITICAL VALUE: ANC 0.4  RECEIVER (on-site recipient of call):Uma Jerde, RN  DATE & TIME NOTIFIED: 2:25 PM 09/26/2024  MESSENGER (representative from lab):lab   MD NOTIFIED: Dr Sherrod  TIME OF NOTIFICATION:2:25 PM  RESPONSE:  order for zarxio     Notified Dr Sherrod of pts lab result and fever.  Dr Jeannett recommendation is for pt  to go to hospital due to risk for infection Informed pt.  Pt verbalized understanding but states he does not want to go. Pt states he will purchase Covid and flu tests on his way home and  will call our office if they are positive. Pt states he is aware of the risks.

## 2024-09-26 NOTE — Progress Notes (Signed)
 "     [facility] [phone]   [phone]  OFFICE PROGRESS NOTE  [name], MD [address] [place] [address]  DIAGNOSIS:  1) multifocal adenocarcinoma presented with hypermetabolic left lower lobe lung nodule suspicious for disease recurrence diagnosed in January 2025. 2) Stage IA (T1b, N0, M0) non-small cell lung cancer, adenocarcinoma.  The patient presented with a left lower lobe lung nodule.  She was diagnosed in February 2023.   Biomarker Findings Microsatellite status - Cannot Be Determined ? Tumor Mutational Burden - Cannot Be Determined Genomic Findings For a complete list of the genes assayed, please refer to the Appendix. KEAP1 W252C KRAS G12V STK11 E33* MUTYH G382D 7 Disease relevant genes with no reportable alterations: ALK, BRAF, EGFR, ERBB2, MET, RET, ROS1  PDL1 Expression: 0%  PRIOR THERAPY:  1) SBRT to the left lower lobe lung nodule under the care of Dr. Dewey completed on November 25, 2021 2) SBRT to the lingular nodule under the care of Dr. Dewey in February 2025  CURRENT THERAPY: Palliative systemic chemoimmunotherapy with carboplatin  for AUC of 5, Alimta  500 mg/M2 and Keytruda  200 mg IV every 3 weeks status post 4 cycles.  Carboplatin  was reduced to AUC of 4 and Alimta  was held during cycle #4.  Starting from cycle #5 the patient will be on maintenance treatment with reduced dose pemetrexed  375 mg/M2 and Keytruda  200 mg IV every 3 weeks.  INTERVAL HISTORY: Chris Holloway 74 y.o. male returns to the clinic today for follow-up visit. Discussed the use of AI scribe software for clinical note transcription with the patient, who gave verbal consent to proceed.  History of Present Illness Chris Holloway is a 74 year old male with multifocal lung adenocarcinoma on maintenance pemetrexed  and pembrolizumab  who presents with acute respiratory symptoms.  He was diagnosed with multifocal lung  adenocarcinoma in January 2025, characterized by absence of actionable mutations and negative PD-L1 expression. He completed SBRT to the left lower lobe and lingula, and has received five cycles of maintenance pemetrexed  and pembrolizumab  every three weeks.  Over the past several days, he developed a fever up to 99.80F while staying with his girlfriend and was evaluated at a walk-in clinic, where he was diagnosed with acute bronchitis and started on cefdinir 300 mg. He describes variable response to antibiotics, with alternating days of improvement and worsening. He has not been tested for COVID-19 or influenza, and has not performed a home test due to lack of supplies. He also saw his family physician for evaluation of possible sinus infection.  He currently endorses persistent cough, severe headache localized to the forehead, and significant bilateral leg weakness and pain, stating he can hardly walk due to the severity. He describes fluctuating symptoms, with some days feeling better and others worse. No fever was present at today's visit (temperature 97.31F). He has been taking acetaminophen  without relief. He expresses concern regarding his white blood cell count, which is mildly low (3.1). He denies chest pain, shortness of breath, and abnormal bleeding.      MEDICAL HISTORY: Past Medical History:  Diagnosis Date   Arthritis    hands back,    Chronic kidney disease    Per Dr. Arloa but not with Dr. Alvaro   Hypertension    lung ca 2023   Renal cancer (HCC) 2021   Stroke (HCC) 1998   mild stroke like symptoms lasted 5 min.    ALLERGIES:  has no known allergies.  MEDICATIONS:  Current Outpatient Medications  Medication Sig Dispense Refill   ALPRAZolam  (XANAX ) 1 MG tablet Take 1.5 mg by mouth at bedtime.     Ascorbic Acid (VITAMIN C) 1000 MG tablet Take 1,000 mg by mouth daily. (Patient not taking: Reported on 06/03/2024)     aspirin EC 81 MG tablet Take 81 mg by mouth at bedtime.  Swallow whole.     azithromycin  (ZITHROMAX ) 250 MG tablet Use as instructed 6 each 0   benzonatate  (TESSALON ) 100 MG capsule Take 1 capsule (100 mg total) by mouth 3 (three) times daily as needed. 30 capsule 2   buPROPion (WELLBUTRIN) 75 MG tablet Take 10 mg by mouth 2 (two) times daily.     cetirizine (ZYRTEC) 10 MG tablet Take 10 mg by mouth daily.     chlorthalidone (HYGROTON) 25 MG tablet Take 25 mg by mouth at bedtime.     folic acid  (FOLVITE ) 1 MG tablet Take 1 tablet (1 mg total) by mouth daily. Start 7 days before pemetrexed  chemotherapy. Continue until 21 days after pemetrexed  completed. 100 tablet 3   guaifenesin (HUMIBID E) 400 MG TABS tablet Take 400 mg by mouth in the morning and at bedtime.     ondansetron  (ZOFRAN ) 8 MG tablet Take 1 tablet (8 mg total) by mouth every 8 (eight) hours as needed for nausea or vomiting. Start on the third day after carboplatin . 30 tablet 1   Polyvinyl Alcohol-Povidone (CLEAR EYES ALL SEASONS) 5-6 MG/ML SOLN Place 1 drop into both eyes daily as needed (Dry eyes).     pravastatin (PRAVACHOL) 40 MG tablet Take 40 mg by mouth at bedtime.     prochlorperazine  (COMPAZINE ) 10 MG tablet Take 1 tablet (10 mg total) by mouth every 6 (six) hours as needed for nausea or vomiting. 30 tablet 1   sildenafil (VIAGRA) 100 MG tablet Take 100 mg by mouth daily as needed for erectile dysfunction.     valACYclovir (VALTREX) 500 MG tablet Take 500 mg by mouth daily.     No current facility-administered medications for this visit.    SURGICAL HISTORY:  Past Surgical History:  Procedure Laterality Date   BRONCHIAL BIOPSY  10/19/2021   Procedure: BRONCHIAL BIOPSIES;  Surgeon: Brenna Adine CROME, DO;  Location: [facility];  Service: Pulmonary;;   BRONCHIAL NEEDLE ASPIRATION BIOPSY  10/19/2021   Procedure: BRONCHIAL NEEDLE ASPIRATION BIOPSIES;  Surgeon: Brenna Adine CROME, DO;  Location: [facility];  Service: Pulmonary;;   FIDUCIAL MARKER PLACEMENT  10/19/2021   Procedure:  FIDUCIAL MARKER PLACEMENT;  Surgeon: Brenna Adine CROME, DO;  Location: [facility];  Service: Pulmonary;;   HAND LIGAMENT RECONSTRUCTION Right 1990s   Saw injury   IR IMAGING GUIDED PORT INSERTION  06/18/2024   ROBOTIC ASSITED PARTIAL NEPHRECTOMY Left 07/15/2020   Procedure: XI ROBOTIC ASSITED RADICAL NEPHRECTOMY WITH INTRAOPERAATIVE ULTRASOUND;  Surgeon: Alvaro Hummer, MD;  Location: [facility];  Service: Urology;  Laterality: Left;  3 HRS   SHOULDER ARTHROSCOPY W/ ROTATOR CUFF REPAIR Left 08/2018   VIDEO BRONCHOSCOPY WITH RADIAL ENDOBRONCHIAL ULTRASOUND  10/19/2021   Procedure: RADIAL ENDOBRONCHIAL ULTRASOUND;  Surgeon: Brenna Adine CROME, DO;  Location: [facility];  Service: Pulmonary;;    REVIEW OF SYSTEMS:  Constitutional: positive for fatigue and malaise Eyes: negative Ears, nose, mouth, throat, and face: negative Respiratory: positive for cough and dyspnea on exertion Cardiovascular: negative Gastrointestinal: negative Genitourinary:negative Integument/breast: negative Hematologic/lymphatic: negative Musculoskeletal:negative Neurological: negative Behavioral/Psych: negative Endocrine: negative Allergic/Immunologic: negative   PHYSICAL  EXAMINATION: General appearance: alert, cooperative, fatigued, and no distress Head: Normocephalic, without obvious abnormality, atraumatic Neck: no adenopathy, no JVD, supple, symmetrical, trachea midline, and thyroid  not enlarged, symmetric, no tenderness/mass/nodules Lymph nodes: Cervical, supraclavicular, and axillary nodes normal. Resp: clear to auscultation bilaterally Back: symmetric, no curvature. ROM normal. No CVA tenderness. Cardio: regular rate and rhythm, S1, S2 normal, no murmur, click, rub or gallop GI: soft, non-tender; bowel sounds normal; no masses,  no organomegaly Extremities: extremities normal, atraumatic, no cyanosis or edema Neurologic: Alert and oriented X 3, normal strength and tone. Normal symmetric reflexes. Normal  coordination and gait  ECOG PERFORMANCE STATUS: 1 - Symptomatic but completely ambulatory  Blood pressure 115/67, pulse (!) 114, temperature 97.8 F (36.6 C), temperature source Temporal, resp. rate 17, height 5' 11 (1.803 m), weight 176 lb 3.2 oz (79.9 kg), SpO2 99%.  LABORATORY DATA: Lab Results  Component Value Date   WBC 3.1 (L) 09/26/2024   HGB 10.2 (L) 09/26/2024   HCT 28.5 (L) 09/26/2024   MCV 105.2 (H) 09/26/2024   PLT 127 (L) 09/26/2024      Chemistry      Component Value Date/Time   NA 134 (L) 09/05/2024 1323   K 4.2 09/05/2024 1323   CL 98 09/05/2024 1323   CO2 26 09/05/2024 1323   BUN 21 09/05/2024 1323   CREATININE 1.48 (H) 09/05/2024 1323      Component Value Date/Time   CALCIUM 9.3 09/05/2024 1323   ALKPHOS 92 09/05/2024 1323   AST 26 09/05/2024 1323   ALT 17 09/05/2024 1323   BILITOT 0.3 09/05/2024 1323       RADIOGRAPHIC STUDIES: MM 3D DIAGNOSTIC MAMMOGRAM BILATERAL BREAST Result Date: 09/25/2024 CLINICAL DATA:  74 year old gentleman who presents with intermittent LEFT breast pain for 3 months. He has a personal history of LEFT lung and renal malignancy. EXAM: DIGITAL DIAGNOSTIC BILATERAL MAMMOGRAM WITH TOMOSYNTHESIS AND CAD; ULTRASOUND LEFT BREAST LIMITED TECHNIQUE: Bilateral digital diagnostic mammography and breast tomosynthesis was performed. The images were evaluated with computer-aided detection. ; Targeted ultrasound examination of the left breast was performed. COMPARISON:  None available. ACR Breast Density Category b: There are scattered areas of fibroglandular density. FINDINGS: RIGHT: Mammogram: Mild asymmetry of the retroareolar RIGHT breast is most consistent with gynecomastia. No suspicious abnormalities of the RIGHT breast identified. LEFT: Mammogram: Mild asymmetry of the retroareolar LEFT breast is consistent with gynecomastia. No suspicious abnormalities of the LEFT breast identified. Physical examination: On focused examination of the LEFT  breast, there is mild tenderness to palpation in the upper, outer, and lower LEFT breast. No significant skin changes or palpable masses identified at the sites. Ultrasound: Targeted sonographic evaluation of the patient's LEFT breast demonstrates no significant abnormality. IMPRESSION: 1. No evidence of RIGHT or LEFT breast malignancy. 2. Mild BILATERAL gynecomastia. RECOMMENDATION: Any further workup of the patient's symptoms should be based on the clinical assessment. I have discussed the findings and recommendations with the patient. If applicable, a reminder letter will be sent to the patient regarding the next appointment. BI-RADS CATEGORY  2: Benign. Electronically Signed   By: Aliene Lloyd M.D.   On: 09/25/2024 15:56   US  LIMITED ULTRASOUND INCLUDING AXILLA LEFT BREAST  Result Date: 09/25/2024 CLINICAL DATA:  74 year old gentleman who presents with intermittent LEFT breast pain for 3 months. He has a personal history of LEFT lung and renal malignancy. EXAM: DIGITAL DIAGNOSTIC BILATERAL MAMMOGRAM WITH TOMOSYNTHESIS AND CAD; ULTRASOUND LEFT BREAST LIMITED TECHNIQUE: Bilateral digital diagnostic mammography and breast tomosynthesis was  performed. The images were evaluated with computer-aided detection. ; Targeted ultrasound examination of the left breast was performed. COMPARISON:  None available. ACR Breast Density Category b: There are scattered areas of fibroglandular density. FINDINGS: RIGHT: Mammogram: Mild asymmetry of the retroareolar RIGHT breast is most consistent with gynecomastia. No suspicious abnormalities of the RIGHT breast identified. LEFT: Mammogram: Mild asymmetry of the retroareolar LEFT breast is consistent with gynecomastia. No suspicious abnormalities of the LEFT breast identified. Physical examination: On focused examination of the LEFT breast, there is mild tenderness to palpation in the upper, outer, and lower LEFT breast. No significant skin changes or palpable masses identified at  the sites. Ultrasound: Targeted sonographic evaluation of the patient's LEFT breast demonstrates no significant abnormality. IMPRESSION: 1. No evidence of RIGHT or LEFT breast malignancy. 2. Mild BILATERAL gynecomastia. RECOMMENDATION: Any further workup of the patient's symptoms should be based on the clinical assessment. I have discussed the findings and recommendations with the patient. If applicable, a reminder letter will be sent to the patient regarding the next appointment. BI-RADS CATEGORY  2: Benign. Electronically Signed   By: Aliene Lloyd M.D.   On: 09/25/2024 15:56   MR Brain W Wo Contrast Result Date: 09/05/2024 EXAM: MRI BRAIN WITH AND WITHOUT CONTRAST 09/02/2024 01:46:04 PM TECHNIQUE: Multiplanar multisequence MRI of the head/brain was performed with and without the administration of intravenous contrast. CONTRAST: 7.5 mL of Gadavist . COMPARISON: None available. CLINICAL HISTORY: Metastatic disease evaluation; Restage disease with progressive cancer. FINDINGS: BRAIN AND VENTRICLES: T2 and FLAIR hyperintensity in the periventricular and subcortical white matter compatible with mild chronic microvascular ischemic changes. Mild parenchymal volume loss. Concordant mild prominence of the lateral ventricles. No acute infarct. No acute intracranial hemorrhage. No mass effect or midline shift. No hydrocephalus. The sella is unremarkable. Normal flow voids. No mass or abnormal enhancement. No evidence of intracranial metastatic disease. ORBITS: No acute abnormality. SINUSES: Bilateral mastoid effusions right greater than left. Mild mucosal thickening in the ethmoid sinuses and left maxillary sinus. BONES AND SOFT TISSUES: Normal bone marrow signal and enhancement. No acute soft tissue abnormality. IMPRESSION: 1. No evidence of intracranial metastatic disease. Electronically signed by: Donnice Mania MD MD 09/05/2024 01:37 PM EST RP Workstation: [id number]     ASSESSMENT AND PLAN: This is a very pleasant 74  years old white male diagnosed with  multifocal adenocarcinoma presented with hypermetabolic left lower lobe lung nodule suspicious for disease recurrence diagnosed in January 2025. 2)Stage IA (T1b, N0, M0) non-small cell lung cancer, adenocarcinoma.  The patient presented with a left lower lobe lung nodule.  She was diagnosed in February 2023.  Unfortunately his scan showed progressive left lung nodularities suspicious for recurrent/metastatic disease. This was followed by a PET scan and it showed a 2.0 x 0.9 cm nodule anteriorly in the lingula with maximum SUV of 2.5 and previously had SUV of 1.6 and suspicious for malignancy. He was treated with SBRT to the lingular nodule under the care of Dr. Dewey February 2025. The patient is currently undergoing systemic chemotherapy with carboplatin  for AUC of 5, Alimta  500 mg/M2 and Keytruda  200 mg IV every 3 weeks status post 5 cycles.  His dose of carboplatin  was reduced to AUC of 4 and Alimta  was discontinued during cycle #4. Assessment and Plan Assessment & Plan Multifocal lung adenocarcinoma He is acutely unwell with cough, weakness, and possible infection, precluding administration of cycle 6. Leukopenia is present but not at a level requiring intervention. - Deferred cycle 6 of systemic therapy due  to acute illness and suspected infection. - Reschedule treatment for next week, contingent on clinical improvement.  Acute bronchitis Acute bronchitis with cough, subjective fever, malaise, and ongoing symptoms despite cefdinir. No fever on exam, but tachycardia present. Possible viral infection or persistent bacterial bronchitis, resulting in delay of cancer therapy. - Administered intravenous fluids for hydration. - Advised supportive care with hydration and acetaminophen . - Advised emergency department evaluation if clinical status deteriorates. - Held cancer therapy until clinical improvement.  Chemotherapy-induced anemia Mild anemia (hemoglobin  10.2) and leukopenia (WBC 3.1), likely secondary to systemic therapy. No acute intervention indicated as anemia is stable and not severely symptomatic. Ongoing monitoring required due to continued risk from maintenance therapy and solitary kidney. - Monitored blood counts; no intervention required at this time. - Deferred growth factor support as not indicated given current counts and clinical scenario. The patient was advised to call immediately if he has any other concerning symptoms in the interval.   The patient voices understanding of current disease status and treatment options and is in agreement with the current care plan.  All questions were answered. The patient knows to call the clinic with any problems, questions or concerns. We can certainly see the patient much sooner if necessary.  The total time spent in the appointment was 30 minutes including review of chart and various tests results, discussions about plan of care and coordination of care plan .   Disclaimer: This note was dictated with voice recognition software. Similar sounding words can inadvertently be transcribed and may not be corrected upon review.        "

## 2024-09-26 NOTE — Progress Notes (Signed)
 Talulah Schirmer RN made Dr Sherrod aware that patient running a fever of 100.9, increased work of breathing. Dr Sherrod recommended that patient go to ER for evaluation considering his neutropenic status. Lonell RN and Astronomer spoke with patient and made him aware of Dr Jeannett recommendation. Patient states he will not go to ER at this time. States he is taking medications at home to manage his symptoms. Nemesis Rainwater RN spoke with patient about his current neutropenic status and risk of deterioration due to illness and severity of symptoms. Patient continues to refuse emergency care at this time. Patient to receive dose of GCSF today, and have appointments scheduled for administration tomorrow and Saturday as well.   Care handoff to Emme P, RN @ 1600.

## 2024-09-27 ENCOUNTER — Telehealth: Payer: Self-pay | Admitting: Internal Medicine

## 2024-09-27 ENCOUNTER — Inpatient Hospital Stay

## 2024-09-27 VITALS — BP 110/91 | HR 101 | Temp 98.2°F | Resp 25

## 2024-09-27 DIAGNOSIS — Z5111 Encounter for antineoplastic chemotherapy: Secondary | ICD-10-CM | POA: Diagnosis not present

## 2024-09-27 DIAGNOSIS — D709 Neutropenia, unspecified: Secondary | ICD-10-CM

## 2024-09-27 LAB — T4: T4, Total: 10.1 ug/dL (ref 4.5–12.0)

## 2024-09-27 MED ORDER — FILGRASTIM-SNDZ 300 MCG/0.5ML IJ SOSY
300.0000 ug | PREFILLED_SYRINGE | Freq: Once | INTRAMUSCULAR | Status: AC
  Administered 2024-09-27: 300 ug via SUBCUTANEOUS
  Filled 2024-09-27: qty 0.5

## 2024-09-28 ENCOUNTER — Inpatient Hospital Stay

## 2024-09-29 ENCOUNTER — Other Ambulatory Visit: Payer: Self-pay

## 2024-09-30 ENCOUNTER — Inpatient Hospital Stay: Attending: Internal Medicine

## 2024-10-01 ENCOUNTER — Encounter: Payer: Self-pay | Admitting: Internal Medicine

## 2024-10-01 ENCOUNTER — Inpatient Hospital Stay: Attending: Internal Medicine

## 2024-10-01 ENCOUNTER — Encounter: Payer: Self-pay | Admitting: Physician Assistant

## 2024-10-01 ENCOUNTER — Inpatient Hospital Stay: Attending: Internal Medicine | Admitting: Internal Medicine

## 2024-10-01 VITALS — BP 128/74 | HR 89 | Temp 97.6°F | Resp 17 | Ht 71.0 in | Wt 171.2 lb

## 2024-10-01 DIAGNOSIS — C3432 Malignant neoplasm of lower lobe, left bronchus or lung: Secondary | ICD-10-CM | POA: Diagnosis not present

## 2024-10-01 DIAGNOSIS — D709 Neutropenia, unspecified: Secondary | ICD-10-CM

## 2024-10-01 LAB — CBC WITH DIFFERENTIAL (CANCER CENTER ONLY)
Abs Immature Granulocytes: 0.74 10*3/uL — ABNORMAL HIGH (ref 0.00–0.07)
Basophils Absolute: 0.1 10*3/uL (ref 0.0–0.1)
Basophils Relative: 1 %
Eosinophils Absolute: 0.2 10*3/uL (ref 0.0–0.5)
Eosinophils Relative: 2 %
HCT: 29.3 % — ABNORMAL LOW (ref 39.0–52.0)
Hemoglobin: 9.9 g/dL — ABNORMAL LOW (ref 13.0–17.0)
Immature Granulocytes: 7 %
Lymphocytes Relative: 29 %
Lymphs Abs: 3.1 10*3/uL (ref 0.7–4.0)
MCH: 36.5 pg — ABNORMAL HIGH (ref 26.0–34.0)
MCHC: 33.8 g/dL (ref 30.0–36.0)
MCV: 108.1 fL — ABNORMAL HIGH (ref 80.0–100.0)
Monocytes Absolute: 1.9 10*3/uL — ABNORMAL HIGH (ref 0.1–1.0)
Monocytes Relative: 18 %
Neutro Abs: 4.6 10*3/uL (ref 1.7–7.7)
Neutrophils Relative %: 43 %
Platelet Count: 379 10*3/uL (ref 150–400)
RBC: 2.71 MIL/uL — ABNORMAL LOW (ref 4.22–5.81)
RDW: 14.8 % (ref 11.5–15.5)
WBC Count: 10.6 10*3/uL — ABNORMAL HIGH (ref 4.0–10.5)
nRBC: 0 % (ref 0.0–0.2)

## 2024-10-01 LAB — CMP (CANCER CENTER ONLY)
ALT: 51 U/L — ABNORMAL HIGH (ref 0–44)
AST: 61 U/L — ABNORMAL HIGH (ref 15–41)
Albumin: 3.8 g/dL (ref 3.5–5.0)
Alkaline Phosphatase: 100 U/L (ref 38–126)
Anion gap: 11 (ref 5–15)
BUN: 19 mg/dL (ref 8–23)
CO2: 25 mmol/L (ref 22–32)
Calcium: 9.3 mg/dL (ref 8.9–10.3)
Chloride: 98 mmol/L (ref 98–111)
Creatinine: 1.78 mg/dL — ABNORMAL HIGH (ref 0.61–1.24)
GFR, Estimated: 40 mL/min — ABNORMAL LOW
Glucose, Bld: 102 mg/dL — ABNORMAL HIGH (ref 70–99)
Potassium: 4.2 mmol/L (ref 3.5–5.1)
Sodium: 135 mmol/L (ref 135–145)
Total Bilirubin: <0.2 mg/dL (ref 0.0–1.2)
Total Protein: 7.5 g/dL (ref 6.5–8.1)

## 2024-10-01 LAB — TSH: TSH: 1.85 u[IU]/mL (ref 0.350–4.500)

## 2024-10-01 MED ORDER — FILGRASTIM-SNDZ 300 MCG/0.5ML IJ SOSY
300.0000 ug | PREFILLED_SYRINGE | Freq: Once | INTRAMUSCULAR | Status: AC
  Administered 2024-10-01: 300 ug via SUBCUTANEOUS
  Filled 2024-10-01: qty 0.5

## 2024-10-02 ENCOUNTER — Inpatient Hospital Stay

## 2024-10-02 ENCOUNTER — Inpatient Hospital Stay: Admitting: Internal Medicine

## 2024-10-02 LAB — T4: T4, Total: 11.4 ug/dL (ref 4.5–12.0)

## 2024-10-03 ENCOUNTER — Other Ambulatory Visit: Payer: Self-pay | Admitting: Physician Assistant

## 2024-10-03 ENCOUNTER — Inpatient Hospital Stay

## 2024-10-03 VITALS — BP 145/73 | HR 98 | Temp 98.3°F | Resp 17

## 2024-10-03 DIAGNOSIS — C3432 Malignant neoplasm of lower lobe, left bronchus or lung: Secondary | ICD-10-CM

## 2024-10-03 DIAGNOSIS — R7989 Other specified abnormal findings of blood chemistry: Secondary | ICD-10-CM

## 2024-10-03 MED ORDER — SODIUM CHLORIDE 0.9 % IV SOLN
200.0000 mg | Freq: Once | INTRAVENOUS | Status: AC
  Administered 2024-10-03: 200 mg via INTRAVENOUS
  Filled 2024-10-03: qty 200

## 2024-10-03 MED ORDER — SODIUM CHLORIDE 0.9% FLUSH: 10.0000 mL | INTRAVENOUS | Status: DC | PRN

## 2024-10-03 MED ORDER — SODIUM CHLORIDE 0.9 % IV SOLN: INTRAVENOUS | Status: DC

## 2024-10-03 MED ORDER — SODIUM CHLORIDE 0.9 % IV SOLN: Freq: Once | INTRAVENOUS | Status: AC

## 2024-10-03 MED ORDER — CYANOCOBALAMIN 1000 MCG/ML IJ SOLN
1000.0000 ug | Freq: Once | INTRAMUSCULAR | Status: AC
  Administered 2024-10-03: 1000 ug via INTRAMUSCULAR
  Filled 2024-10-03: qty 1

## 2024-10-03 MED ORDER — PROCHLORPERAZINE MALEATE 10 MG PO TABS: 10.0000 mg | ORAL_TABLET | Freq: Once | ORAL | Status: DC

## 2024-10-03 NOTE — Patient Instructions (Signed)

## 2024-10-03 NOTE — Progress Notes (Signed)
 Per [name] Heiliengoetter, PA, ok to treat w/ today's SCr. Continue w/ B12 inj.  Possibility that if pts SCr improves, pemetrexed  may be resumed. No Compazine  today as premed since pemetrexed  on hold per [name].  Karington Zarazua, Pharm.D., CPP 10/03/2024@3 :01 PM

## 2024-10-16 ENCOUNTER — Inpatient Hospital Stay: Admitting: Physician Assistant

## 2024-10-16 ENCOUNTER — Inpatient Hospital Stay

## 2024-10-23 ENCOUNTER — Inpatient Hospital Stay

## 2024-10-23 ENCOUNTER — Inpatient Hospital Stay: Admitting: Physician Assistant

## 2024-11-14 ENCOUNTER — Inpatient Hospital Stay

## 2024-11-14 ENCOUNTER — Inpatient Hospital Stay: Admitting: Internal Medicine

## 2024-11-14 ENCOUNTER — Inpatient Hospital Stay: Attending: Internal Medicine

## 2024-12-05 ENCOUNTER — Inpatient Hospital Stay

## 2024-12-05 ENCOUNTER — Inpatient Hospital Stay: Attending: Internal Medicine

## 2024-12-05 ENCOUNTER — Inpatient Hospital Stay: Admitting: Internal Medicine

## 2024-12-26 ENCOUNTER — Inpatient Hospital Stay

## 2024-12-26 ENCOUNTER — Inpatient Hospital Stay: Admitting: Internal Medicine
